# Patient Record
Sex: Male | Born: 1940 | Race: White | Hispanic: No | Marital: Married | Smoking: Former smoker
Health system: Southern US, Community
[De-identification: ages and names within clinical notes are randomized; demographics above are authoritative.]

## PROBLEM LIST (undated history)

## (undated) DIAGNOSIS — K4090 Unilateral inguinal hernia, without obstruction or gangrene, not specified as recurrent: Secondary | ICD-10-CM

## (undated) DIAGNOSIS — J40 Bronchitis, not specified as acute or chronic: Secondary | ICD-10-CM

## (undated) DIAGNOSIS — I4891 Unspecified atrial fibrillation: Secondary | ICD-10-CM

## (undated) DIAGNOSIS — R0602 Shortness of breath: Secondary | ICD-10-CM

## (undated) DIAGNOSIS — I10 Essential (primary) hypertension: Secondary | ICD-10-CM

## (undated) DIAGNOSIS — J449 Chronic obstructive pulmonary disease, unspecified: Secondary | ICD-10-CM

## (undated) DIAGNOSIS — IMO0001 Reserved for inherently not codable concepts without codable children: Secondary | ICD-10-CM

## (undated) DIAGNOSIS — M1990 Unspecified osteoarthritis, unspecified site: Secondary | ICD-10-CM

## (undated) DIAGNOSIS — Z5189 Encounter for other specified aftercare: Secondary | ICD-10-CM

## (undated) DIAGNOSIS — I714 Abdominal aortic aneurysm, without rupture, unspecified: Secondary | ICD-10-CM

## (undated) DIAGNOSIS — Z952 Presence of prosthetic heart valve: Secondary | ICD-10-CM

## (undated) DIAGNOSIS — Z9289 Personal history of other medical treatment: Secondary | ICD-10-CM

## (undated) DIAGNOSIS — I2510 Atherosclerotic heart disease of native coronary artery without angina pectoris: Secondary | ICD-10-CM

## (undated) HISTORY — PX: CHOLECYSTECTOMY: SHX55

## (undated) HISTORY — PX: GALLBLADDER SURGERY: SHX652

## (undated) HISTORY — PX: APPENDECTOMY: SHX54

## (undated) HISTORY — PX: CARDIAC VALVE REPLACEMENT: SHX585

## (undated) HISTORY — DX: Abdominal aortic aneurysm, without rupture, unspecified: I71.40

## (undated) HISTORY — PX: CARDIAC SURGERY: SHX584

## (undated) HISTORY — DX: Bronchitis, not specified as acute or chronic: J40

## (undated) HISTORY — PX: COLONOSCOPY: SHX174

## (undated) HISTORY — DX: Essential (primary) hypertension: I10

## (undated) HISTORY — PX: OTHER SURGICAL HISTORY: SHX169

## (undated) HISTORY — DX: Abdominal aortic aneurysm, without rupture: I71.4

## (undated) HISTORY — DX: Unspecified atrial fibrillation: I48.91

## (undated) HISTORY — DX: Chronic obstructive pulmonary disease, unspecified: J44.9

---

## 1983-01-19 HISTORY — PX: CORONARY ARTERY BYPASS GRAFT: SHX141

## 2005-01-18 HISTORY — PX: HERNIA REPAIR: SHX51

## 2010-12-14 ENCOUNTER — Ambulatory Visit: Payer: Self-pay

## 2011-03-24 ENCOUNTER — Encounter: Payer: Self-pay | Admitting: Cardiovascular Disease

## 2011-04-19 DIAGNOSIS — Z9289 Personal history of other medical treatment: Secondary | ICD-10-CM

## 2011-04-19 HISTORY — DX: Personal history of other medical treatment: Z92.89

## 2011-04-20 ENCOUNTER — Ambulatory Visit: Payer: Self-pay | Admitting: Cardiovascular Disease

## 2011-04-23 ENCOUNTER — Ambulatory Visit (INDEPENDENT_AMBULATORY_CARE_PROVIDER_SITE_OTHER): Payer: Medicare PPO | Admitting: Cardiovascular Disease

## 2011-04-23 ENCOUNTER — Encounter: Payer: Self-pay | Admitting: Cardiovascular Disease

## 2011-04-23 VITALS — BP 166/100 | HR 66 | Ht 70.0 in | Wt 190.0 lb

## 2011-04-23 DIAGNOSIS — Z954 Presence of other heart-valve replacement: Secondary | ICD-10-CM

## 2011-04-23 DIAGNOSIS — R0989 Other specified symptoms and signs involving the circulatory and respiratory systems: Secondary | ICD-10-CM

## 2011-04-23 DIAGNOSIS — I4891 Unspecified atrial fibrillation: Secondary | ICD-10-CM

## 2011-04-23 DIAGNOSIS — R0789 Other chest pain: Secondary | ICD-10-CM | POA: Insufficient documentation

## 2011-04-23 DIAGNOSIS — F17200 Nicotine dependence, unspecified, uncomplicated: Secondary | ICD-10-CM

## 2011-04-23 DIAGNOSIS — Z952 Presence of prosthetic heart valve: Secondary | ICD-10-CM

## 2011-04-23 NOTE — Patient Instructions (Signed)
Hold your warfarin through the weekend. Please go to Dr. Arlana Pouch on Monday for an INR check Please drop your warfarin to 6 mg everyday with 9 mg on thurs and Saturday  We have scheduled an echocardiogram for your aortic valve and atrial fib We have ordered a carotid ultrasound  Please call us if you have new issues that need to be addressed before your next appt.  Your physician wants you to follow-up in: 1 months.

## 2011-04-23 NOTE — Assessment & Plan Note (Signed)
Sharp chest pain symptoms at rest are somewhat atypical. He is at high risk of underlying coronary artery disease. We'll closely monitor him and if symptoms get worse, he may benefit from a stress test.

## 2011-04-23 NOTE — Assessment & Plan Note (Signed)
Echocardiogram ordered to evaluate his aortic valve. Initially placed in 1985. We'll try to obtain his previous echocardiogram for our records from 4 years ago.

## 2011-04-23 NOTE — Assessment & Plan Note (Signed)
Still in atrial fibrillation on today's visit despite initiation of amiodarone.. we have ordered an echocardiogram to evaluate his aortic valve, right heart pressures, mitral valve. He'll likely need cardioversion following the echocardiogram. We will set this up at his convenience.

## 2011-04-23 NOTE — Assessment & Plan Note (Signed)
We have encouraged him to continue to work on weaning his cigarettes and smoking cessation. He will continue to work on this and does not want any assistance with chantix.  

## 2011-04-23 NOTE — Assessment & Plan Note (Signed)
Mild bruit on exam. Reports having a strong family history of coronary artery disease. At his request, carotid ultrasound will be ordered.

## 2011-04-23 NOTE — Progress Notes (Signed)
Patient ID: Gregory Turner, male    DOB: 1940/03/15, 71 y.o.   MRN: [id number]  HPI Comments: 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 Dewaine Oats. He presents to establish care.  He reports that he started noticing shortness of breath at the end of February. He had some congestion, coughing. Blood pressure cuff showed his heart rate in the 130 range. He was seen by Dr. Arlana Pouch on March 6 and started on a meal around. Heart rate at that time was elevated at 132 beats per minute. Since then, he reports that he has felt relatively well. He has had improved shortness of breath, less cough. Amlodipine was changed to losartan 2 months ago and his edema has resolved. He does have continued shortness of breath now. Blood pressure at home he reports that sometimes mildly elevated but he does not remember the numbers. At Dr. Maree Krabbe office, blood pressure was in the 120-130 range. He does report having occasional atypical type sharp fleeting chest pain at rest. No significant chest pain with exertion. He is concerned about his carotid arteries reporting that a family member had blockage.  EKG today shows atrial fibrillation with rate 68 beats per minute, no significant ST or T wave changes INR today was greater than 5   Outpatient Encounter Prescriptions as of 04/23/2011  Medication Sig Dispense Refill  . amiodarone (PACERONE) 400 MG tablet Take 200 mg by mouth 2 (two) times daily.      Marland Kitchen losartan (COZAAR) 100 MG tablet Take 100 mg by mouth daily.      . metoprolol tartrate (LOPRESSOR) 25 MG tablet 1/2 tab po bid      . warfarin (COUMADIN) 6 MG tablet Take 6 mg by mouth as directed.        Review of Systems  Constitutional: Positive for fatigue.  HENT:  Negative.   Eyes: Negative.   Respiratory: Positive for cough and shortness of breath.   Cardiovascular: Negative.   Gastrointestinal: Negative.   Musculoskeletal: Negative.   Skin: Negative.   Neurological: Negative.   Hematological: Negative.   Psychiatric/Behavioral: Negative.   All other systems reviewed and are negative.    BP 166/100  Pulse 66  Ht 5\' 10"  (1.778 m)  Wt 190 lb (86.183 kg)  BMI 27.26 kg/m2  Physical Exam  Nursing note and vitals reviewed. Constitutional: He is oriented to person, place, and time. He appears well-developed and well-nourished.  HENT:  Head: Normocephalic.  Nose: Nose normal.  Mouth/Throat: Oropharynx is clear and moist.  Eyes: Conjunctivae are normal. Pupils are equal, round, and reactive to light.  Neck: Normal range of motion. Neck supple. No JVD present.  Cardiovascular: Normal rate, S1 normal, S2 normal and intact distal pulses.  An irregularly irregular rhythm present. Exam reveals no gallop and no friction rub.   Murmur heard.  Crescendo systolic murmur is present with a grade of 2/6       Audible click from his prosthetic valve  Pulmonary/Chest: Effort normal and breath sounds normal. No respiratory distress. He has no wheezes. He has no rales. He exhibits no tenderness.  Abdominal: Soft. Bowel sounds are normal. He exhibits no distension. There is no tenderness.  Musculoskeletal: Normal range of motion. He exhibits no edema and no tenderness.  Lymphadenopathy:    He has no cervical adenopathy.  Neurological: He is alert and oriented to person, place, and time. Coordination normal.  Skin: Skin is warm and dry. No rash noted. No erythema.  Psychiatric: He has a normal mood and affect. His behavior is normal. Judgment and thought content normal.           Assessment and Plan

## 2011-05-06 ENCOUNTER — Encounter (INDEPENDENT_AMBULATORY_CARE_PROVIDER_SITE_OTHER): Payer: Medicare PPO

## 2011-05-06 DIAGNOSIS — R0989 Other specified symptoms and signs involving the circulatory and respiratory systems: Secondary | ICD-10-CM

## 2011-05-06 DIAGNOSIS — I6529 Occlusion and stenosis of unspecified carotid artery: Secondary | ICD-10-CM

## 2011-05-11 ENCOUNTER — Other Ambulatory Visit: Payer: Self-pay | Admitting: Cardiology

## 2011-05-11 ENCOUNTER — Other Ambulatory Visit (INDEPENDENT_AMBULATORY_CARE_PROVIDER_SITE_OTHER): Payer: Medicare PPO

## 2011-05-11 DIAGNOSIS — R0789 Other chest pain: Secondary | ICD-10-CM

## 2011-05-11 DIAGNOSIS — I714 Abdominal aortic aneurysm, without rupture, unspecified: Secondary | ICD-10-CM

## 2011-05-11 DIAGNOSIS — I4891 Unspecified atrial fibrillation: Secondary | ICD-10-CM

## 2011-05-14 ENCOUNTER — Encounter (INDEPENDENT_AMBULATORY_CARE_PROVIDER_SITE_OTHER): Payer: Medicare PPO

## 2011-05-14 DIAGNOSIS — I714 Abdominal aortic aneurysm, without rupture, unspecified: Secondary | ICD-10-CM

## 2011-05-17 ENCOUNTER — Encounter: Payer: Self-pay | Admitting: Cardiovascular Disease

## 2011-05-17 ENCOUNTER — Ambulatory Visit (INDEPENDENT_AMBULATORY_CARE_PROVIDER_SITE_OTHER): Payer: Medicare PPO | Admitting: Cardiovascular Disease

## 2011-05-17 VITALS — BP 124/86 | HR 72 | Ht 70.0 in | Wt 189.0 lb

## 2011-05-17 DIAGNOSIS — Z952 Presence of prosthetic heart valve: Secondary | ICD-10-CM

## 2011-05-17 DIAGNOSIS — I4891 Unspecified atrial fibrillation: Secondary | ICD-10-CM

## 2011-05-17 DIAGNOSIS — I714 Abdominal aortic aneurysm, without rupture, unspecified: Secondary | ICD-10-CM

## 2011-05-17 DIAGNOSIS — F17200 Nicotine dependence, unspecified, uncomplicated: Secondary | ICD-10-CM

## 2011-05-17 DIAGNOSIS — E785 Hyperlipidemia, unspecified: Secondary | ICD-10-CM

## 2011-05-17 DIAGNOSIS — Z954 Presence of other heart-valve replacement: Secondary | ICD-10-CM

## 2011-05-17 MED ORDER — ATORVASTATIN CALCIUM 10 MG PO TABS: 10.0000 mg | ORAL_TABLET | Freq: Every day | ORAL | Status: DC

## 2011-05-17 NOTE — Assessment & Plan Note (Signed)
Recent echocardiogram shows well functioning aortic valve prosthesis.

## 2011-05-17 NOTE — Progress Notes (Signed)
Patient ID: Gregory Turner, male    DOB: 08-13-1940, 71 y.o.   MRN: [id number]  HPI Comments: 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 Dewaine Oats. On his last clinic visit we tried continuing metoprolol and amiodarone for pharmacologic cardioversion. He did not have any significant symptoms and was taking warfarin for his prosthetic mechanical valve.  He presents today and reports that overall he feels well. He denies any significant chest pain or shortness of breath. He reports that after starting amiodarone, his INR increased to 5. He was playing golf and hit the ground with his club, injuring his left forearm, with resulting hematoma. This is now resolving. Recent INR was 2.66.  Recent echocardiogram showed normal LV function, mildly dilated left atrium, normal functioning aortic valve mechanical valve. Incidentally, infrarenal abdominal aortic aneurysm was found estimated at 5.9 cm. He has a strong family history of aneurysm, with a brother who had AAA rupture, mother with AAA. Most of the family smokes.  No known cardiac history apart from recent arrhythmia. Blood pressure has improved on his current medication regimen.  EKG today shows atrial fibrillation with rate 71 beats per minute, no significant ST or T wave changes   Outpatient Encounter Prescriptions as of 05/17/2011  Medication Sig Dispense Refill  . amiodarone (PACERONE) 400 MG tablet Take 200 mg by mouth 2 (two) times daily.      Marland Kitchen losartan (COZAAR) 100 MG tablet Take 100 mg by mouth daily.      . metoprolol tartrate (LOPRESSOR) 25 MG tablet 1/2 tab po bid      . warfarin (COUMADIN) 6 MG tablet Take 6 mg by mouth as directed.       Review of  Systems  Constitutional: Positive for fatigue.  HENT: Negative.   Eyes: Negative.   Cardiovascular: Negative.   Gastrointestinal: Negative.   Musculoskeletal: Negative.   Skin: Negative.   Neurological: Negative.   Hematological: Negative.   Psychiatric/Behavioral: Negative.   All other systems reviewed and are negative.    BP 124/86  Pulse 72  Ht 5\' 10"  (1.778 m)  Wt 189 lb (85.73 kg)  BMI 27.12 kg/m2  Physical Exam  Nursing note and vitals reviewed. Constitutional: He is oriented to person, place, and time. He appears well-developed and well-nourished.  HENT:  Head: Normocephalic.  Nose: Nose normal.  Mouth/Throat: Oropharynx is clear and moist.  Eyes: Conjunctivae are normal. Pupils are equal, round, and reactive to light.  Neck: Normal range of motion. Neck supple. No JVD present.  Cardiovascular: Normal rate, S1 normal, S2 normal and intact distal pulses.  An irregularly irregular rhythm present. Exam reveals no gallop and no friction rub.   Murmur heard.  Crescendo systolic murmur is present with a grade of 2/6       Audible click from his prosthetic valve  Pulmonary/Chest: Effort normal and breath sounds normal. No respiratory distress. He has no wheezes. He has no rales. He exhibits no tenderness.  Abdominal: Soft. Bowel sounds are normal. He exhibits no distension. There is no tenderness.  Musculoskeletal: Normal range of motion. He exhibits no edema and no tenderness.  Lymphadenopathy:    He has no cervical adenopathy.  Neurological: He is alert and oriented  to person, place, and time. Coordination normal.  Skin: Skin is warm and dry. No rash noted. No erythema.  Psychiatric: He has a normal mood and affect. His behavior is normal. Judgment and thought content normal.           Assessment and Plan         

## 2011-05-17 NOTE — Assessment & Plan Note (Signed)
We have encouraged him to continue to work on weaning his cigarettes and smoking cessation. He will continue to work on this and does not want any assistance with chantix.  

## 2011-05-17 NOTE — Assessment & Plan Note (Signed)
We will start Lipitor 10 mg daily. Goal LDL less than 70.

## 2011-05-17 NOTE — Patient Instructions (Signed)
Please start lipitor 10 mg once a day We have scheduled an appt with Dr. Myra Gianotti in [place] for your ABD aortic aneurysm  Please call us if you have new issues that need to be addressed before your next appt.  Your physician wants you to follow-up in: 2 months.

## 2011-05-17 NOTE — Assessment & Plan Note (Addendum)
Infrarenal abdominal aortic aneurysm estimated close to 6 cm. He is very concerned given his strong family history of AAA, brother with aneurysm rupture who lived. We will refer him to vascular surgery for further evaluation. He has indicated he would like to be seen in [place] . He does have a history of surgery to the abdomen, likely significant scar tissue. Uncertain if he is a candidate for endovascular stenting. We'll defer on ordering a abdomen and pelvis CT scan to evaluate the AAA and will let vascular surgery order this in [place] (uncertain if high-resolution 1 mm cuts are needed).

## 2011-05-17 NOTE — Assessment & Plan Note (Signed)
Rate is well controlled. Appears to be atrial flutter. We'll continue him on his current medication regimen and consider cardioversion after his abdominal aortic aneurysm repair. He would like to wait until after the aneurysm has been repaired.

## 2011-05-18 ENCOUNTER — Encounter: Payer: Self-pay | Admitting: Vascular Surgery

## 2011-05-18 ENCOUNTER — Other Ambulatory Visit: Payer: Self-pay | Admitting: Cardiovascular Disease

## 2011-05-18 DIAGNOSIS — I714 Abdominal aortic aneurysm, without rupture, unspecified: Secondary | ICD-10-CM

## 2011-05-19 ENCOUNTER — Encounter: Payer: Self-pay | Admitting: Vascular Surgery

## 2011-05-19 ENCOUNTER — Encounter: Payer: Self-pay | Admitting: Cardiovascular Disease

## 2011-05-19 ENCOUNTER — Ambulatory Visit (INDEPENDENT_AMBULATORY_CARE_PROVIDER_SITE_OTHER): Payer: Medicare PPO | Admitting: Vascular Surgery

## 2011-05-19 ENCOUNTER — Encounter (HOSPITAL_COMMUNITY): Payer: Self-pay | Admitting: Pharmacy Technician

## 2011-05-19 ENCOUNTER — Other Ambulatory Visit: Payer: Self-pay

## 2011-05-19 VITALS — BP 142/93 | HR 69 | Temp 98.1°F | Ht 70.0 in | Wt 191.0 lb

## 2011-05-19 DIAGNOSIS — I714 Abdominal aortic aneurysm, without rupture, unspecified: Secondary | ICD-10-CM | POA: Insufficient documentation
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
## 2011-05-23 MED ORDER — SODIUM CHLORIDE 0.9 % IV SOLN: INTRAVENOUS | Status: DC

## 2011-05-24 ENCOUNTER — Ambulatory Visit (HOSPITAL_COMMUNITY)
Admission: RE | Admit: 2011-05-24 | Discharge: 2011-05-24 | Disposition: A | Discharge status: Home / Self Care | Payer: Medicare PPO | Source: Ambulatory Visit | Attending: Vascular Surgery | Admitting: Vascular Surgery

## 2011-05-24 ENCOUNTER — Encounter (HOSPITAL_COMMUNITY)
Admission: RE | Disposition: A | Discharge status: Home / Self Care | Payer: Self-pay | Source: Ambulatory Visit | Attending: Vascular Surgery

## 2011-05-24 ENCOUNTER — Ambulatory Visit: Payer: Medicare PPO | Admitting: Cardiovascular Disease

## 2011-05-24 ENCOUNTER — Other Ambulatory Visit: Payer: Self-pay | Admitting: *Deleted

## 2011-05-24 DIAGNOSIS — I714 Abdominal aortic aneurysm, without rupture, unspecified: Secondary | ICD-10-CM | POA: Insufficient documentation

## 2011-05-24 DIAGNOSIS — R791 Abnormal coagulation profile: Secondary | ICD-10-CM | POA: Insufficient documentation

## 2011-05-24 DIAGNOSIS — Z5309 Procedure and treatment not carried out because of other contraindication: Secondary | ICD-10-CM | POA: Insufficient documentation

## 2011-05-24 LAB — PROTIME-INR
INR: 2.03 — ABNORMAL HIGH (ref 0.00–1.49)
Prothrombin Time: 23.3 seconds — ABNORMAL HIGH (ref 11.6–15.2)

## 2011-05-24 LAB — POCT I-STAT, CHEM 8
Calcium, Ion: 1.2 mmol/L (ref 1.12–1.32)
Chloride: 106 mEq/L (ref 96–112)
Glucose, Bld: 103 mg/dL — ABNORMAL HIGH (ref 70–99)
HCT: 43 % (ref 39.0–52.0)
Hemoglobin: 14.6 g/dL (ref 13.0–17.0)

## 2011-05-24 LAB — APTT: aPTT: 46 seconds — ABNORMAL HIGH (ref 24–37)

## 2011-05-24 SURGERY — ABDOMINAL AORTAGRAM
Anesthesia: LOCAL

## 2011-05-24 NOTE — Progress Notes (Signed)
Pt's surgery has ben cancelled today due to high INR.  Pt has talked with cath Lab Rn and Dr Edilia Bo will reschedule his procedure for a later date.  PTs IV was removed pt and his wife left

## 2011-05-24 NOTE — Progress Notes (Signed)
Notified Dr Edilia Bo that patient's INR 2.03, last Coumadin dose was 1/2 dose on 05/20/2011, procedure cancelled per Dr Edilia Bo, instructed patient to resume coumadin today and physician office will call to reschedule.

## 2011-05-25 ENCOUNTER — Encounter (HOSPITAL_COMMUNITY): Payer: Self-pay | Admitting: Respiratory Therapy

## 2011-05-26 ENCOUNTER — Ambulatory Visit (HOSPITAL_COMMUNITY): Admission: RE | Admit: 2011-05-26 | Payer: Medicare PPO | Source: Ambulatory Visit | Admitting: Vascular Surgery

## 2011-05-26 ENCOUNTER — Other Ambulatory Visit: Payer: Self-pay | Admitting: *Deleted

## 2011-05-26 ENCOUNTER — Encounter (HOSPITAL_COMMUNITY): Admission: RE | Payer: Self-pay | Source: Ambulatory Visit

## 2011-05-26 SURGERY — ABDOMINAL AORTAGRAM
Anesthesia: LOCAL
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
## 2011-05-26 NOTE — Interval H&P Note (Signed)
History and Physical Interval Note:  05/26/2011 10:48 AM  Gregory Turner  has presented today for surgery, with the diagnosis of pvd  The various methods of treatment have been discussed with the patient and family. After consideration of risks, benefits and other options for treatment, the patient has consented to  Procedure(s) (LRB): ABDOMINAL AORTAGRAM (N/A) as a surgical intervention .  The patients' history has been reviewed, patient examined, no change in status, stable for surgery.  I have reviewed the patients' chart and labs.  Questions were answered to the patient's satisfaction.     [name]

## 2011-05-28 ENCOUNTER — Ambulatory Visit (HOSPITAL_COMMUNITY)
Admission: RE | Admit: 2011-05-28 | Discharge: 2011-05-28 | Disposition: A | Discharge status: Home / Self Care | Payer: Medicare PPO | Source: Ambulatory Visit | Attending: Vascular Surgery | Admitting: Vascular Surgery

## 2011-05-28 ENCOUNTER — Encounter (HOSPITAL_COMMUNITY)
Admission: RE | Disposition: A | Discharge status: Home / Self Care | Payer: Self-pay | Source: Ambulatory Visit | Attending: Vascular Surgery

## 2011-05-28 DIAGNOSIS — F17200 Nicotine dependence, unspecified, uncomplicated: Secondary | ICD-10-CM | POA: Insufficient documentation

## 2011-05-28 DIAGNOSIS — Z7901 Long term (current) use of anticoagulants: Secondary | ICD-10-CM | POA: Insufficient documentation

## 2011-05-28 DIAGNOSIS — Z79899 Other long term (current) drug therapy: Secondary | ICD-10-CM | POA: Insufficient documentation

## 2011-05-28 DIAGNOSIS — I714 Abdominal aortic aneurysm, without rupture, unspecified: Secondary | ICD-10-CM

## 2011-05-28 DIAGNOSIS — Z954 Presence of other heart-valve replacement: Secondary | ICD-10-CM | POA: Insufficient documentation

## 2011-05-28 DIAGNOSIS — I10 Essential (primary) hypertension: Secondary | ICD-10-CM | POA: Insufficient documentation

## 2011-05-28 DIAGNOSIS — I4891 Unspecified atrial fibrillation: Secondary | ICD-10-CM | POA: Insufficient documentation

## 2011-05-28 DIAGNOSIS — J4489 Other specified chronic obstructive pulmonary disease: Secondary | ICD-10-CM | POA: Insufficient documentation

## 2011-05-28 DIAGNOSIS — J449 Chronic obstructive pulmonary disease, unspecified: Secondary | ICD-10-CM | POA: Insufficient documentation

## 2011-05-28 HISTORY — PX: ABDOMINAL AORTAGRAM: SHX5454

## 2011-05-28 LAB — POCT I-STAT, CHEM 8
BUN: 17 mg/dL (ref 6–23)
Calcium, Ion: 1.17 mmol/L (ref 1.12–1.32)
Creatinine, Ser: 1.2 mg/dL (ref 0.50–1.35)
Glucose, Bld: 101 mg/dL — ABNORMAL HIGH (ref 70–99)
Hemoglobin: 15 g/dL (ref 13.0–17.0)
Sodium: 139 mEq/L (ref 135–145)
TCO2: 23 mmol/L (ref 0–100)

## 2011-05-28 LAB — APTT: aPTT: 35 seconds (ref 24–37)

## 2011-05-28 SURGERY — ABDOMINAL AORTAGRAM
Anesthesia: LOCAL

## 2011-05-28 MED ORDER — ONDANSETRON HCL 4 MG/2ML IJ SOLN: 4.0000 mg | Freq: Four times a day (QID) | INTRAMUSCULAR | Status: DC | PRN

## 2011-05-28 MED ORDER — ACETAMINOPHEN 325 MG PO TABS: 650.0000 mg | ORAL_TABLET | ORAL | Status: DC | PRN

## 2011-05-28 MED ORDER — MIDAZOLAM HCL 2 MG/2ML IJ SOLN
INTRAMUSCULAR | Status: AC
  Filled 2011-05-28: qty 2

## 2011-05-28 MED ORDER — LIDOCAINE HCL (PF) 1 % IJ SOLN
INTRAMUSCULAR | Status: AC
  Filled 2011-05-28: qty 30

## 2011-05-28 MED ORDER — SODIUM CHLORIDE 0.9 % IV SOLN
INTRAVENOUS | Status: DC
  Administered 2011-05-28: 07:00:00 via INTRAVENOUS

## 2011-05-28 MED ORDER — HEPARIN (PORCINE) IN NACL 2-0.9 UNIT/ML-% IJ SOLN
INTRAMUSCULAR | Status: AC
  Filled 2011-05-28: qty 1000

## 2011-05-28 MED ORDER — SODIUM CHLORIDE 0.9 % IV SOLN: 1.0000 mL/kg/h | INTRAVENOUS | Status: DC

## 2011-05-28 MED ORDER — FENTANYL CITRATE 0.05 MG/ML IJ SOLN
INTRAMUSCULAR | Status: AC
  Filled 2011-05-28: qty 2

## 2011-05-28 NOTE — Op Note (Signed)
PATIENT: Gregory Turner   MRN: [id number] DOB: 09-18-1940    DATE OF PROCEDURE: 05/28/2011  INDICATIONS: Gregory Turner is a 71 y.o. male 5.5 cm infrarenal abdominal aortic aneurysm. He is brought in for diagnostic arteriography in order to plan elective repair.  PROCEDURE:  1. Ultrasound-guided access to the right common femoral artery 2. Aortogram with bilateral iliac arteriogram and bilateral lower extremity runoff 3. Perclose right common femoral artery  SURGEON: Di Kindle. Edilia Bo, MD, FACS  ANESTHESIA: local with sedation   EBL: minimal  TECHNIQUE: The patient was brought to the PV lap and received 1 mg of Versed and 50 mcg of fentanyl. The right groin was prepped and draped in the usual sterile fashion. After the skin was infiltrated with 1% lidocaine, and under ultrasound guidance, the right common femoral artery was cannulated and a guidewire introduced into the infrarenal aorta under fluoroscopic control. A 5 French sheath was introduced over the wire. The pigtail catheter was positioned at the L1 vertebral body and flush aortogram obtained. A lateral projection was then obtained. The catheter was then pulled down above the aortic bifurcation and oblique iliac projections were obtained. Next bilateral lower extremity runoff films were obtained.  At the completion the 5 French sheath was exchanged for the Perclose device. The artery was closed using the Perclose device with good hemostasis noted. No immediate complications were noted.  FINDINGS:  1. Infrarenal abdominal aortic aneurysm (the size of which cannot be determined by this study). No significant renal artery disease or disease of the superior mesenteric or celiac axis. 2. Patent common iliac, external iliac arteries, and hypogastric arteries bilaterally. 3. Patent common femoral, deep femoral, superficial femoral, popliteal, posterior tibial, and peroneal arteries bilaterally. Anterior tibial arteries are occluded  bilaterally.  Waverly Ferrari, MD, FACS [facility]  DATE OF DICTATION:   05/28/2011

## 2011-05-28 NOTE — Progress Notes (Signed)
Pt walked in hallway without difficulty. Right groin dressing remains CDI, site unremarkable.

## 2011-05-28 NOTE — Interval H&P Note (Signed)
History and Physical Interval Note:  05/28/2011 1:49 PM  Gregory Turner  has presented today for surgery, with the diagnosis of Abdominal Aneursym  The various methods of treatment have been discussed with the patient and family. After consideration of risks, benefits and other options for treatment, the patient has consented to: ABDOMINAL AORTAGRAM (N/A) as a surgical intervention .  The patients' history has been reviewed, patient examined, no change in status, stable for surgery.  I have reviewed the patients' chart and labs.  Questions were answered to the patient's satisfaction.     Lakhia Gengler S

## 2011-05-28 NOTE — Discharge Instructions (Signed)
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
## 2011-06-01 ENCOUNTER — Encounter: Payer: Self-pay | Admitting: Vascular Surgery

## 2011-06-02 ENCOUNTER — Ambulatory Visit (INDEPENDENT_AMBULATORY_CARE_PROVIDER_SITE_OTHER): Payer: Medicare PPO | Admitting: Vascular Surgery

## 2011-06-02 ENCOUNTER — Encounter: Payer: Self-pay | Admitting: Vascular Surgery

## 2011-06-02 VITALS — BP 165/101 | HR 53 | Resp 18 | Ht 70.0 in | Wt 190.0 lb

## 2011-06-02 DIAGNOSIS — I714 Abdominal aortic aneurysm, without rupture, unspecified: Secondary | ICD-10-CM

## 2011-06-02 NOTE — Progress Notes (Signed)
Vascular and Vein Specialist of [place]  Patient name: Gregory Turner MRN: [id number] DOB: 04/26/1940 Sex: male  REASON FOR ADMISSION: for elective repair of 5.9 cm infrarenal abdominal aortic aneurysm.  HPI: Gregory Turner is a 71 y.o. male who was recently diagnosed with atrial fibrillation. His workup included an ultrasound of the abdomen which showed a 5.9 cm infrarenal abdominal aortic aneurysm. He was sent for vascular consultation area Dr. Gollan planned on holding off on and at lesion procedure for his atrial fibrillation and so the aneurysm was addressed. CT scan done in  [place] showed a 5.5 cm infrarenal abdominal aortic aneurysm. Patient has not had any abdominal pain or back pain. He has undergone workup including an aortogram which shows he appears to be a reasonable candidate for endovascular repair of his aneurysm.  Past Medical History  Diagnosis Date  . HTN (hypertension)   . COPD (chronic obstructive pulmonary disease)   . Bronchitis   . Afib     Family History  Problem Relation Age of Onset  . Hypertension Mother   . Cancer Brother     lung  . Deep vein thrombosis Brother   . Other Brother     AAA  . Heart disease Father   . Heart attack Father     SOCIAL HISTORY: History  Substance Use Topics  . Smoking status: Current Everyday Smoker -- 1.0 packs/day for 53 years    Types: Cigarettes  . Smokeless tobacco: Never Used  . Alcohol Use: No    Allergies  Allergen Reactions  . Sulfa Drugs Cross Reactors Other (See Comments)    Makes patient feel faint    Current Outpatient Prescriptions  Medication Sig Dispense Refill  . acetaminophen (TYLENOL) 500 MG tablet Take 500 mg by mouth every 6 (six) hours as needed. For pain      . albuterol (PROVENTIL HFA;VENTOLIN HFA) 108 (90 BASE) MCG/ACT inhaler Inhale 2 puffs into the lungs every 6 (six) hours as needed. For shortness of breath      . amiodarone (PACERONE) 200 MG tablet Take 100 mg by  mouth 2 (two) times daily.      . atorvastatin (LIPITOR) 10 MG tablet Take 10 mg by mouth daily.      . losartan (COZAAR) 100 MG tablet Take 100 mg by mouth daily.      . metoprolol tartrate (LOPRESSOR) 25 MG tablet Take 12.5 mg by mouth 2 (two) times daily.       . warfarin (COUMADIN) 6 MG tablet Take 6-9 mg by mouth daily. Take 6mg on day 1, 9mg on day 2, 6mg on day 3 and continue accordingly        REVIEW OF SYSTEMS: [X ] denotes positive finding; [  ] denotes negative finding CARDIOVASCULAR:  [ ] chest pain   [ ] chest pressure   [ ] palpitations   [ ] orthopnea   [ ] dyspnea on exertion   [ ] claudication   [ ] rest pain   [ ] DVT   [ ] phlebitis PULMONARY:   [ ] productive cough   [ ] asthma   [ ] wheezing NEUROLOGIC:   [ ] weakness  [ ] paresthesias  [ ] aphasia  [ ] amaurosis  [ ] dizziness HEMATOLOGIC:   [ ] bleeding problems   [ ] clotting disorders MUSCULOSKELETAL:  [ ] joint pain   [ ] joint swelling [ ] leg swelling GASTROINTESTINAL: [ ]  blood in stool  [ ]    hematemesis GENITOURINARY:  [ ]  dysuria  [ ]  hematuria PSYCHIATRIC:  [ ] history of major depression INTEGUMENTARY:  [ ] rashes  [ ] ulcers CONSTITUTIONAL:  [ ] fever   [ ] chills  PHYSICAL EXAM: Filed Vitals:   06/02/11 1034  BP: 165/101  Pulse: 53  Resp: 18  Height: 5' 10" (1.778 m)  Weight: 190 lb (86.183 kg)   Body mass index is 27.26 kg/(m^2). GENERAL: The patient is a well-nourished male, in no acute distress. The vital signs are documented above. CARDIOVASCULAR: There is a regular rate and rhythm without significant murmur appreciated. I do not detect carotid bruits. He has palpable femoral, popliteal, and posterior tibial pulses bilaterally. He has no significant lower extremity swelling. PULMONARY: There is good air exchange bilaterally without wheezing or rales. ABDOMEN: Soft and non-tender with normal pitched bowel sounds. His aneurysm is palpable and nontender. MUSCULOSKELETAL: There are no major  deformities or cyanosis. NEUROLOGIC: No focal weakness or paresthesias are detected. SKIN: There are no ulcers or rashes noted. PSYCHIATRIC: The patient has a normal affect.  DATA:  I have reviewed his CT scan which shows a 5.4 cm infrarenal abdominal aortic aneurysm. The neck of the aneurysm is approximately 30 mm in diameter a centimeter and a half distal to the lowest renal on the left. I've also reviewed his arteriogram which shows it appears to be a reasonable candidate for EVAR.  MEDICAL ISSUES: He presents for elective EVAR of his 5.5 cm infrarenal abdominal aortic aneurysm. I have discussed the indications for aneurysm repair. I have explained that without repair, the risk of the aneurysm rupturing is 5-10% per year. We have discussed the advantages and disadvantages of open versus endovascular repair. The patient wishes to proceed with endovascular aneurysm repair (EVAR). I have discussed the potential complications of EVAR, including, but not limited to: bleeding, infection, arterial injury, graft migration, endoleak, renal failure, MI or other unpredictable medical problems. We have discussed the possibility of having to convert to open repair. We also discussed the need for continued lifelong follow-up after EVAR. All of the patients questions were answered and they are agreeable to proceed with surgery. We will schedule his surgery within the next few weeks. His Coumadin will be held preoperatively.  Lowry Bala S [facility] [place] Office: [phone]  

## 2011-06-07 ENCOUNTER — Other Ambulatory Visit: Payer: Self-pay | Admitting: *Deleted

## 2011-06-09 ENCOUNTER — Encounter (HOSPITAL_COMMUNITY): Payer: Self-pay | Admitting: Pharmacy Technician

## 2011-06-15 ENCOUNTER — Inpatient Hospital Stay (HOSPITAL_COMMUNITY): Admission: RE | Admit: 2011-06-15 | Discharge: 2011-06-15 | Payer: Medicare PPO | Source: Ambulatory Visit

## 2011-06-15 ENCOUNTER — Encounter (HOSPITAL_COMMUNITY): Payer: Self-pay

## 2011-06-15 HISTORY — DX: Encounter for other specified aftercare: Z51.89

## 2011-06-15 HISTORY — DX: Shortness of breath: R06.02

## 2011-06-15 HISTORY — DX: Unspecified osteoarthritis, unspecified site: M19.90

## 2011-06-15 HISTORY — DX: Personal history of other medical treatment: Z92.89

## 2011-06-15 HISTORY — DX: Presence of prosthetic heart valve: Z95.2

## 2011-06-15 HISTORY — DX: Unilateral inguinal hernia, without obstruction or gangrene, not specified as recurrent: K40.90

## 2011-06-15 HISTORY — DX: Reserved for inherently not codable concepts without codable children: IMO0001

## 2011-06-15 NOTE — Pre-Procedure Instructions (Signed)
20 Charley Miske  06/15/2011   Your procedure is scheduled on:  06/22/2011  Report to [facility] at 5:30 AM.  Call this number if you have problems the morning of surgery: [phone]   Remember:   Do not eat food:After Midnight.  May have clear liquids: up to 4 Hours before arrival.  NOTHING AFTER 1:30a.m.  Clear liquids include soda, tea, black coffee, apple or grape juice, broth.  Take these medicines the morning of surgery with A SIP OF WATER: amiodarone, metoprolol    Do not wear jewelry, make-up or nail polish.  Do not wear lotions, powders, or perfumes. You may wear deodorant.  Do not shave 48 hours prior to surgery. Men may shave face and neck.  Do not bring valuables to the hospital.  Contacts, dentures or bridgework may not be worn into surgery.  Leave suitcase in the car. After surgery it may be brought to your room.  For patients admitted to the hospital, checkout time is 11:00 AM the day of discharge.   Patients discharged the day of surgery will not be allowed to drive home.  Name and phone number of your driver: /w wife   Special Instructions: CHG Shower Use Special Wash: 1/2 bottle night before surgery and 1/2 bottle morning of surgery.   Please read over the following fact sheets that you were given: Pain Booklet, Coughing and Deep Breathing, Blood Transfusion Information, MRSA Information and Surgical Site Infection Prevention

## 2011-06-17 ENCOUNTER — Encounter (HOSPITAL_COMMUNITY)
Admission: RE | Admit: 2011-06-17 | Discharge: 2011-06-17 | Disposition: A | Discharge status: Home / Self Care | Payer: Medicare PPO | Source: Ambulatory Visit | Attending: Vascular Surgery | Admitting: Vascular Surgery

## 2011-06-17 LAB — URINE MICROSCOPIC-ADD ON

## 2011-06-17 LAB — BLOOD GAS, ARTERIAL
Bicarbonate: 22.2 mEq/L (ref 20.0–24.0)
FIO2: 0.21 %
O2 Saturation: 97.4 %
TCO2: 23.3 mmol/L (ref 0–100)
pH, Arterial: 7.407 (ref 7.350–7.450)
pO2, Arterial: 88 mmHg (ref 80.0–100.0)

## 2011-06-17 LAB — CBC
HCT: 42 % (ref 39.0–52.0)
Hemoglobin: 14.4 g/dL (ref 13.0–17.0)
RBC: 4.44 MIL/uL (ref 4.22–5.81)

## 2011-06-17 LAB — COMPREHENSIVE METABOLIC PANEL
ALT: 12 U/L (ref 0–53)
Alkaline Phosphatase: 71 U/L (ref 39–117)
BUN: 14 mg/dL (ref 6–23)
CO2: 20 mEq/L (ref 19–32)
GFR calc Af Amer: >90 mL/min (ref >90)
GFR calc non Af Amer: 81 mL/min — ABNORMAL LOW (ref >90)
Glucose, Bld: 96 mg/dL (ref 70–99)
Potassium: 4.4 mEq/L (ref 3.5–5.1)
Sodium: 137 mEq/L (ref 135–145)
Total Bilirubin: 0.5 mg/dL (ref 0.3–1.2)

## 2011-06-17 LAB — URINALYSIS, ROUTINE W REFLEX MICROSCOPIC
Bilirubin Urine: NEGATIVE
Glucose, UA: NEGATIVE mg/dL
Ketones, ur: NEGATIVE mg/dL
Leukocytes, UA: NEGATIVE
pH: 5.5 (ref 5.0–8.0)

## 2011-06-17 LAB — SURGICAL PCR SCREEN: Staphylococcus aureus: NEGATIVE

## 2011-06-17 LAB — TYPE AND SCREEN: ABO/RH(D): B POS

## 2011-06-21 ENCOUNTER — Other Ambulatory Visit: Payer: Self-pay | Admitting: Vascular Surgery

## 2011-06-21 LAB — PROTIME-INR: Prothrombin Time: 16.1 seconds — ABNORMAL HIGH (ref 11.6–15.2)

## 2011-06-21 MED ORDER — SODIUM CHLORIDE 0.9 % IV SOLN: INTRAVENOUS | Status: DC

## 2011-06-21 MED ORDER — DEXTROSE 5 % IV SOLN
1.5000 g | INTRAVENOUS | Status: AC
  Administered 2011-06-22: 1.5 g via INTRAVENOUS
  Filled 2011-06-21: qty 1.5

## 2011-06-22 ENCOUNTER — Ambulatory Visit (HOSPITAL_COMMUNITY): Payer: Medicare PPO

## 2011-06-22 ENCOUNTER — Encounter (HOSPITAL_COMMUNITY): Payer: Self-pay | Admitting: Anesthesiology

## 2011-06-22 ENCOUNTER — Inpatient Hospital Stay (HOSPITAL_COMMUNITY): Payer: Medicare PPO

## 2011-06-22 ENCOUNTER — Inpatient Hospital Stay (HOSPITAL_COMMUNITY)
Admission: RE | Admit: 2011-06-22 | Discharge: 2011-06-23 | DRG: 238 | Disposition: A | Discharge status: Home / Self Care | Payer: Medicare PPO | Source: Ambulatory Visit | Attending: Vascular Surgery | Admitting: Vascular Surgery

## 2011-06-22 ENCOUNTER — Encounter (HOSPITAL_COMMUNITY): Payer: Self-pay | Admitting: Surgery

## 2011-06-22 ENCOUNTER — Ambulatory Visit (HOSPITAL_COMMUNITY): Payer: Medicare PPO | Admitting: Anesthesiology

## 2011-06-22 ENCOUNTER — Encounter (HOSPITAL_COMMUNITY)
Admission: RE | Disposition: A | Discharge status: Home / Self Care | Payer: Self-pay | Source: Ambulatory Visit | Attending: Vascular Surgery

## 2011-06-22 DIAGNOSIS — Z01812 Encounter for preprocedural laboratory examination: Secondary | ICD-10-CM

## 2011-06-22 DIAGNOSIS — I714 Abdominal aortic aneurysm, without rupture, unspecified: Principal | ICD-10-CM | POA: Diagnosis present

## 2011-06-22 DIAGNOSIS — I4891 Unspecified atrial fibrillation: Secondary | ICD-10-CM | POA: Diagnosis present

## 2011-06-22 DIAGNOSIS — J4489 Other specified chronic obstructive pulmonary disease: Secondary | ICD-10-CM | POA: Diagnosis present

## 2011-06-22 DIAGNOSIS — I10 Essential (primary) hypertension: Secondary | ICD-10-CM | POA: Diagnosis present

## 2011-06-22 DIAGNOSIS — F17200 Nicotine dependence, unspecified, uncomplicated: Secondary | ICD-10-CM | POA: Diagnosis present

## 2011-06-22 DIAGNOSIS — J449 Chronic obstructive pulmonary disease, unspecified: Secondary | ICD-10-CM | POA: Diagnosis present

## 2011-06-22 DIAGNOSIS — Z954 Presence of other heart-valve replacement: Secondary | ICD-10-CM

## 2011-06-22 HISTORY — PX: ABDOMINAL AORTIC ANEURYSM REPAIR: SUR1152

## 2011-06-22 HISTORY — PX: ENDOVASCULAR STENT INSERTION: SHX5161

## 2011-06-22 LAB — BASIC METABOLIC PANEL
BUN: 10 mg/dL (ref 6–23)
CO2: 24 mEq/L (ref 19–32)
Calcium: 8.2 mg/dL — ABNORMAL LOW (ref 8.4–10.5)
Creatinine, Ser: 0.77 mg/dL (ref 0.50–1.35)
Glucose, Bld: 108 mg/dL — ABNORMAL HIGH (ref 70–99)
Sodium: 136 mEq/L (ref 135–145)

## 2011-06-22 LAB — CBC
HCT: 38.6 % — ABNORMAL LOW (ref 39.0–52.0)
Hemoglobin: 13 g/dL (ref 13.0–17.0)
MCH: 32 pg (ref 26.0–34.0)
MCV: 95.1 fL (ref 78.0–100.0)
RBC: 4.06 MIL/uL — ABNORMAL LOW (ref 4.22–5.81)

## 2011-06-22 LAB — PROTIME-INR: INR: 1.26 (ref 0.00–1.49)

## 2011-06-22 SURGERY — ENDOVASCULAR STENT GRAFT INSERTION
Anesthesia: General | Wound class: Clean

## 2011-06-22 MED ORDER — ONDANSETRON HCL 4 MG/2ML IJ SOLN: 4.0000 mg | Freq: Four times a day (QID) | INTRAMUSCULAR | Status: DC | PRN

## 2011-06-22 MED ORDER — PROPOFOL 10 MG/ML IV EMUL
INTRAVENOUS | Status: DC | PRN
  Administered 2011-06-22: 180 mg via INTRAVENOUS

## 2011-06-22 MED ORDER — WARFARIN SODIUM 6 MG PO TABS: 6.0000 mg | ORAL_TABLET | Freq: Every day | ORAL | Status: DC

## 2011-06-22 MED ORDER — ONDANSETRON HCL 4 MG/2ML IJ SOLN
INTRAMUSCULAR | Status: DC | PRN
  Administered 2011-06-22: 4 mg via INTRAVENOUS

## 2011-06-22 MED ORDER — HYDRALAZINE HCL 20 MG/ML IJ SOLN: 10.0000 mg | INTRAMUSCULAR | Status: DC | PRN

## 2011-06-22 MED ORDER — MORPHINE SULFATE 2 MG/ML IJ SOLN: 2.0000 mg | INTRAMUSCULAR | Status: DC | PRN

## 2011-06-22 MED ORDER — PHENYLEPHRINE HCL 10 MG/ML IJ SOLN
10.0000 mg | INTRAVENOUS | Status: DC | PRN
  Administered 2011-06-22: 10 ug/min via INTRAVENOUS

## 2011-06-22 MED ORDER — POTASSIUM CHLORIDE IN NACL 20-0.9 MEQ/L-% IV SOLN
INTRAVENOUS | Status: DC
  Administered 2011-06-22: 100 mL via INTRAVENOUS
  Filled 2011-06-22 (×4): qty 1000

## 2011-06-22 MED ORDER — HYDROMORPHONE HCL PF 1 MG/ML IJ SOLN
INTRAMUSCULAR | Status: AC
  Filled 2011-06-22: qty 1

## 2011-06-22 MED ORDER — DOPAMINE-DEXTROSE 3.2-5 MG/ML-% IV SOLN: 3.0000 ug/kg/min | INTRAVENOUS | Status: DC

## 2011-06-22 MED ORDER — ACETAMINOPHEN 500 MG PO TABS: 500.0000 mg | ORAL_TABLET | Freq: Four times a day (QID) | ORAL | Status: DC | PRN

## 2011-06-22 MED ORDER — IODIXANOL 320 MG/ML IV SOLN
INTRAVENOUS | Status: DC | PRN
  Administered 2011-06-22: 150 mL via INTRA_ARTERIAL

## 2011-06-22 MED ORDER — FLUOCINONIDE 0.05 % EX CREA
TOPICAL_CREAM | Freq: Two times a day (BID) | CUTANEOUS | Status: DC
  Administered 2011-06-23: 11:00:00 via TOPICAL
  Filled 2011-06-22: qty 30

## 2011-06-22 MED ORDER — POTASSIUM CHLORIDE CRYS ER 20 MEQ PO TBCR: 20.0000 meq | EXTENDED_RELEASE_TABLET | Freq: Once | ORAL | Status: AC | PRN

## 2011-06-22 MED ORDER — MAGNESIUM SULFATE 40 MG/ML IJ SOLN
2.0000 g | Freq: Once | INTRAMUSCULAR | Status: AC | PRN
  Filled 2011-06-22: qty 50

## 2011-06-22 MED ORDER — ONDANSETRON HCL 4 MG/2ML IJ SOLN: 4.0000 mg | Freq: Once | INTRAMUSCULAR | Status: DC | PRN

## 2011-06-22 MED ORDER — PHENOL 1.4 % MT LIQD: 1.0000 | OROMUCOSAL | Status: DC | PRN

## 2011-06-22 MED ORDER — WARFARIN SODIUM 6 MG PO TABS
9.0000 mg | ORAL_TABLET | ORAL | Status: DC
  Administered 2011-06-22: 9 mg via ORAL
  Filled 2011-06-22: qty 1

## 2011-06-22 MED ORDER — OXYCODONE-ACETAMINOPHEN 5-325 MG PO TABS
1.0000 | ORAL_TABLET | ORAL | Status: DC | PRN
  Administered 2011-06-22: 2 via ORAL
  Filled 2011-06-22: qty 2

## 2011-06-22 MED ORDER — ALBUTEROL SULFATE HFA 108 (90 BASE) MCG/ACT IN AERS
2.0000 | INHALATION_SPRAY | Freq: Four times a day (QID) | RESPIRATORY_TRACT | Status: DC | PRN
  Filled 2011-06-22: qty 6.7

## 2011-06-22 MED ORDER — GLYCOPYRROLATE 0.2 MG/ML IJ SOLN
INTRAMUSCULAR | Status: DC | PRN
  Administered 2011-06-22: 0.2 mg via INTRAVENOUS
  Administered 2011-06-22: .6 mg via INTRAVENOUS

## 2011-06-22 MED ORDER — ARTIFICIAL TEARS OP OINT
TOPICAL_OINTMENT | OPHTHALMIC | Status: DC | PRN
  Administered 2011-06-22: 1 via OPHTHALMIC

## 2011-06-22 MED ORDER — LOSARTAN POTASSIUM 50 MG PO TABS: 100.0000 mg | ORAL_TABLET | Freq: Every day | ORAL | Status: DC

## 2011-06-22 MED ORDER — FENTANYL CITRATE 0.05 MG/ML IJ SOLN
INTRAMUSCULAR | Status: DC | PRN
  Administered 2011-06-22: 175 ug via INTRAVENOUS
  Administered 2011-06-22: 75 ug via INTRAVENOUS

## 2011-06-22 MED ORDER — HYDROMORPHONE HCL PF 1 MG/ML IJ SOLN
0.2500 mg | INTRAMUSCULAR | Status: DC | PRN
  Administered 2011-06-22 (×2): 0.5 mg via INTRAVENOUS

## 2011-06-22 MED ORDER — ROCURONIUM BROMIDE 100 MG/10ML IV SOLN
INTRAVENOUS | Status: DC | PRN
  Administered 2011-06-22: 50 mg via INTRAVENOUS

## 2011-06-22 MED ORDER — GUAIFENESIN-DM 100-10 MG/5ML PO SYRP
15.0000 mL | ORAL_SOLUTION | ORAL | Status: DC | PRN
  Filled 2011-06-22: qty 5

## 2011-06-22 MED ORDER — PANTOPRAZOLE SODIUM 40 MG PO TBEC
40.0000 mg | DELAYED_RELEASE_TABLET | Freq: Every day | ORAL | Status: DC
  Administered 2011-06-22: 40 mg via ORAL
  Filled 2011-06-22: qty 1

## 2011-06-22 MED ORDER — ACETAMINOPHEN 325 MG PO TABS: 325.0000 mg | ORAL_TABLET | ORAL | Status: DC | PRN

## 2011-06-22 MED ORDER — DEXTROSE 5 % IV SOLN
1.5000 g | Freq: Two times a day (BID) | INTRAVENOUS | Status: AC
  Administered 2011-06-22 – 2011-06-23 (×2): 1.5 g via INTRAVENOUS
  Filled 2011-06-22 (×2): qty 1.5

## 2011-06-22 MED ORDER — LACTATED RINGERS IV SOLN
INTRAVENOUS | Status: DC | PRN
  Administered 2011-06-22: 07:00:00 via INTRAVENOUS

## 2011-06-22 MED ORDER — 0.9 % SODIUM CHLORIDE (POUR BTL) OPTIME
TOPICAL | Status: DC | PRN
  Administered 2011-06-22: 1000 mL

## 2011-06-22 MED ORDER — HYDRALAZINE HCL 20 MG/ML IJ SOLN
INTRAMUSCULAR | Status: DC | PRN
  Administered 2011-06-22: 10 mg via INTRAVENOUS

## 2011-06-22 MED ORDER — HETASTARCH-ELECTROLYTES 6 % IV SOLN
INTRAVENOUS | Status: DC | PRN
  Administered 2011-06-22: 09:00:00 via INTRAVENOUS

## 2011-06-22 MED ORDER — SODIUM CHLORIDE 0.9 % IV SOLN: 500.0000 mL | Freq: Once | INTRAVENOUS | Status: AC | PRN

## 2011-06-22 MED ORDER — LACTATED RINGERS IV SOLN
INTRAVENOUS | Status: DC | PRN
  Administered 2011-06-22 (×3): via INTRAVENOUS

## 2011-06-22 MED ORDER — NEOSTIGMINE METHYLSULFATE 1 MG/ML IJ SOLN
INTRAMUSCULAR | Status: DC | PRN
  Administered 2011-06-22: 4 mg via INTRAVENOUS

## 2011-06-22 MED ORDER — ALBUTEROL SULFATE (5 MG/ML) 0.5% IN NEBU
2.5000 mg | INHALATION_SOLUTION | Freq: Once | RESPIRATORY_TRACT | Status: AC
  Administered 2011-06-22: 2.5 mg via RESPIRATORY_TRACT

## 2011-06-22 MED ORDER — ACETAMINOPHEN 650 MG RE SUPP: 325.0000 mg | RECTAL | Status: DC | PRN

## 2011-06-22 MED ORDER — WARFARIN SODIUM 6 MG PO TABS
6.0000 mg | ORAL_TABLET | ORAL | Status: DC
  Filled 2011-06-22: qty 1

## 2011-06-22 MED ORDER — HEPARIN SODIUM (PORCINE) 1000 UNIT/ML IJ SOLN
INTRAMUSCULAR | Status: DC | PRN
  Administered 2011-06-22: 8000 [IU] via INTRAVENOUS

## 2011-06-22 MED ORDER — SODIUM CHLORIDE 0.9 % IR SOLN
Status: DC | PRN
  Administered 2011-06-22: 09:00:00

## 2011-06-22 MED ORDER — ENOXAPARIN SODIUM 40 MG/0.4ML ~~LOC~~ SOLN
40.0000 mg | SUBCUTANEOUS | Status: DC
  Administered 2011-06-23: 40 mg via SUBCUTANEOUS
  Filled 2011-06-22 (×2): qty 0.4

## 2011-06-22 MED ORDER — ATORVASTATIN CALCIUM 10 MG PO TABS
10.0000 mg | ORAL_TABLET | Freq: Every day | ORAL | Status: DC
  Administered 2011-06-22: 10 mg via ORAL
  Filled 2011-06-22 (×2): qty 1

## 2011-06-22 MED ORDER — LOSARTAN POTASSIUM 50 MG PO TABS
100.0000 mg | ORAL_TABLET | Freq: Every day | ORAL | Status: DC
  Administered 2011-06-23: 100 mg via ORAL
  Filled 2011-06-22: qty 2

## 2011-06-22 MED ORDER — AMIODARONE HCL 200 MG PO TABS
200.0000 mg | ORAL_TABLET | Freq: Every day | ORAL | Status: DC
  Administered 2011-06-23: 200 mg via ORAL
  Filled 2011-06-22: qty 1

## 2011-06-22 MED ORDER — LABETALOL HCL 5 MG/ML IV SOLN: 10.0000 mg | INTRAVENOUS | Status: DC | PRN

## 2011-06-22 MED ORDER — ALUM & MAG HYDROXIDE-SIMETH 200-200-20 MG/5ML PO SUSP: 15.0000 mL | ORAL | Status: DC | PRN

## 2011-06-22 MED ORDER — ALBUTEROL SULFATE HFA 108 (90 BASE) MCG/ACT IN AERS
INHALATION_SPRAY | RESPIRATORY_TRACT | Status: DC | PRN
  Administered 2011-06-22 (×2): 2 via RESPIRATORY_TRACT

## 2011-06-22 MED ORDER — WARFARIN - PHYSICIAN DOSING INPATIENT: Freq: Every day | Status: DC

## 2011-06-22 MED ORDER — DOCUSATE SODIUM 100 MG PO CAPS
100.0000 mg | ORAL_CAPSULE | Freq: Every day | ORAL | Status: DC
  Administered 2011-06-23: 100 mg via ORAL
  Filled 2011-06-22: qty 1

## 2011-06-22 MED ORDER — METOPROLOL TARTRATE 1 MG/ML IV SOLN: 2.0000 mg | INTRAVENOUS | Status: DC | PRN

## 2011-06-22 MED ORDER — LIDOCAINE HCL (CARDIAC) 20 MG/ML IV SOLN: INTRAVENOUS | Status: DC | PRN

## 2011-06-22 MED ORDER — PROTAMINE SULFATE 10 MG/ML IV SOLN
INTRAVENOUS | Status: DC | PRN
  Administered 2011-06-22: 30 mg via INTRAVENOUS

## 2011-06-22 MED ORDER — SODIUM CHLORIDE 0.9 % IR SOLN
Status: DC | PRN
  Administered 2011-06-22: 08:00:00

## 2011-06-22 MED ORDER — METOPROLOL TARTRATE 12.5 MG HALF TABLET
12.5000 mg | ORAL_TABLET | Freq: Two times a day (BID) | ORAL | Status: DC
  Administered 2011-06-22 – 2011-06-23 (×2): 12.5 mg via ORAL
  Filled 2011-06-22 (×3): qty 1

## 2011-06-22 MED ORDER — MIDAZOLAM HCL 5 MG/5ML IJ SOLN
INTRAMUSCULAR | Status: DC | PRN
  Administered 2011-06-22: 2 mg via INTRAVENOUS

## 2011-06-22 SURGICAL SUPPLY — 74 items
BAG DECANTER FOR FLEXI CONT (MISCELLANEOUS) IMPLANT
BAG SNAP BAND KOVER 36X36 (MISCELLANEOUS) ×2 IMPLANT
BALLN CODA OCL 2-9.0-35-120-3 (BALLOONS)
BALLOON COD OCL 2-9.0-35-120-3 (BALLOONS) IMPLANT
CANISTER SUCTION 2500CC (MISCELLANEOUS) ×2 IMPLANT
CATH BEACON 5.038 65CM KMP-01 (CATHETERS) ×2 IMPLANT
CATH OMNI FLUSH .035X70CM (CATHETERS) ×2 IMPLANT
CLIP TI MEDIUM 24 (CLIP) IMPLANT
CLIP TI WIDE RED SMALL 24 (CLIP) IMPLANT
CLOTH BEACON ORANGE TIMEOUT ST (SAFETY) ×2 IMPLANT
COVER MAYO STAND STRL (DRAPES) ×2 IMPLANT
COVER PROBE W GEL 5X96 (DRAPES) ×2 IMPLANT
COVER SURGICAL LIGHT HANDLE (MISCELLANEOUS) ×4 IMPLANT
DERMABOND ADVANCED (GAUZE/BANDAGES/DRESSINGS) ×2
DERMABOND ADVANCED .7 DNX12 (GAUZE/BANDAGES/DRESSINGS) ×2 IMPLANT
DEVICE CLOSURE PERCLS PRGLD 6F (VASCULAR PRODUCTS) ×4 IMPLANT
DRAIN CHANNEL 10F 3/8 F FF (DRAIN) IMPLANT
DRAIN CHANNEL 10M FLAT 3/4 FLT (DRAIN) IMPLANT
DRAPE C-ARM 42X72 X-RAY (DRAPES) ×2 IMPLANT
DRAPE TABLE COVER HEAVY DUTY (DRAPES) ×2 IMPLANT
DRESSING OPSITE X SMALL 2X3 (GAUZE/BANDAGES/DRESSINGS) ×4 IMPLANT
DRYSEAL FLEXSHEATH 18FR 33CM (SHEATH) ×1
ELECT CAUTERY BLADE 6.4 (BLADE) ×2 IMPLANT
ELECT REM PT RETURN 9FT ADLT (ELECTROSURGICAL) ×4
ELECTRODE REM PT RTRN 9FT ADLT (ELECTROSURGICAL) ×2 IMPLANT
EVACUATOR 3/16  PVC DRAIN (DRAIN)
EVACUATOR 3/16 PVC DRAIN (DRAIN) IMPLANT
EVACUATOR SILICONE 100CC (DRAIN) IMPLANT
EXCLUDER TNK LEG 35MX14X16 (Endovascular Graft) ×1 IMPLANT
EXCLUDER TRUNK LEG 35MX14X16 (Endovascular Graft) ×2 IMPLANT
GLOVE BIO SURGEON STRL SZ7 (GLOVE) ×2 IMPLANT
GLOVE BIO SURGEON STRL SZ7.5 (GLOVE) ×2 IMPLANT
GLOVE BIOGEL PI IND STRL 7.5 (GLOVE) ×2 IMPLANT
GLOVE BIOGEL PI INDICATOR 7.5 (GLOVE) ×2
GLOVE SURG SS PI 7.0 STRL IVOR (GLOVE) ×2 IMPLANT
GOWN STRL NON-REIN LRG LVL3 (GOWN DISPOSABLE) ×4 IMPLANT
GOWN STRL REIN XL XLG (GOWN DISPOSABLE) ×2 IMPLANT
GRAFT BALLN CATH 65CM (STENTS) ×1 IMPLANT
GUIDEWIRE ANGLED .035X150CM (WIRE) ×2 IMPLANT
KIT BASIN OR (CUSTOM PROCEDURE TRAY) ×2 IMPLANT
KIT LAP CBDE (SET/KITS/TRAYS/PACK) ×2 IMPLANT
KIT ROOM TURNOVER OR (KITS) ×2 IMPLANT
LEG CONTRALATERAL 20X11.5 (Vascular Products) ×1 IMPLANT
NEEDLE PERC 18GX7CM (NEEDLE) ×2 IMPLANT
NS IRRIG 1000ML POUR BTL (IV SOLUTION) ×2 IMPLANT
PACK AORTA (CUSTOM PROCEDURE TRAY) ×2 IMPLANT
PACK PERIPHERAL VASCULAR (CUSTOM PROCEDURE TRAY) ×2 IMPLANT
PAD ARMBOARD 7.5X6 YLW CONV (MISCELLANEOUS) ×4 IMPLANT
PENCIL BUTTON HOLSTER BLD 10FT (ELECTRODE) IMPLANT
PERCLOSE PROGLIDE 6F (VASCULAR PRODUCTS) ×8
SHEATH AVANTI 11CM 8FR (MISCELLANEOUS) ×4 IMPLANT
SHEATH BRITE TIP 8FR 23CM (MISCELLANEOUS) ×2 IMPLANT
SHEATH DRYSEAL FLEX 18FR 33CM (SHEATH) ×1 IMPLANT
SHEATH DRYSEAL GORE 12FRX28 (SHEATH) ×2 IMPLANT
STAPLER VISISTAT 35W (STAPLE) IMPLANT
STENT GRAFT BALLN CATH 65CM (STENTS) ×1
STENT GRAFT CONTRALAT 20X11.5 (Vascular Products) ×1 IMPLANT
STOPCOCK MORSE 400PSI 3WAY (MISCELLANEOUS) ×2 IMPLANT
SUT PROLENE 5 0 C 1 24 (SUTURE) IMPLANT
SUT VIC AB 2-0 CTB1 (SUTURE) IMPLANT
SUT VIC AB 3-0 SH 27 (SUTURE)
SUT VIC AB 3-0 SH 27X BRD (SUTURE) IMPLANT
SUT VICRYL 4-0 PS2 18IN ABS (SUTURE) ×4 IMPLANT
SYR 20CC LL (SYRINGE) ×4 IMPLANT
SYR 30ML LL (SYRINGE) IMPLANT
SYR MEDRAD MARK V 150ML (SYRINGE) ×2 IMPLANT
SYRINGE 10CC LL (SYRINGE) ×6 IMPLANT
TOWEL OR 17X24 6PK STRL BLUE (TOWEL DISPOSABLE) ×4 IMPLANT
TOWEL OR 17X26 10 PK STRL BLUE (TOWEL DISPOSABLE) ×2 IMPLANT
TRAY FOLEY CATH 14FRSI W/METER (CATHETERS) ×2 IMPLANT
TUBING HIGH PRESSURE 120CM (CONNECTOR) IMPLANT
WATER STERILE IRR 1000ML POUR (IV SOLUTION) IMPLANT
WIRE AMPLATZ SS-J .035X180CM (WIRE) ×4 IMPLANT
WIRE BENTSON .035X145CM (WIRE) ×6 IMPLANT

## 2011-06-22 NOTE — Progress Notes (Signed)
VASCULAR PROGRESS NOTE  SUBJECTIVE: Comfortable  PHYSICAL EXAM: Filed Vitals:   06/22/11 1050 06/22/11 1100 06/22/11 1143 06/22/11 1200  BP: 133/76  143/74 141/73  Pulse: 66 63  67  Temp:   97.5 F (36.4 C)   TempSrc:   Oral   Resp: 18 19  13   Height:   5\' 10"  (1.778 m)   Weight:   197 lb 1.5 oz (89.4 kg)   SpO2: 96% 93%  96%   Groin sites look fine Palpable PT pulses bilat.   LABS:  Lab Results  Component Value Date   INR 1.26 06/22/2011   ASSESSMENT/PLAN: Doing well S/P EVAR Anticipate D/C tomorrow He can resume his Coumadin at home tomorrow PM. His PT (INR) is followed by his PMD.  Waverly Ferrari, MD, FACS Beeper: [phone] 06/22/2011

## 2011-06-22 NOTE — Anesthesia Postprocedure Evaluation (Signed)
  Anesthesia Post-op Note  Patient: Gregory Turner  Procedure(s) Performed: Procedure(s) (LRB): ENDOVASCULAR STENT GRAFT INSERTION (N/A)  Patient Location: PACU  Anesthesia Type: General  Level of Consciousness: awake, alert  and oriented  Airway and Oxygen Therapy: Patient Spontanous Breathing and Patient connected to nasal cannula oxygen  Post-op Pain: none  Post-op Assessment: Post-op Vital signs reviewed and Patient's Cardiovascular Status Stable  Post-op Vital Signs: stable  Complications: No apparent anesthesia complications

## 2011-06-22 NOTE — Anesthesia Procedure Notes (Signed)
Procedure Name: Intubation Date/Time: 06/22/2011 7:55 AM Performed by: Luster Landsberg Pre-anesthesia Checklist: Patient identified, Emergency Drugs available, Suction available and Patient being monitored Patient Re-evaluated:Patient Re-evaluated prior to inductionOxygen Delivery Method: Circle system utilized Preoxygenation: Pre-oxygenation with 100% oxygen Intubation Type: IV induction Ventilation: Mask ventilation without difficulty and Oral airway inserted - appropriate to patient size Laryngoscope Size: Mac and 3 Grade View: Grade I Tube type: Oral Tube size: 8.0 mm Number of attempts: 1 Airway Equipment and Method: Stylet Placement Confirmation: ETT inserted through vocal cords under direct vision,  positive ETCO2 and breath sounds checked- equal and bilateral Secured at: 22 cm Tube secured with: Tape Dental Injury: Teeth and Oropharynx as per pre-operative assessment

## 2011-06-22 NOTE — Op Note (Signed)
NAME: Gregory Turner   MRN: [id number] DOB: 1941-01-13    DATE OF OPERATION: 06/22/2011  PREOP DIAGNOSIS: 5.9 cm infrarenal abdominal aortic aneurysm  POSTOP DIAGNOSIS: same  PROCEDURE:  1. Ultrasound-guided access to bilateral common femoral arteries 2. aortogram  3. Endovascular aneurysm repair using a modular bifurcated prosthesis with one limb. (Gore Excluder) 4. Perclose bilateral common femoral arteries.  SURGEON: Di Kindle. Edilia Bo, MD, FACS  COSURGEON: Leonides Sake M.D.  ANESTHESIA: Gen.   EBL: 100 cc  INDICATIONS: Gregory Turner is a 71 y.o. male who was recently diagnosed with atrial fibrillation. He underwent ultrasound which showed a 5.9 cm infrarenal abdominal aortic aneurysm. He was felt to be a good candidate for endovascular aneurysm repair and is brought in for elective repair.  FINDINGS: completion arteriogram shows good position of graft with no type I endoleak noted.  TECHNIQUE: Patient was brought to the operating room after monitoring lines were placed by anesthesia. The abdomen both groins were prepped and draped in the usual sterile fashion. On the right side under ultrasound guidance the right common femoral artery was Preclosed using 2 Perclose devices. This is dictated by Dr. Imogene Burn. An 8 French sheath was introduced on the right side. On the left side, again under ultrasound guidance, the left common femoral artery was pre-closed using 2 Perclose devices. The first device was rotated 30 medially and the second device was rotated 30 laterally. An 8 French sheath was introduced on the left. The patient was heparinized at this point. Pigtail catheter was positioned at the level of the renal arteries. Arteriogram was obtained which allowed [place] to measure the distance from the left renal artery to the left hypogastric artery and thus select the appropriate length for our trunk-ipsilateral leg endoprosthesis. Next the pigtail catheter was used to exchange to an Amplatz wire  which was positioned in the thoracic aorta and then marked. The 92 French sheath was then introduced over the Amplatz wire after the 8 French sheath was removed. The pigtail catheter was then positioned from the right side at the level of the renal arteries. The trunk-ipsilateral leg endoprosthesis was a 35 mm by working millimeters by 16 cm length device. This was advanced through the left femoral sheath and positioned at the level of the renal arteries. Arteriogram was obtained to check the position of the renal arteries. The sheath on the left was retracted below the device. The proximal aspect of the device was deployed and was slightly high. This reason the proximal segment was constrained using the Gore mechanism to allow [place] to pull the device slightly inferior. The proximal segment was then re\re deployed. Completion arteriogram showed excellent position.  We had elected to cross limbs of the device and from the right side Dr. Imogene Burn cannulated the contralateral gate we confirmed that the wire was intraluminal by placing a pigtail catheter and turning it within the endoprosthesis. The Amplatz wire was then introduced and then the contralateral limb which was a 20 mm x 11.5 cm device is positioned into the contralateral gate overlapping 3 cm. This was then deployed. We had made measurements to confirm the position of the hypogastric artery and confirm the length of the device. Next the ipsilateral limb of the main component was deployed.  Next the proximal graft in areas of overlap and distal graft were ballooned from both sides. Pigtail catheter was in repeat position from the right side a completion arteriogram showed excellent result with good positioning of the graft and no evidence of  type I endoleak. The common femoral arteries were then closed using the 2 Perclose sutures which had been placed on each side. There was good hemostasis at the completion on both sides. The skin was closed with 4-0 Vicryl  and Dermabond was applied.  At the completion of the procedure there were palpable posterior tibial pulses bilaterally.  Waverly Ferrari, MD, FACS [facility]  DATE OF DICTATION:   06/22/2011

## 2011-06-22 NOTE — H&P (View-Only) (Signed)
[facility]  Patient name: Gregory Turner MRN: [id number] DOB: August 06, 1940 Sex: male  REASON FOR ADMISSION: for elective repair of 5.9 cm infrarenal abdominal aortic aneurysm.  HPI: Gregory Turner is a 71 y.o. male who was recently diagnosed with atrial fibrillation. His workup included an ultrasound of the abdomen which showed a 5.9 cm infrarenal abdominal aortic aneurysm. He was sent for vascular consultation area Dr. Mariah Milling planned on holding off on and at lesion procedure for his atrial fibrillation and so the aneurysm was addressed. CT scan done in [facility] showed a 5.5 cm infrarenal abdominal aortic aneurysm. Patient has not had any abdominal pain or back pain. He has undergone workup including an aortogram which shows he appears to be a reasonable candidate for endovascular repair of his aneurysm.  Past Medical History  Diagnosis Date  . HTN (hypertension)   . COPD (chronic obstructive pulmonary disease)   . Bronchitis   . Afib     Family History  Problem Relation Age of Onset  . Hypertension Mother   . Cancer Brother     lung  . Deep vein thrombosis Brother   . Other Brother     AAA  . Heart disease Father   . Heart attack Father     SOCIAL HISTORY: History  Substance Use Topics  . Smoking status: Current Everyday Smoker -- 1.0 packs/day for 53 years    Types: Cigarettes  . Smokeless tobacco: Never Used  . Alcohol Use: No    Allergies  Allergen Reactions  . Sulfa Drugs Cross Reactors Other (See Comments)    Makes patient feel faint    Current Outpatient Prescriptions  Medication Sig Dispense Refill  . acetaminophen (TYLENOL) 500 MG tablet Take 500 mg by mouth every 6 (six) hours as needed. For pain      . albuterol (PROVENTIL HFA;VENTOLIN HFA) 108 (90 BASE) MCG/ACT inhaler Inhale 2 puffs into the lungs every 6 (six) hours as needed. For shortness of breath      . amiodarone (PACERONE) 200 MG tablet Take 100 mg by  mouth 2 (two) times daily.      Marland Kitchen atorvastatin (LIPITOR) 10 MG tablet Take 10 mg by mouth daily.      Marland Kitchen losartan (COZAAR) 100 MG tablet Take 100 mg by mouth daily.      . metoprolol tartrate (LOPRESSOR) 25 MG tablet Take 12.5 mg by mouth 2 (two) times daily.       Marland Kitchen warfarin (COUMADIN) 6 MG tablet Take 6-9 mg by mouth daily. Take 6mg  on day 1, 9mg  on day 2, 6mg  on day 3 and continue accordingly        REVIEW OF SYSTEMS: Arly.Keller ] denotes positive finding; [  ] denotes negative finding CARDIOVASCULAR:  [ ]  chest pain   [ ]  chest pressure   [ ]  palpitations   [ ]  orthopnea   [ ]  dyspnea on exertion   [ ]  claudication   [ ]  rest pain   [ ]  DVT   [ ]  phlebitis PULMONARY:   [ ]  productive cough   [ ]  asthma   [ ]  wheezing NEUROLOGIC:   [ ]  weakness  [ ]  paresthesias  [ ]  aphasia  [ ]  amaurosis  [ ]  dizziness HEMATOLOGIC:   [ ]  bleeding problems   [ ]  clotting disorders MUSCULOSKELETAL:  [ ]  joint pain   [ ]  joint swelling [ ]  leg swelling GASTROINTESTINAL: [ ]   blood in stool  [ ]   hematemesis GENITOURINARY:  [ ]   dysuria  [ ]   hematuria PSYCHIATRIC:  [ ]  history of major depression INTEGUMENTARY:  [ ]  rashes  [ ]  ulcers CONSTITUTIONAL:  [ ]  fever   [ ]  chills  PHYSICAL EXAM: Filed Vitals:   06/02/11 1034  BP: 165/101  Pulse: 53  Resp: 18  Height: 5\' 10"  (1.778 m)  Weight: 190 lb (86.183 kg)   Body mass index is 27.26 kg/(m^2). GENERAL: The patient is a well-nourished male, in no acute distress. The vital signs are documented above. CARDIOVASCULAR: There is a regular rate and rhythm without significant murmur appreciated. I do not detect carotid bruits. He has palpable femoral, popliteal, and posterior tibial pulses bilaterally. He has no significant lower extremity swelling. PULMONARY: There is good air exchange bilaterally without wheezing or rales. ABDOMEN: Soft and non-tender with normal pitched bowel sounds. His aneurysm is palpable and nontender. MUSCULOSKELETAL: There are no major  deformities or cyanosis. NEUROLOGIC: No focal weakness or paresthesias are detected. SKIN: There are no ulcers or rashes noted. PSYCHIATRIC: The patient has a normal affect.  DATA:  I have reviewed his CT scan which shows a 5.4 cm infrarenal abdominal aortic aneurysm. The neck of the aneurysm is approximately 30 mm in diameter a centimeter and a half distal to the lowest renal on the left. I've also reviewed his arteriogram which shows it appears to be a reasonable candidate for EVAR.  MEDICAL ISSUES: He presents for elective EVAR of his 5.5 cm infrarenal abdominal aortic aneurysm. I have discussed the indications for aneurysm repair. I have explained that without repair, the risk of the aneurysm rupturing is 5-10% per year. We have discussed the advantages and disadvantages of open versus endovascular repair. The patient wishes to proceed with endovascular aneurysm repair (EVAR). I have discussed the potential complications of EVAR, including, but not limited to: bleeding, infection, arterial injury, graft migration, endoleak, renal failure, MI or other unpredictable medical problems. We have discussed the possibility of having to convert to open repair. We also discussed the need for continued lifelong follow-up after EVAR. All of the patients questions were answered and they are agreeable to proceed with surgery. We will schedule his surgery within the next few weeks. His Coumadin will be held preoperatively.  Zanyla Klebba S [facility] [place] Office: [phone]

## 2011-06-22 NOTE — Transfer of Care (Signed)
Immediate Anesthesia Transfer of Care Note  Patient: Gregory Turner  Procedure(s) Performed: Procedure(s) (LRB): ENDOVASCULAR STENT GRAFT INSERTION (N/A)  Patient Location: PACU  Anesthesia Type: General  Level of Consciousness: awake, alert  and oriented  Airway & Oxygen Therapy: Patient Spontanous Breathing and Patient connected to face mask oxygen  Post-op Assessment: Report given to PACU RN, Post -op Vital signs reviewed and stable, Patient moving all extremities and Patient able to stick tongue midline  Post vital signs: Reviewed and stable  Complications: No apparent anesthesia complications

## 2011-06-22 NOTE — Interval H&P Note (Signed)
History and Physical Interval Note:  06/22/2011 7:27 AM  Gregory Turner  has presented today for surgery, with the diagnosis of Abdominal Aortic Aneurysm  The various methods of treatment have been discussed with the patient and family. After consideration of risks, benefits and other options for treatment, the patient has consented to: ENDOVASCULAR STENT GRAFT INSERTION (N/A) as a surgical intervention .  The patients' history has been reviewed, patient examined, no change in status, stable for surgery.  I have reviewed the patients' chart and labs.  Questions were answered to the patient's satisfaction.     Graison Leinberger S

## 2011-06-22 NOTE — Op Note (Signed)
OPERATIVE NOTE  PROCEDURE:  1. Bilateral common femoral artery cannulation under ultrasound guidance 2. "Preclose" repair bilateral common femoral artery  3. Placement of catheter in aorta x 2 4. Aortogram 5. Repair of aorta with modular bifurcated prosthesis with one limb 6. Placement of right iliac limb 7. Radiology S&I                                                               PRE-OPERATIVE DIAGNOSIS: asymptomatic abdominal aortic aneurysm  CO-SURGEONs: Cari Caraway, MD; Leonides Sake, MD  ANESTHESIA: general   ESTIMATED BLOOD LOSS: 50 cc   FINDING(S):  1. No type I endoleak 2. Bilateral renal arteries widely patent at end of case 3. Successful exclusion of abdominal aortic aneurysm   INDICATIONS:  Gregory Turner is 71 y.o. male who presents with large asymptomatic abdominal aortic aneurysm. The patient is aware the risks of aortic surgery include but are not limited to: bleeding, need for transfusion, infection, death, stroke, paralysis, wound complications, impotence, bowel ischemia, extended ventilation and need for secondary procedures.   DESCRIPTION: After full informed written consent was obtained from the patient, he was brought back to the operating room, amd placed supine upon the operating table. He already had a A-line place. After obtaining adequate anesthesia, a Foley catheter was placed to monitor urine output in the operating room. He was prepped and draped in the standard fashion for either open aneurysm repair or endovascular aneurysm repair. I turned my attention to the right groin. Under ultrasound guidance, I identified the common femoral artery and made a stab incision over it. I dissected down to the artery in the groin with a hemostat and then cannulated it with a 18-gauge needle. I then passed a Bentson wire up into the iliac system. I dilated the tract with a 8-Fr dilator.  The first ProGlide was placed and rotated medially. The second ProGlide was placed and  rotated laterally. In such fashion, I completed the pre-close technique on this side and then replaced the Bentson wire up into the aorta. A long 8-French sheath was loaded over the wire.  At this point then the left groin was cannulated by Dr. Edilia Bo in a similar fashion to my side and the pre-close technique was utilized on this side. At this point, the sheath on the left side was exchanged for a 18-French Dryseal sheath. The patient was given a therapeutic bolus of Heparin for his weight.  The pigtail catheter was placed over the wire and reconstituted in the suprarenal position.  A power injector aortogram was completed after a de-airring and de-clotting maneuver.  Based on this injector, the level of the renal arteries (left being lowest) was marked.  A Gore C3 35 mm x 14 mm x 16 cm main body was selected.  I switched the pigtail catheter to the right wire and reconstituted it in a suprarenal position.  Dr. Edilia Bo then delivered the main body device to an infrarenal position.  The main body was partial deployed.  A completion aortogram demonstrated the the main body was a bit too high, so it was reconstrained and redeployed below the level of the left renal artery.  A completion aortogram demonstrated this.  At this point, I pulled down the pigtail catheter and wire and then switched  out for a KMP and glidewire. Using this combination, the pigtail catheter and Bentson, I was able to cannulate the contralateral gate. I then advanced both of these through the graft. The wire was exchanged for an Amplatz wire. I loaded a pigtail catheter over the wire.  The pigtail was then pulled down into the graft and rotated to demonstrate successful cannulation of the graft. I then pulled the catheter down to the flow bifurcation and then pulled the sheath back on right side and did a retrograde injection from the sheath demonstrating the level of the internal iliac artery. Then subsequently based on the measurements, a  20-mm x 11.5 cm contralateral limb was selected. At this point, the left 8-French sheath was exchanged out for a 12-French Dryseal sheath which was then advanced up to level of flow divider. I then placed the contralateral limb was then placed through this sheath and then the sheath was pulled back to appropriate location. The iliac device was then deployed with adequate overlap. I then removed the sheath deployment system. At this point, Dr. Edilia Bo I fully deployed the main body, fully extending the partially constrained limb and also releasing the device from the delivery system. The delivery system was then removed.  We then obtained a molding balloon which was used to mold the top, the flow divider, the overlap segments and the ends of each iliac limb. The pigtail catheter was replaced on the right side and then a completion aortogram was completed. This demonstrated no type I endoleak and successful exclusion of the iliac aneurysms. Subsequently, we turned our attention to repairing each groin. I removed the right sheath and held pressure. There was absolutely no drop in pressure with removal of the sheath. The right groin was repaired by cinching down the ProGlide devices in two stages. In the first stage, the wire was left in place while cinching down the two sets of Proglide stitches. There was minimal blood loss with the wire in place so I removed he wire. The Proglide stitches were cinched down further and the white tightening stitch was pulled to fully deploy secure the knot. All four sutures were held in place with a hemostat with tension on the skin. In a similar fashion, the left groin was repaired. Dr. Edilia Bo gave the patient 30 mg of Protamine to reverse anticoagulation. There was no further significant bleeding from either groin cannulation site so the sutures were transected with the suture transection device. Each groin was repaired with a U stitch of 4-0 Monocryl.   SPECIMEN(S): none    COMPLICATIONS: none   CONDITION: stable

## 2011-06-22 NOTE — Preoperative (Signed)
Beta Blockers   Reason not to administer Beta Blockers:Not Applicable 

## 2011-06-22 NOTE — Anesthesia Preprocedure Evaluation (Signed)
Anesthesia Evaluation  Patient identified by MRN, date of birth, ID band Patient awake    Reviewed: Allergy & Precautions, H&P , NPO status , Patient's Chart, lab work & pertinent test results, reviewed documented beta blocker date and time   Airway Mallampati: II      Dental  (+) Teeth Intact   Pulmonary  breath sounds clear to auscultation        Cardiovascular Rhythm:Irregular Rate:Normal     Neuro/Psych    GI/Hepatic   Endo/Other    Renal/GU      Musculoskeletal   Abdominal   Peds  Hematology   Anesthesia Other Findings   Reproductive/Obstetrics                           Anesthesia Physical Anesthesia Plan  ASA: III  Anesthesia Plan: General   Post-op Pain Management:    Induction: Intravenous  Airway Management Planned: Oral ETT  Additional Equipment: Arterial line and CVP  Intra-op Plan:   Post-operative Plan: Extubation in OR  Informed Consent:   Dental advisory given  Plan Discussed with: CRNA and Surgeon  Anesthesia Plan Comments: (AAA S/P AVR Chronic AFib Normal LV systolic function Htn Smoker/COPD  Plan GA   Kipp Brood, MD)        Anesthesia Quick Evaluation

## 2011-06-23 ENCOUNTER — Encounter (HOSPITAL_COMMUNITY): Payer: Self-pay | Admitting: Vascular Surgery

## 2011-06-23 ENCOUNTER — Other Ambulatory Visit: Payer: Self-pay | Admitting: Physician Assistant

## 2011-06-23 ENCOUNTER — Other Ambulatory Visit: Payer: Self-pay | Admitting: *Deleted

## 2011-06-23 ENCOUNTER — Telehealth: Payer: Self-pay | Admitting: Vascular Surgery

## 2011-06-23 DIAGNOSIS — I714 Abdominal aortic aneurysm, without rupture, unspecified: Secondary | ICD-10-CM

## 2011-06-23 DIAGNOSIS — Z8679 Personal history of other diseases of the circulatory system: Secondary | ICD-10-CM

## 2011-06-23 LAB — PROTIME-INR
INR: 1.26 (ref 0.00–1.49)
Prothrombin Time: 16.1 seconds — ABNORMAL HIGH (ref 11.6–15.2)

## 2011-06-23 LAB — BASIC METABOLIC PANEL
Calcium: 8.2 mg/dL — ABNORMAL LOW (ref 8.4–10.5)
GFR calc non Af Amer: 85 mL/min — ABNORMAL LOW (ref >90)
Sodium: 136 mEq/L (ref 135–145)

## 2011-06-23 LAB — CBC
MCH: 31.9 pg (ref 26.0–34.0)
MCHC: 33.1 g/dL (ref 30.0–36.0)
MCV: 96.5 fL (ref 78.0–100.0)
Platelets: 90 10*3/uL — ABNORMAL LOW (ref 150–400)
RBC: 3.76 MIL/uL — ABNORMAL LOW (ref 4.22–5.81)

## 2011-06-23 MED ORDER — OXYCODONE-ACETAMINOPHEN 5-325 MG PO TABS: 1.0000 | ORAL_TABLET | Freq: Four times a day (QID) | ORAL | Status: AC | PRN

## 2011-06-23 NOTE — Progress Notes (Addendum)
[facility]   Post-op EVAR Date of Surgery: 06/22/2011 Surgeon: Surgeon(s): Chuck Hint, MD Fransisco Hertz, MD POD: 1 Day Post-Op Device:   History of Present Illness  Gregory Turner is a 71 y.o. male who is s/p EVAR. The patient denies back pain; denies abdominal pain; denies lower extremity pain.  He is Ambulating and taking PO well without nausea or vomiting. Pt. has voided with foley out    Significant Diagnostic Studies: CBC Lab Results  Component Value Date   WBC 7.2 06/23/2011   HGB 12.0* 06/23/2011   HCT 36.3* 06/23/2011   MCV 96.5 06/23/2011   PLT 90* 06/23/2011     BMET    Component Value Date/Time   NA 136 06/23/2011 0437   K 3.7 06/23/2011 0437   CL 105 06/23/2011 0437   CO2 25 06/23/2011 0437   GLUCOSE 118* 06/23/2011 0437   BUN 9 06/23/2011 0437   CREATININE 0.89 06/23/2011 0437   CALCIUM 8.2* 06/23/2011 0437   GFRNONAA 85* 06/23/2011 0437   GFRAA >90 06/23/2011 0437    COAG Lab Results  Component Value Date   INR 1.26 06/23/2011   INR 1.26 06/22/2011   INR 1.28 06/21/2011   No results found for this basename: PTT     I/O last 3 completed shifts: In: 5498.3 [P.O.:600; I.V.:4398.3; IV Piggyback:500] Out: 3930 [Urine:3830; Blood:100] Patient Vitals for the past 24 hrs:  Urine Occurrence  06/23/11 0653 1     Physical Examination  BP Readings from Last 3 Encounters:  06/23/11 127/67  06/23/11 127/67  06/17/11 163/84   Temp Readings from Last 3 Encounters:  06/23/11 98.8 F (37.1 C) Oral  06/23/11 98.8 F (37.1 C) Oral  06/17/11 97.4 F (36.3 C)    SpO2 Readings from Last 3 Encounters:  06/23/11 95%  06/23/11 95%  06/17/11 97%   Pulse Readings from Last 3 Encounters:  06/23/11 63  06/22/11 48  06/23/11 63    General: A&O x 3, WDWN male in NAD Gait: Normal Pulmonary: normal non-labored breathing  Cardiac: RRR Abdomen: soft, NT, NABS Bilateral groin wounds: clean, dry, intact, without hematoma Vascular  Exam/Pulses:Dp/PT doppler skin clean dry and warm  Extremities without ischemic changes, no Gangrene , no cellulitis; no open wounds;   Neurologic: A&O X 3; Appropriate Affect  Assessment: Gregory Turner is a 71 y.o. male who is 1 Day Post-Op EVAR.  Pt is doing well with no complaints  Plan: Home  The importance of surveillance of the endograft was discussed with the patient  A CTA of abdomen and pelvis will be scheduled for one month to assess for endoleak.  The patient will follow up with [place] in one month with these studies.   SignedMosetta Pigeon [phone] 06/23/2011 7:23 AM.  Groin sites look fine Palp PT pulses Home today. F/U 1 month.  Di Kindle. Edilia Bo, MD, FACS Beeper [phone] 06/23/2011

## 2011-06-23 NOTE — Progress Notes (Signed)
Pt d/c'd home with wife and daugther via w/c with belongings, explained and discussed d/c instruction, prescription, f/u appt. Given to pt and family. No questions at this time, no complaints of pain. Beryle Quant

## 2011-06-23 NOTE — Discharge Summary (Signed)
[facility] Discharge Summary   Patient ID:  Gregory Turner MRN: [id number] DOB/AGE: 70-06-1940 70 y.o.  Admit date: 06/22/2011 Discharge date: 06/23/2011 Date of Surgery: 06/22/2011 Surgeon: Surgeon(s): Chuck Hint, MD Fransisco Hertz, MD  Admission Diagnosis: AAA  Discharge Diagnoses:  AAA  Secondary Diagnoses: Past Medical History  Diagnosis Date  . HTN (hypertension)   . COPD (chronic obstructive pulmonary disease)   . Bronchitis   . Afib   . H/O echocardiogram 04/2011  . 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 Shortness of breath     walks 1/2 mile every evening, /w some SOB  . Blood transfusion     many yrs. ago  . Arthritis     shoulders, back & hips   . Hernia, inguinal     will need surgery on this in the future     Procedure(s): ENDOVASCULAR STENT GRAFT INSERTION  Discharged Condition: good  HPI: Gregory Turner is a 71 y.o. male who was recently diagnosed with atrial fibrillation. His workup included an ultrasound of the abdomen which showed a 5.9 cm infrarenal abdominal aortic aneurysm. He was sent for vascular consultation area Dr. Mariah Milling planned on holding off on and at lesion procedure for his atrial fibrillation and so the aneurysm was addressed. CT scan done in [facility] showed a 5.5 cm infrarenal abdominal aortic aneurysm. Patient has not had any abdominal pain or back pain. He has undergone workup including an aortogram which shows he appears to be a reasonable candidate for endovascular repair of his aneurysm.     Hospital Course:  Gregory Turner is a 71 y.o. male is S/P Bilateral Procedure(s): ENDOVASCULAR STENT GRAFT INSERTION Extubated: POD # 0 Post-op wounds clean, dry, intact or healing well Pt. Ambulating, voiding and taking PO diet without difficulty. Pt pain controlled with PO pain meds. Labs as below Complications:none  Consults:     Significant Diagnostic  Studies: CBC Lab Results  Component Value Date   WBC 7.2 06/23/2011   HGB 12.0* 06/23/2011   HCT 36.3* 06/23/2011   MCV 96.5 06/23/2011   PLT 90* 06/23/2011    BMET    Component Value Date/Time   NA 136 06/23/2011 0437   K 3.7 06/23/2011 0437   CL 105 06/23/2011 0437   CO2 25 06/23/2011 0437   GLUCOSE 118* 06/23/2011 0437   BUN 9 06/23/2011 0437   CREATININE 0.89 06/23/2011 0437   CALCIUM 8.2* 06/23/2011 0437   GFRNONAA 85* 06/23/2011 0437   GFRAA >90 06/23/2011 0437   COAG Lab Results  Component Value Date   INR 1.26 06/23/2011   INR 1.26 06/22/2011   INR 1.28 06/21/2011     Disposition:  Discharge to :Home Discharge Orders    Future Appointments: Provider: Department: Dept Phone: Center:   07/19/2011 10:15 AM Antonieta Iba, MD [facility] [phone] [facility]      [place], [place]  Home Medication Instructions JYN:[id number]   Printed on:06/23/11 0731  Medication Information                    warfarin (COUMADIN) 6 MG tablet Take 6-9 mg by mouth daily. Take 6mg  on day 1, 9mg  on day 2, 6mg  on day 3 and continue accordingly Last dose 06/16/2011, prep for surgery           losartan (COZAAR) 100 MG tablet Take 100 mg by mouth at bedtime.  metoprolol tartrate (LOPRESSOR) 25 MG tablet Take 12.5 mg by mouth 2 (two) times daily.            acetaminophen (TYLENOL) 500 MG tablet Take 500 mg by mouth every 6 (six) hours as needed. For pain           atorvastatin (LIPITOR) 10 MG tablet Take 10 mg by mouth at bedtime.            albuterol (PROVENTIL HFA;VENTOLIN HFA) 108 (90 BASE) MCG/ACT inhaler Inhale 2 puffs into the lungs every 6 (six) hours as needed. For shortness of breath           amiodarone (PACERONE) 400 MG tablet Take 200 mg by mouth daily.           diflorasone (PSORCON) 0.05 % cream Apply 1 application topically daily as needed. For eczema            Verbal and written Discharge instructions given to the patient.  A CTA of abdomen and pelvis will be  scheduled for one month to assess for endoleak.   The patient will follow up with [place] in one month with these studies with Dr. Edilia Bo.   SignedMosetta Pigeon 06/23/2011, 7:31 AM    Agree with plans for D/C today.  Di Kindle. Edilia Bo, MD, FACS Beeper [phone] 06/23/2011

## 2011-06-23 NOTE — Discharge Instructions (Signed)
Abdominal Aortic Aneurysm, Endograft Repair The aorta is a large blood vessel that carries blood from the heart to the rest of the body. An abdominal aortic aneurysm (AAA) is a weakness in the wall of the aorta and can cause the aorta to balloon or bulge out. If the aneurysm becomes too big, it can burst and be fatal.  An endograft is a special aortic stent graft used to repair the AAA. It is a tube made up of fabric, which is supported by a metal mesh (stent). It prevents the aneurysm from rupturing. The endograft creates a new track for the blood to flow inside the aorta, diverting it from the aneurysm. The stent is permanent. SYMPTOMS   May be absent.   Pain in the abdomen, chest or back. This may be a "throbbing," "aching" or "tearing" feeling.   A lump or mass in the abdomen that pulses.   Cold foot or blue toes.   Severe pain and collapse if the aneurysm bursts.  RISK FACTORS  Family history of AAA.   Smoking.   High blood pressure.   High levels of fat (cholesterol) in the blood that cling to the wall of the aorta.   "Hardening of the arteries" (arteriosclerosis).   Heart disease.   Diabetes.  TREATMENT   When the aneurysm is small, it will be watched closely.   The size is followed once or twice a year with an imaging study, such as an ultrasound.   Making lifestyle changes, such as quitting smoking and changing what you eat, are very important.   Your caregiver may have you take medicine to help reduce blood pressure and high cholesterol.   An aneurysm can be repaired in 1 of 2 ways:   Placement of an endograft through small cuts in your upper thigh (groin).   Major surgery to repair the aneurysm.  LET YOUR CAREGIVER KNOW ABOUT:  Allergies.   Medicines taken including herbs, eye drops, prescription medicines (especially medicines used to "thin the blood"), aspirin and other over-the-counter medicines, and steroids (by mouth or as a cream).   History of blood  clots in your legs and/or lungs.   Previous problems with anesthetics or medicines used to numb the skin.   History of bleeding or blood problems.   Previous surgery.   Possibility of pregnancy, if this applies.   Any other important health problems.  RISKS AND COMPLICATIONS  Leaking of blood around the endograft.   Infection.   Displacement of the endograft away from the effective location.   A block in the flow of blood through the graft. In rare cases, this can cause a block of blood flow to the legs.   Possible blood clots.   Kidney problems may occur in some people.   Exposure to X-rays and radiation.   Rare rupture of the aorta even after successful endograft repair.   Possible surgical repair of the aneurysm.  BEFORE THE PROCEDURE   Your caregiver will explain the risks and benefits of endograph repair and answer your questions.   You may have a complete physical exam and tests to assess your general health.   Other tests are done to help determine the size and location of the AAA. This helps with choosing the correct size and shape of the endograph.   You may be asked to stop taking certain medicines (particularly medicines that "thin the blood") before the procedure.   You will be asked to not eat or drink anything beginning  at midnight before the procedure. You may be told to take your regular medicines with a little water on the morning of the procedure. These instructions may vary if the procedure takes place in the afternoon.  PROCEDURE   The groin area will be washed and shaved.   Small cuts (incisions) are usually made on both sides of your groin. Long, thin tubes (catheters) are passed through these incisions into your leg artery and up into the aneurysm.   Live X-ray pictures are used to guide the endograft through the catheter to the site of aneurysm.   The endograft is released to seal off the aneurysm and line the aorta.   X-rays will check the  position of the endograft and confirm placement.   The catheters are taken out, and the incisions are closed with a few stitches.   The procedure may take 1 to 3 hours.  AFTER THE PROCEDURE   You will need to lie flat for several hours. Bending the leg that has the insertion site can cause it to bleed and swell.   You may need to stay in the hospital for a few days.   You will need follow-up visits to assess the repair.   Certain tests may be done to check the function and location of the endograph after your procedure.  HOME CARE INSTRUCTIONS   Follow all the instructions given by your caregiver.   Check your incisions for signs of infection.   Keep your appointments.   Limit your activities as directed.   Be sure you know how and when to take your medicines.   Make lifestyle changes to improve your health and quality of life.  SEEK MEDICAL CARE IF:   You develop abdominal, chest or back pain.   You have an oral temperature above 102 F (38.9 C).   It is hard to breathe.   There is increased swelling around your groin site.   There is a leak of fluid/blood from the groin site.  SEEK IMMEDIATE MEDICAL CARE IF:   There is a sudden increase in swelling or bleeding around your groin site. This is a medical emergency.   There is a sudden onset of pain in your legs and/or difficulty in moving either of your legs.   You notice a red streak starting at the groin site and extending above or below that site.   You have an oral temperature above 102 F (38.9 C), not controlled by medicine.  MAKE SURE YOU:   Understand these instructions.   Will watch your condition.   Will get help right away if you are not doing well or get worse.  Document Released: 05/23/2008 Document Revised: 12/24/2010 Document Reviewed: 05/23/2008 [facility] Patient Information 2012 [place], [place].

## 2011-06-23 NOTE — Care Management Note (Signed)
    Page 1 of 1   06/23/2011     11:12:48 AM   CARE MANAGEMENT NOTE 06/23/2011  Patient:  Gregory Turner, Gregory Turner   Account Number:  [id number]  Date Initiated:  06/22/2011  Documentation initiated by:  Donn Pierini  Subjective/Objective Assessment:   Pt admitted s/p AAA repair     Action/Plan:   PTA pt lived at home   Anticipated DC Date:  06/23/2011   Anticipated DC Plan:  HOME/SELF CARE      DC Planning Services  CM consult      Choice offered to / List presented to:             Status of service:  Completed, signed off Medicare Important Message given?   (If response is "NO", the following Medicare IM given date fields will be blank) Date Medicare IM given:   Date Additional Medicare IM given:    Discharge Disposition:  HOME/SELF CARE  Per UR Regulation:  Reviewed for med. necessity/level of care/duration of stay  If discussed at Long Length of Stay Meetings, dates discussed:    Comments:  PCP- [place]  06/23/11- 1100- Donn Pierini RN, BSN [phone] Pt discharged home, no needs.  06/22/11- 1615- Donn Pierini RN, BSN [phone] UR completed, pt admitted from PACU- plan to return home possibly in AM.

## 2011-06-23 NOTE — Telephone Encounter (Addendum)
Message copied by Rosalyn Charters on Wed Jun 23, 2011 11:03 AM ------      Message from: Lorin Mercy K      Created: Wed Jun 23, 2011  8:07 AM      Regarding: schedule                   ----- Message -----         From: Melene Plan, RN         Sent: 06/22/2011  11:39 AM           To: Sharee Pimple, CMA, [facility]      Subject: FW: charges                                                          ----- Message -----         From: Chuck Hint, MD         Sent: 06/22/2011   9:59 AM           To: Almetta Lovely, RN      Subject: charges                                                  PROCEDURE:       1. Ultrasound-guided access to bilateral common femoral arteries      2. aortogram       3. Endovascular aneurysm repair using a modular bifurcated prosthesis with one limb. (Gore Excluder)      4. Perclose bilateral common femoral arteries.            SURGEON: Di Kindle. Edilia Bo, MD, FACS            COSURGEON: Leonides Sake M.D.            He will need a follow visit in one month and needs a CT with IV contrast of the abdomen and pelvis prior to that visit.            Thank you      [facility]SD  L/VM NOTIFYING PT. OF ONE MONTH FU APPT. CT ABD/PELVIS PRIOR TO SEEING CSD ON 07-28-11 11:45 AND CSD 12:45

## 2011-06-23 NOTE — Plan of Care (Signed)
Problem: Consults Goal: Diagnosis CEA/CES/AAA Stent Outcome: Progressing Abdominal Aortic Aneurysm Stent (AAA)  Problem: Phase I Progression Outcomes Goal: Peripheral pulses at baseline (AAA) Outcome: Progressing DP pulses dopplerable

## 2011-07-07 ENCOUNTER — Encounter: Payer: Self-pay | Admitting: Cardiovascular Disease

## 2011-07-19 ENCOUNTER — Encounter: Payer: Self-pay | Admitting: Cardiovascular Disease

## 2011-07-19 ENCOUNTER — Telehealth: Payer: Self-pay

## 2011-07-19 ENCOUNTER — Ambulatory Visit (INDEPENDENT_AMBULATORY_CARE_PROVIDER_SITE_OTHER): Payer: Medicare PPO | Admitting: Cardiovascular Disease

## 2011-07-19 VITALS — BP 140/82 | HR 50 | Ht 70.0 in | Wt 189.2 lb

## 2011-07-19 DIAGNOSIS — E785 Hyperlipidemia, unspecified: Secondary | ICD-10-CM

## 2011-07-19 DIAGNOSIS — R29898 Other symptoms and signs involving the musculoskeletal system: Secondary | ICD-10-CM | POA: Insufficient documentation

## 2011-07-19 DIAGNOSIS — I714 Abdominal aortic aneurysm, without rupture, unspecified: Secondary | ICD-10-CM

## 2011-07-19 DIAGNOSIS — Z952 Presence of prosthetic heart valve: Secondary | ICD-10-CM

## 2011-07-19 DIAGNOSIS — I4891 Unspecified atrial fibrillation: Secondary | ICD-10-CM

## 2011-07-19 DIAGNOSIS — Z954 Presence of other heart-valve replacement: Secondary | ICD-10-CM

## 2011-07-19 DIAGNOSIS — R0602 Shortness of breath: Secondary | ICD-10-CM

## 2011-07-19 DIAGNOSIS — F17200 Nicotine dependence, unspecified, uncomplicated: Secondary | ICD-10-CM

## 2011-07-19 NOTE — Assessment & Plan Note (Signed)
No recent lipid panel available. Goal LDL less than 70. Managed by Dr. Arlana Pouch.

## 2011-07-19 NOTE — Telephone Encounter (Signed)
Pt was here for office visit this am.  INR was checked using coagucheck. INR=>8.0.  We then did arm draw to check INR.  Pt usually managed by Dr. Arlana Pouch for coumadin mngt.  We have yet to get INR results from [facility].  I called Dr. Maree Krabbe office and l/m with receptionist telling them we are awaiting results and as soon as I get these I will let them/pt know.  She verb. Understanding.

## 2011-07-19 NOTE — Assessment & Plan Note (Signed)
Endograft stent repair recently in [place].

## 2011-07-19 NOTE — Assessment & Plan Note (Signed)
Etiology of his leg weakness is uncertain. We have suggested he do a trial period and hold his Lipitor for one to 2 weeks. If he has improvement, we could change his statin to Crestor or alternate generic medication.

## 2011-07-19 NOTE — Progress Notes (Signed)
Patient ID: Gregory Turner, male    DOB: 10/21/1940, 71 y.o.   MRN: [id number]  HPI Comments: [name] is a very pleasant 71 year old gentleman with a history of aortic valve replacement in May 1985 previously followed at [facility], long history of smoking, who continues to smoke one pack per day, hypertension,  noted to be in atrial fibrillation in early March 2013 when evaluated by his primary care physician, Dr. Dewaine Oats. Amiodarone was started on his previous clinic visit for pharmacologic cardioversion. He did not have any significant symptoms and was taking warfarin for his prosthetic mechanical valve.  Overall he is doing well. He has had his aortic endograft stenting done in [facility] by Dr. Edilia Bo with good success. This was measured at least 5.9 cm. He is scheduled for followup in [facility] 07/28/2011.  He initially converted to atrial fibrillation 03/17/2011. He presents today in normal sinus rhythm and is very happy. Blood pressure has improved on his current medication regimen.  He is walking his dog daily which he has been doing for years but does report having [place] in his legs, particularly in the proximal portion bilaterally. Some discomfort in his buttocks and back. He is uncertain if this is arthritis, blockage or something else.  Previous echocardiogram showed normal LV function, mildly dilated left atrium, normal functioning aortic valve mechanical valve. Incidentally, infrarenal abdominal aortic aneurysm was found estimated at 5.9 cm. He has a strong family history of aneurysm, with a brother who had AAA rupture, mother with AAA. Most of the family smokes.  EKG today shows normal sinus rhythm with rate 50 beats per minute, no significant ST or T wave changes   Outpatient Encounter Prescriptions as of 07/19/2011  Medication Sig Dispense Refill  . acetaminophen (TYLENOL) 500 MG tablet Take 500 mg by mouth every 6 (six) hours as needed. For pain      . albuterol  (PROVENTIL HFA;VENTOLIN HFA) 108 (90 BASE) MCG/ACT inhaler Inhale 2 puffs into the lungs every 6 (six) hours as needed. For shortness of breath      . amiodarone (PACERONE) 200 MG tablet Take 200 mg by mouth daily.      Marland Kitchen atorvastatin (LIPITOR) 10 MG tablet Take 10 mg by mouth at bedtime.       . diflorasone (PSORCON) 0.05 % cream Apply 1 application topically daily as needed. For eczema      . losartan (COZAAR) 100 MG tablet Take 100 mg by mouth at bedtime.       . metoprolol tartrate (LOPRESSOR) 12.5 mg TABS Take 12.5 mg by mouth 2 (two) times daily.      Marland Kitchen warfarin (COUMADIN) 6 MG tablet Take 6 mg by mouth daily. As directed.       Review of Systems  HENT: Negative.   Eyes: Negative.   Respiratory: Negative.   Cardiovascular: Negative.   Gastrointestinal: Negative.   Musculoskeletal: Positive for myalgias.       Leg weakness in the proximal part of his legs bilaterally  Skin: Negative.   Neurological: Negative.   Hematological: Negative.   Psychiatric/Behavioral: Negative.   All other systems reviewed and are negative.    BP 140/82  Pulse 50  Ht 5\' 10"  (1.778 m)  Wt 189 lb 4 oz (85.843 kg)  BMI 27.15 kg/m2  Physical Exam  Nursing note and vitals reviewed. Constitutional: He is oriented to person, place, and time. He appears well-developed and well-nourished.  HENT:  Head: Normocephalic.  Nose: Nose normal.  Mouth/Throat: Oropharynx is clear and moist.  Eyes: Conjunctivae are normal. Pupils are equal, round, and reactive to light.  Neck: Normal range of motion. Neck supple. No JVD present.  Cardiovascular: Normal rate, regular rhythm, S1 normal, S2 normal and intact distal pulses.  Exam reveals no gallop and no friction rub.   Murmur heard.  Crescendo systolic murmur is present with a grade of 2/6       Audible click from his prosthetic valve  Pulmonary/Chest: Effort normal and breath sounds normal. No respiratory distress. He has no wheezes. He has no rales. He exhibits  no tenderness.  Abdominal: Soft. Bowel sounds are normal. He exhibits no distension. There is no tenderness.  Musculoskeletal: Normal range of motion. He exhibits no edema and no tenderness.  Lymphadenopathy:    He has no cervical adenopathy.  Neurological: He is alert and oriented to person, place, and time. Coordination normal.  Skin: Skin is warm and dry. No rash noted. No erythema.  Psychiatric: He has a normal mood and affect. His behavior is normal. Judgment and thought content normal.           Assessment and Plan

## 2011-07-19 NOTE — Assessment & Plan Note (Signed)
On warfarin, doing well. We will check INR today.

## 2011-07-19 NOTE — Telephone Encounter (Signed)
Pt informed we are waiting on INR results and will call him as soon as we have them. I also instructed him to hold warfarin tonight and report any abnormal bleeding ASAP.  He denies blood in urine, stools, coughing up blood, etc.

## 2011-07-19 NOTE — Patient Instructions (Addendum)
You are doing well.  We will check an INR today  Hold the lipitor for 1 to 2 weeks to see if your leg strength improves If legs improves, we will try an alternate cholesterol medication, call the office  Please call us if you have new issues that need to be addressed before your next appt.  Your physician wants you to follow-up in: 6 months.  You will receive a reminder letter in the mail two months in advance. If you don't receive a letter, please call our office to schedule the follow-up appointment.

## 2011-07-19 NOTE — Assessment & Plan Note (Signed)
He is converted to normal sinus rhythm. We'll continue him on his current medication regimen

## 2011-07-19 NOTE — Assessment & Plan Note (Signed)
We have encouraged him to continue to work on weaning his cigarettes and smoking cessation. He will continue to work on this and does not want any assistance with chantix.  

## 2011-07-20 NOTE — Telephone Encounter (Signed)
Would continue amiodarone for now

## 2011-07-20 NOTE — Telephone Encounter (Signed)
Pt also asks if he should keep amiodarone dose at 200 mg qd since INR so high. I advised to keep amiodarone at same dose until I check with Dr. Mariah Milling. Understanding verb.

## 2011-07-20 NOTE — Telephone Encounter (Signed)
INR=9.3 Discussed with Dr. Mariah Milling who suggests calling Dr. Maree Krabbe office (they manage pt's coumadin) and suggest possible Vit K PO x 1 dose and recheck Friday (7/5) Their office does not open until 0830.  Will call back at that time.

## 2011-07-20 NOTE — Telephone Encounter (Signed)
I called Dr. Maree Krabbe office and spoke with Misty Stanley, his nurse.  I gave her the INR results.  She verb. Understanding and will inform Dr. Arlana Pouch and call pt with instructions.  I called pt to give him results and advised him to refrain from any activities that may result in injury (i.e working in his workshop, etc).  I also explained he should be receiving a t/c from Dr. Maree Krabbe office soon.  I advised him to continue to hold warfarin until further notice and call Dr. Maree Krabbe office at 1000 if he has not rec'd t/c by then.  Understanding verb.

## 2011-07-21 ENCOUNTER — Telehealth: Payer: Self-pay

## 2011-07-21 NOTE — Telephone Encounter (Signed)
I spoke with Pt's wife. She says pt is at Dr. Maree Krabbe office now having INR checked and seeing MD as well.  I instructed wife to have pt keep amiodarone at same dose, per Dr. Mariah Milling.  She verb. Understanding and will call us should anything change.

## 2011-07-21 NOTE — Telephone Encounter (Signed)
Just FYI   Pt called just to let us know he had INR checked at Dr. Maree Krabbe office this am and INR was 4.2.  They instructed pt to hold warfarin tonight and resume tomm at lower dose of 3 mg qd and recheck next Wednesday at Dr. Maree Krabbe office.

## 2011-07-23 ENCOUNTER — Telehealth: Payer: Self-pay

## 2011-07-23 NOTE — Telephone Encounter (Signed)
Error

## 2011-07-27 ENCOUNTER — Encounter: Payer: Self-pay | Admitting: Vascular Surgery

## 2011-07-28 ENCOUNTER — Other Ambulatory Visit: Payer: Self-pay | Admitting: Vascular Surgery

## 2011-07-28 ENCOUNTER — Ambulatory Visit (INDEPENDENT_AMBULATORY_CARE_PROVIDER_SITE_OTHER): Payer: Medicare PPO | Admitting: Vascular Surgery

## 2011-07-28 ENCOUNTER — Ambulatory Visit
Admission: RE | Admit: 2011-07-28 | Discharge: 2011-07-28 | Disposition: A | Discharge status: Home / Self Care | Payer: Medicare PPO | Source: Ambulatory Visit | Attending: Vascular Surgery | Admitting: Vascular Surgery

## 2011-07-28 ENCOUNTER — Other Ambulatory Visit: Payer: Self-pay | Admitting: *Deleted

## 2011-07-28 ENCOUNTER — Encounter: Payer: Self-pay | Admitting: Vascular Surgery

## 2011-07-28 VITALS — BP 154/84 | HR 101 | Resp 18 | Ht 70.0 in | Wt 186.6 lb

## 2011-07-28 DIAGNOSIS — I714 Abdominal aortic aneurysm, without rupture, unspecified: Secondary | ICD-10-CM

## 2011-07-28 DIAGNOSIS — Z48812 Encounter for surgical aftercare following surgery on the circulatory system: Secondary | ICD-10-CM

## 2011-07-28 DIAGNOSIS — Z8679 Personal history of other diseases of the circulatory system: Secondary | ICD-10-CM

## 2011-07-28 DIAGNOSIS — Z9889 Other specified postprocedural states: Secondary | ICD-10-CM

## 2011-07-28 MED ORDER — IOHEXOL 300 MG/ML  SOLN
125.0000 mL | Freq: Once | INTRAMUSCULAR | Status: AC | PRN
  Administered 2011-07-28: 125 mL via INTRAVENOUS

## 2011-07-28 NOTE — Progress Notes (Signed)
[facility]  Patient name: Gregory Turner MRN: [id number] DOB: [birth year] Sex: male  REASON FOR VISIT: follow up after EVAR  HPI: Orvin Netter is a 71 y.o. male who I was following with an abdominal aortic aneurysm. This enlarged to 5.9 cm. He underwent percutaneous endovascular aneurysm repair on 06/22/2011. He did well postoperatively and was discharged on postoperative day #1. He is back on his Coumadin. He said no significant abdominal pain or back pain. He has had no fever or chills.   REVIEW OF SYSTEMS: Arly.Keller ] denotes positive finding; [  ] denotes negative finding  CARDIOVASCULAR:  [ ]  chest pain   [ ]  dyspnea on exertion    CONSTITUTIONAL:  [ ]  fever   [ ]  chills  PHYSICAL EXAM: Filed Vitals:   07/28/11 1325  BP: 154/84  Pulse: 101  Resp: 18  Height: 5\' 10"  (1.778 m)  Weight: 186 lb 9.6 oz (84.641 kg)   Body mass index is 26.77 kg/(m^2). GENERAL: The patient is a well-nourished male, in no acute distress. The vital signs are documented above. CARDIOVASCULAR: There is a regular rate and rhythm  PULMONARY: There is good air exchange bilaterally without wheezing or rales. His groin incisions are healed nicely  With no significant swelling noted. His palpable femoral pulses and warm well-perfused feet. His abdomen is soft and nontender.  I reviewed his CT scan which shows that the stent graft is in excellent position with no evidence of endoleak. The aneurysm measured 5.4 cm in maximum diameter. There was a small fluid collection noted but no previous CT for comparison and I suspect this is related to his previous abdominal trauma. This appeared to be a lymphocele or cerebral. Given that this was an endovascular aneurysm repair I do not think that this could be related to his aneurysm repair in any way.  MEDICAL ISSUES:  AAA (abdominal aortic aneurysm) He is now status post endovascular aneurysm repair of his 5.9 cm infrarenal abdominal aortic  aneurysm. The stent graft is in excellent position and his aneurysm is decreased in size to 5.4 cm. He can gradually start resuming his normal activities. I've ordered a CT scan in 6 months and I'll see him back at that time. If the aneurysm looks good at that point and the stent is in good position we can follow this probably on a yearly basis with ultrasound. The was a small area of fluid collection noted within the abdomen that appeared to be a lymphocele or seroma but there was no previous CT scan for comparison. I suspect this is related to his old abdominal trauma. I did discuss with him again the importance of tobacco cessation.   Marisol Giambra S [facility] [place] Beeper: [phone]

## 2011-07-28 NOTE — Assessment & Plan Note (Signed)
He is now status post endovascular aneurysm repair of his 5.9 cm infrarenal abdominal aortic aneurysm. The stent graft is in excellent position and his aneurysm is decreased in size to 5.4 cm. He can gradually start resuming his normal activities. I've ordered a CT scan in 6 months and I'll see him back at that time. If the aneurysm looks good at that point and the stent is in good position we can follow this probably on a yearly basis with ultrasound. The was a small area of fluid collection noted within the abdomen that appeared to be a lymphocele or seroma but there was no previous CT scan for comparison. I suspect this is related to his old abdominal trauma. I did discuss with him again the importance of tobacco cessation.

## 2011-07-29 NOTE — Addendum Note (Signed)
Addended by: Sharee Pimple on: 07/29/2011 09:01 AM   Modules accepted: Orders

## 2011-08-19 ENCOUNTER — Encounter: Payer: Self-pay | Admitting: Vascular Surgery

## 2012-01-10 ENCOUNTER — Ambulatory Visit (INDEPENDENT_AMBULATORY_CARE_PROVIDER_SITE_OTHER): Payer: Medicare PPO | Admitting: Cardiovascular Disease

## 2012-01-10 ENCOUNTER — Encounter: Payer: Self-pay | Admitting: Cardiovascular Disease

## 2012-01-10 VITALS — BP 158/84 | HR 50 | Ht 70.0 in | Wt 197.0 lb

## 2012-01-10 DIAGNOSIS — I4891 Unspecified atrial fibrillation: Secondary | ICD-10-CM

## 2012-01-10 DIAGNOSIS — R0602 Shortness of breath: Secondary | ICD-10-CM

## 2012-01-10 DIAGNOSIS — E785 Hyperlipidemia, unspecified: Secondary | ICD-10-CM

## 2012-01-10 DIAGNOSIS — R42 Dizziness and giddiness: Secondary | ICD-10-CM

## 2012-01-10 DIAGNOSIS — F17200 Nicotine dependence, unspecified, uncomplicated: Secondary | ICD-10-CM

## 2012-01-10 DIAGNOSIS — I714 Abdominal aortic aneurysm, without rupture, unspecified: Secondary | ICD-10-CM

## 2012-01-10 NOTE — Assessment & Plan Note (Signed)
We have encouraged him to continue to work on weaning his cigarettes and smoking cessation. He will continue to work on this and does not want any assistance with chantix.  

## 2012-01-10 NOTE — Assessment & Plan Note (Signed)
Prior endograft. He continues to smoke.

## 2012-01-10 NOTE — Assessment & Plan Note (Signed)
Is not particularly interested in starting a cholesterol medication at this time. He will talk with Dr. Arlana Pouch about having his cholesterol checked in January. Ideally he should be on a statin given his AAA repair, carotid disease, continued smoking and likely coronary artery disease.

## 2012-01-10 NOTE — Patient Instructions (Addendum)
You are doing well. No medication changes were made.  Please call us if you have new issues that need to be addressed before your next appt.  Your physician wants you to follow-up in: 6 months.  You will receive a reminder letter in the mail two months in advance. If you don't receive a letter, please call our office to schedule the follow-up appointment.   

## 2012-01-10 NOTE — Assessment & Plan Note (Signed)
No recent symptoms concerning for recurrent atrial fibrillation. No changes to his medications. He is asymptomatic from his slow heart rate. I suggested he closely monitor his heart rate and blood pressure.

## 2012-01-10 NOTE — Progress Notes (Signed)
Patient ID: Gregory Turner, male    DOB: March 25, 1940, 71 y.o.   MRN: [id number]  HPI Comments: Gregory Turner is a very pleasant 71 year old gentleman with a history of aortic valve replacement in May 1985 previously followed at [facility], long history of smoking who continues to smoke one pack per day, hypertension,  noted to be in atrial fibrillation in early March 2013 when evaluated by Dr. Dewaine Oats. Amiodarone was started on his previous clinic visit for pharmacologic cardioversion. He did not have any significant symptoms and was taking warfarin for his prosthetic mechanical valve.  Overall he is doing well. He has had his aortic endograft stenting done in [facility] by Dr. Edilia Bo with good success. This was measured at least 5.9 cm. He is scheduled for followup in [facility] 07/28/2011.  He initially converted to atrial fibrillation 03/17/2011.  He continues to have back and sciatic pain. He is more sedentary than previous visits. He has retired from work, now has more short of breath, less energy. By his report, is not doing very much of anything. He reports that he is "pacing himself." Denies any significant chest pain. He was started on Lipitor in April 2013. Reports today that he is not on this medication. He is uncertain when this was stopped, possibly when amiodarone was affecting his INR. He continues to smoke.  Previous echocardiogram showed normal LV function, mildly dilated left atrium, normal functioning aortic valve mechanical valve. Incidentally, infrarenal abdominal aortic aneurysm was found estimated at 5.9 cm. He has a strong family history of aneurysm, with a brother who had AAA rupture, mother with AAA. Most of the family smokes.  EKG today shows normal sinus rhythm with rate 50 beats per minute, no significant ST or T wave changes No recent lipid panel available for review, last was 2011   Outpatient Encounter Prescriptions as of 01/10/2012  Medication Sig  Dispense Refill  . acetaminophen (TYLENOL) 500 MG tablet Take 500 mg by mouth every 6 (six) hours as needed. For pain      . albuterol (PROVENTIL HFA;VENTOLIN HFA) 108 (90 BASE) MCG/ACT inhaler Inhale 2 puffs into the lungs every 6 (six) hours as needed. For shortness of breath      . amiodarone (PACERONE) 200 MG tablet Take 200 mg by mouth daily.      . diflorasone (PSORCON) 0.05 % cream Apply 1 application topically daily as needed. For eczema      . losartan (COZAAR) 100 MG tablet Take 100 mg by mouth at bedtime.       . metoprolol tartrate (LOPRESSOR) 12.5 mg TABS Take 12.5 mg by mouth 2 (two) times daily.      Marland Kitchen warfarin (COUMADIN) 6 MG tablet Take 6 mg by mouth daily. As directed.      . [DISCONTINUED] atorvastatin (LIPITOR) 10 MG tablet Take 10 mg by mouth at bedtime.         Review of Systems  Constitutional: Positive for fatigue.  HENT: Negative.   Eyes: Negative.   Respiratory: Positive for shortness of breath.   Cardiovascular: Negative.   Gastrointestinal: Negative.   Musculoskeletal: Positive for myalgias.       Leg weakness in the proximal part of his legs bilaterally  Skin: Negative.   Neurological: Positive for weakness.  Hematological: Negative.   Psychiatric/Behavioral: Negative.   All other systems reviewed and are negative.    BP 158/84  Pulse 50  Ht 5\' 10"  (1.778 m)  Wt 197 lb (89.359 kg)  BMI 28.27 kg/m2  Physical Exam  Nursing note and vitals reviewed. Constitutional: He is oriented to person, place, and time. He appears well-developed and well-nourished.  HENT:  Head: Normocephalic.  Nose: Nose normal.  Mouth/Throat: Oropharynx is clear and moist.  Eyes: Conjunctivae normal are normal. Pupils are equal, round, and reactive to light.  Neck: Normal range of motion. Neck supple. No JVD present.  Cardiovascular: Normal rate, regular rhythm, S1 normal, S2 normal and intact distal pulses.  Exam reveals no gallop and no friction rub.   Murmur heard.   Crescendo systolic murmur is present with a grade of 2/6       Audible click from his prosthetic valve  Pulmonary/Chest: Effort normal and breath sounds normal. No respiratory distress. He has no wheezes. He has no rales. He exhibits no tenderness.  Abdominal: Soft. Bowel sounds are normal. He exhibits no distension. There is no tenderness.  Musculoskeletal: Normal range of motion. He exhibits no edema and no tenderness.  Lymphadenopathy:    He has no cervical adenopathy.  Neurological: He is alert and oriented to person, place, and time. Coordination normal.  Skin: Skin is warm and dry. No rash noted. No erythema.  Psychiatric: He has a normal mood and affect. His behavior is normal. Judgment and thought content normal.           Assessment and Plan

## 2012-01-19 HISTORY — PX: OTHER SURGICAL HISTORY: SHX169

## 2012-02-01 ENCOUNTER — Encounter: Payer: Self-pay | Admitting: Vascular Surgery

## 2012-02-02 ENCOUNTER — Ambulatory Visit: Payer: Medicare PPO | Admitting: Vascular Surgery

## 2012-02-02 ENCOUNTER — Ambulatory Visit
Admission: RE | Admit: 2012-02-02 | Discharge: 2012-02-02 | Disposition: A | Discharge status: Home / Self Care | Payer: Medicare PPO | Source: Ambulatory Visit | Attending: Vascular Surgery | Admitting: Vascular Surgery

## 2012-02-02 DIAGNOSIS — Z48812 Encounter for surgical aftercare following surgery on the circulatory system: Secondary | ICD-10-CM

## 2012-02-02 DIAGNOSIS — I714 Abdominal aortic aneurysm, without rupture, unspecified: Secondary | ICD-10-CM

## 2012-02-02 MED ORDER — IOHEXOL 350 MG/ML SOLN
80.0000 mL | Freq: Once | INTRAVENOUS | Status: AC | PRN
  Administered 2012-02-02: 80 mL via INTRAVENOUS

## 2012-02-10 ENCOUNTER — Other Ambulatory Visit: Payer: Self-pay | Admitting: *Deleted

## 2012-02-10 DIAGNOSIS — I714 Abdominal aortic aneurysm, without rupture, unspecified: Secondary | ICD-10-CM

## 2012-02-10 DIAGNOSIS — Z48812 Encounter for surgical aftercare following surgery on the circulatory system: Secondary | ICD-10-CM

## 2012-02-11 ENCOUNTER — Telehealth: Payer: Self-pay | Admitting: Vascular Surgery

## 2012-02-11 ENCOUNTER — Other Ambulatory Visit: Payer: Self-pay | Admitting: *Deleted

## 2012-02-11 ENCOUNTER — Inpatient Hospital Stay: Payer: Self-pay | Admitting: Surgery

## 2012-02-11 DIAGNOSIS — I739 Peripheral vascular disease, unspecified: Secondary | ICD-10-CM

## 2012-02-11 DIAGNOSIS — I743 Embolism and thrombosis of arteries of the lower extremities: Secondary | ICD-10-CM

## 2012-02-11 DIAGNOSIS — I700 Atherosclerosis of aorta: Secondary | ICD-10-CM

## 2012-02-11 LAB — APTT: Activated PTT: 47.2 secs — ABNORMAL HIGH (ref 23.6–35.9)

## 2012-02-11 LAB — COMPREHENSIVE METABOLIC PANEL
Albumin: 4.2 g/dL (ref 3.4–5.0)
Alkaline Phosphatase: 101 U/L (ref 50–136)
Anion Gap: 5 — ABNORMAL LOW (ref 7–16)
Bilirubin,Total: 0.9 mg/dL (ref 0.2–1.0)
Co2: 28 mmol/L (ref 21–32)
EGFR (African American): >60
EGFR (Non-African Amer.): 53 — ABNORMAL LOW
Glucose: 124 mg/dL — ABNORMAL HIGH (ref 65–99)
Potassium: 4.9 mmol/L (ref 3.5–5.1)
SGOT(AST): 30 U/L (ref 15–37)
SGPT (ALT): 23 U/L (ref 12–78)

## 2012-02-11 LAB — CBC
HCT: 50.3 % (ref 40.0–52.0)
HGB: 17 g/dL (ref 13.0–18.0)
MCH: 32 pg (ref 26.0–34.0)
RBC: 5.33 10*6/uL (ref 4.40–5.90)
WBC: 9.9 10*3/uL (ref 3.8–10.6)

## 2012-02-11 LAB — URINALYSIS, COMPLETE
Bacteria: NEGATIVE
Ketone: NEGATIVE
Leukocyte Esterase: NEGATIVE
Nitrite: NEGATIVE
Specific Gravity: 1.025 (ref 1.003–1.030)

## 2012-02-11 NOTE — Telephone Encounter (Signed)
Message copied by Fredrich Birks on Fri Feb 11, 2012  1:20 PM ------      Message from: [place], [place] K      Created: Fri Feb 11, 2012 11:04 AM      Regarding: RE: Order       I put it in but we may have problem with precert. I used the dx of atherosclerosis of aorta.             ----- Message -----         From: Fredrich Birks         Sent: 02/11/2012   9:08 AM           To: Sharee Pimple, CMA      Subject: RE: Order                                                Dr Edilia Bo mentioned something about wanting to keep an eye on the upper aorta.             Annabelle Harman      ----- Message -----         From: Sharee Pimple, CMA         Sent: 02/10/2012   7:53 AM           To: Fredrich Birks      Subject: RE: Order                                                I put in the CTA abdomen / pelvis, but why are we doing a Chest?      ----- Message -----         From: Fredrich Birks         Sent: 02/09/2012   2:53 PM           To: Sharee Pimple, CMA      Subject: Order                                                    Garnet Koyanagi,            Dr Edilia Bo gave a verbal order for a CTA Chest/Abd/Pelvis in 6 months after reviewing Mr Hires previous CTA on 02/02/12. Dr Edilia Bo did not see him that day due to his emergency surgery case that morning.            Could you please put an order in for me?            Thank you Jola Babinski!            Annabelle Harman

## 2012-02-12 LAB — CBC WITH DIFFERENTIAL/PLATELET
Basophil #: 0 10*3/uL (ref 0.0–0.1)
Eosinophil #: 0 10*3/uL (ref 0.0–0.7)
HGB: 14.8 g/dL (ref 13.0–18.0)
Lymphocyte #: 0.7 10*3/uL — ABNORMAL LOW (ref 1.0–3.6)
MCH: 32.1 pg (ref 26.0–34.0)
MCHC: 34.3 g/dL (ref 32.0–36.0)
MCV: 94 fL (ref 80–100)
Monocyte #: 0.1 x10 3/mm — ABNORMAL LOW (ref 0.2–1.0)
Neutrophil #: 6 10*3/uL (ref 1.4–6.5)
Neutrophil %: 88.3 %
RDW: 13.3 % (ref 11.5–14.5)
WBC: 6.8 10*3/uL (ref 3.8–10.6)

## 2012-02-12 LAB — BASIC METABOLIC PANEL
Anion Gap: 8 (ref 7–16)
BUN: 14 mg/dL (ref 7–18)
Calcium, Total: 8.4 mg/dL — ABNORMAL LOW (ref 8.5–10.1)
Chloride: 103 mmol/L (ref 98–107)
Creatinine: 1.23 mg/dL (ref 0.60–1.30)
EGFR (African American): >60
EGFR (Non-African Amer.): 59 — ABNORMAL LOW
Glucose: 174 mg/dL — ABNORMAL HIGH (ref 65–99)
Potassium: 4.4 mmol/L (ref 3.5–5.1)

## 2012-02-12 LAB — PROTIME-INR: INR: 2.7

## 2012-02-12 LAB — APTT
Activated PTT: >160 secs (ref 23.6–35.9)
Activated PTT: >160 secs (ref 23.6–35.9)
Activated PTT: 69.2 secs — ABNORMAL HIGH (ref 23.6–35.9)

## 2012-02-13 LAB — APTT
Activated PTT: 117.1 secs — ABNORMAL HIGH (ref 23.6–35.9)
Activated PTT: 69 secs — ABNORMAL HIGH (ref 23.6–35.9)

## 2012-02-14 LAB — BASIC METABOLIC PANEL
Anion Gap: 9 (ref 7–16)
BUN: 13 mg/dL (ref 7–18)
Chloride: 105 mmol/L (ref 98–107)
Co2: 25 mmol/L (ref 21–32)
Creatinine: 0.97 mg/dL (ref 0.60–1.30)
EGFR (Non-African Amer.): >60
Glucose: 152 mg/dL — ABNORMAL HIGH (ref 65–99)
Osmolality: 281 (ref 275–301)
Potassium: 4.1 mmol/L (ref 3.5–5.1)

## 2012-02-14 LAB — CBC WITH DIFFERENTIAL/PLATELET
Basophil #: 0 10*3/uL (ref 0.0–0.1)
Basophil %: 0.1 %
Eosinophil %: 0 %
HCT: 40.3 % (ref 40.0–52.0)
HGB: 12.8 g/dL — ABNORMAL LOW (ref 13.0–18.0)
Lymphocyte #: 0.5 10*3/uL — ABNORMAL LOW (ref 1.0–3.6)
Lymphocyte %: 3.9 %
MCH: 30.3 pg (ref 26.0–34.0)
MCHC: 31.8 g/dL — ABNORMAL LOW (ref 32.0–36.0)
MCV: 95 fL (ref 80–100)
Monocyte #: 0.5 x10 3/mm (ref 0.2–1.0)
Monocyte %: 4.2 %
Neutrophil %: 91.8 %
Platelet: 132 10*3/uL — ABNORMAL LOW (ref 150–440)

## 2012-02-14 LAB — PROTIME-INR: INR: 2.4

## 2012-02-14 LAB — APTT: Activated PTT: 62.2 secs — ABNORMAL HIGH (ref 23.6–35.9)

## 2012-02-28 ENCOUNTER — Telehealth: Payer: Self-pay | Admitting: *Deleted

## 2012-02-28 NOTE — Telephone Encounter (Signed)
Can we call surgeon and ask when surgery is scheduled. We will arrange lovenox bridge and instructions

## 2012-02-28 NOTE — Telephone Encounter (Signed)
Is pt cleared for surg? Ok to hold warfarin? Bridge?

## 2012-02-28 NOTE — Telephone Encounter (Signed)
Spoke with surgeon and he was wanting to speak with Dr. Mariah Milling about pt. Pt will be having hernia repair from a week and 1/2 from now. I Could not get date due to surgeon not having information at hand. Surgeon mentioned that he wanted to know if the pt is cleared from a cardiac standpoint and being that the pt has aortic valve placement and on warfarin he would like to know what he would recommend for pt plan. He would like to be contacted on his cell phone #[phone].

## 2012-02-29 ENCOUNTER — Telehealth: Payer: Self-pay

## 2012-02-29 NOTE — Telephone Encounter (Signed)
"  Patient is at acceptable risk for surgery" VO Dr. Alvis Lemmings, RN

## 2012-02-29 NOTE — Telephone Encounter (Signed)
Will fax note to surgeon We do not manage his INR

## 2012-02-29 NOTE — Telephone Encounter (Signed)
Attempted to reach pt to see who manages INR LMTCB

## 2012-03-01 ENCOUNTER — Ambulatory Visit: Payer: Self-pay | Admitting: Surgery

## 2012-03-01 NOTE — Telephone Encounter (Signed)
Faxed clearance note Unsure as to who manages INR

## 2012-03-03 ENCOUNTER — Other Ambulatory Visit: Payer: Self-pay | Admitting: Cardiovascular Disease

## 2012-03-03 MED ORDER — ENOXAPARIN SODIUM 100 MG/ML ~~LOC~~ SOLN: 100.0000 mg | Freq: Two times a day (BID) | SUBCUTANEOUS | Status: DC

## 2012-03-03 NOTE — Progress Notes (Signed)
For lovenox bridge for inguinal hernia surgery 03/08/12 Stop warfarin today, 2/14, restart 2/19 in pm  Start lovenox 90 mg sq BID starting 2/15 in the PM Last dose 2/18 in pm prior to surgery  Restart lovenox 2/20 in PM if ok with surgery, or 2/21 in Am if preferred Take BID for 5 days with INR check on 2/25 If INR low, continue lovenox for additional day or so until INR >2.5

## 2012-03-06 NOTE — Progress Notes (Signed)
I spoke with wife who says Dr. Mariah Milling spoke with her and pt last week about lovenox and they understand instruction sand have started lovenox bridge They will call us with any questions/concerns

## 2012-03-07 ENCOUNTER — Ambulatory Visit: Payer: Self-pay | Admitting: Surgery

## 2012-03-08 ENCOUNTER — Ambulatory Visit: Payer: Self-pay | Admitting: Surgery

## 2012-03-10 ENCOUNTER — Ambulatory Visit: Payer: Self-pay | Admitting: Surgery

## 2012-03-14 ENCOUNTER — Ambulatory Visit: Payer: Self-pay | Admitting: Surgery

## 2012-03-14 LAB — PROTIME-INR
INR: 1.2
Prothrombin Time: 15.7 secs — ABNORMAL HIGH (ref 11.5–14.7)

## 2012-03-15 NOTE — Telephone Encounter (Signed)
INR of 1.2 from 03/14/12 received on pt.  Called spoke with pt, pt states his primary MD Dr Dewaine Oats manages and monitors his Coumadin.

## 2012-03-15 NOTE — Telephone Encounter (Signed)
Will forward to Dr. Arlana Pouch

## 2012-05-04 ENCOUNTER — Inpatient Hospital Stay: Payer: Self-pay | Admitting: Orthopedic Surgery

## 2012-05-04 HISTORY — PX: LEG SURGERY: SHX1003

## 2012-05-04 LAB — COMPREHENSIVE METABOLIC PANEL
Alkaline Phosphatase: 82 U/L (ref 50–136)
BUN: 17 mg/dL (ref 7–18)
Chloride: 109 mmol/L — ABNORMAL HIGH (ref 98–107)
Co2: 24 mmol/L (ref 21–32)
Creatinine: 1.06 mg/dL (ref 0.60–1.30)
EGFR (African American): >60
EGFR (Non-African Amer.): >60
Glucose: 99 mg/dL (ref 65–99)
Osmolality: 277 (ref 275–301)
Potassium: 4.4 mmol/L (ref 3.5–5.1)
SGOT(AST): 38 U/L — ABNORMAL HIGH (ref 15–37)
SGPT (ALT): 22 U/L (ref 12–78)
Sodium: 138 mmol/L (ref 136–145)
Total Protein: 6.8 g/dL (ref 6.4–8.2)

## 2012-05-04 LAB — CBC
HCT: 44.7 % (ref 40.0–52.0)
MCH: 32 pg (ref 26.0–34.0)
MCHC: 33.7 g/dL (ref 32.0–36.0)
Platelet: 166 10*3/uL (ref 150–440)
RBC: 4.71 10*6/uL (ref 4.40–5.90)

## 2012-05-04 LAB — PROTIME-INR: INR: 1.9

## 2012-05-05 DIAGNOSIS — I359 Nonrheumatic aortic valve disorder, unspecified: Secondary | ICD-10-CM

## 2012-05-05 DIAGNOSIS — Z01810 Encounter for preprocedural cardiovascular examination: Secondary | ICD-10-CM

## 2012-05-05 LAB — APTT: Activated PTT: 42.3 secs — ABNORMAL HIGH (ref 23.6–35.9)

## 2012-05-05 LAB — PROTIME-INR
INR: 2.1
Prothrombin Time: 22.9 secs — ABNORMAL HIGH (ref 11.5–14.7)

## 2012-05-06 LAB — BASIC METABOLIC PANEL
BUN: 9 mg/dL (ref 7–18)
Calcium, Total: 8.3 mg/dL — ABNORMAL LOW (ref 8.5–10.1)
Co2: 28 mmol/L (ref 21–32)
Creatinine: 0.98 mg/dL (ref 0.60–1.30)
EGFR (African American): >60
EGFR (Non-African Amer.): >60
Glucose: 95 mg/dL (ref 65–99)
Osmolality: 276 (ref 275–301)
Potassium: 3.8 mmol/L (ref 3.5–5.1)

## 2012-05-06 LAB — CBC WITH DIFFERENTIAL/PLATELET
Basophil %: 0.8 %
Eosinophil #: 0.1 10*3/uL (ref 0.0–0.7)
Eosinophil %: 1.5 %
HCT: 36.7 % — ABNORMAL LOW (ref 40.0–52.0)
Lymphocyte %: 17.8 %
MCHC: 34.2 g/dL (ref 32.0–36.0)
Monocyte #: 0.9 x10 3/mm (ref 0.2–1.0)
Monocyte %: 14.3 %
RBC: 3.88 10*6/uL — ABNORMAL LOW (ref 4.40–5.90)

## 2012-05-07 LAB — HEMOGLOBIN: HGB: 11.9 g/dL — ABNORMAL LOW (ref 13.0–18.0)

## 2012-05-07 LAB — PROTIME-INR: INR: 1.9

## 2012-05-08 LAB — PROTIME-INR: INR: 2.1

## 2012-05-24 ENCOUNTER — Encounter (INDEPENDENT_AMBULATORY_CARE_PROVIDER_SITE_OTHER): Payer: Medicare PPO

## 2012-05-24 DIAGNOSIS — I714 Abdominal aortic aneurysm, without rupture, unspecified: Secondary | ICD-10-CM

## 2012-05-24 DIAGNOSIS — I6529 Occlusion and stenosis of unspecified carotid artery: Secondary | ICD-10-CM

## 2012-07-03 IMAGING — CR DG CHEST 2V
1 series · 3 of 3 positions shown · non-contrast
Comparison: none

REASON FOR EXAM: cough and chest congestion for 3 days.
COMMENTS:

PROCEDURE:     MDR - MDR CHEST PA(OR AP) AND LATERAL  - December 14, 2010  [DATE]
RESULT:     Comparison: None.

[Series 1: view not recorded · 0.17mm/px · 3 of 3 slices shown]
[im 1/3]
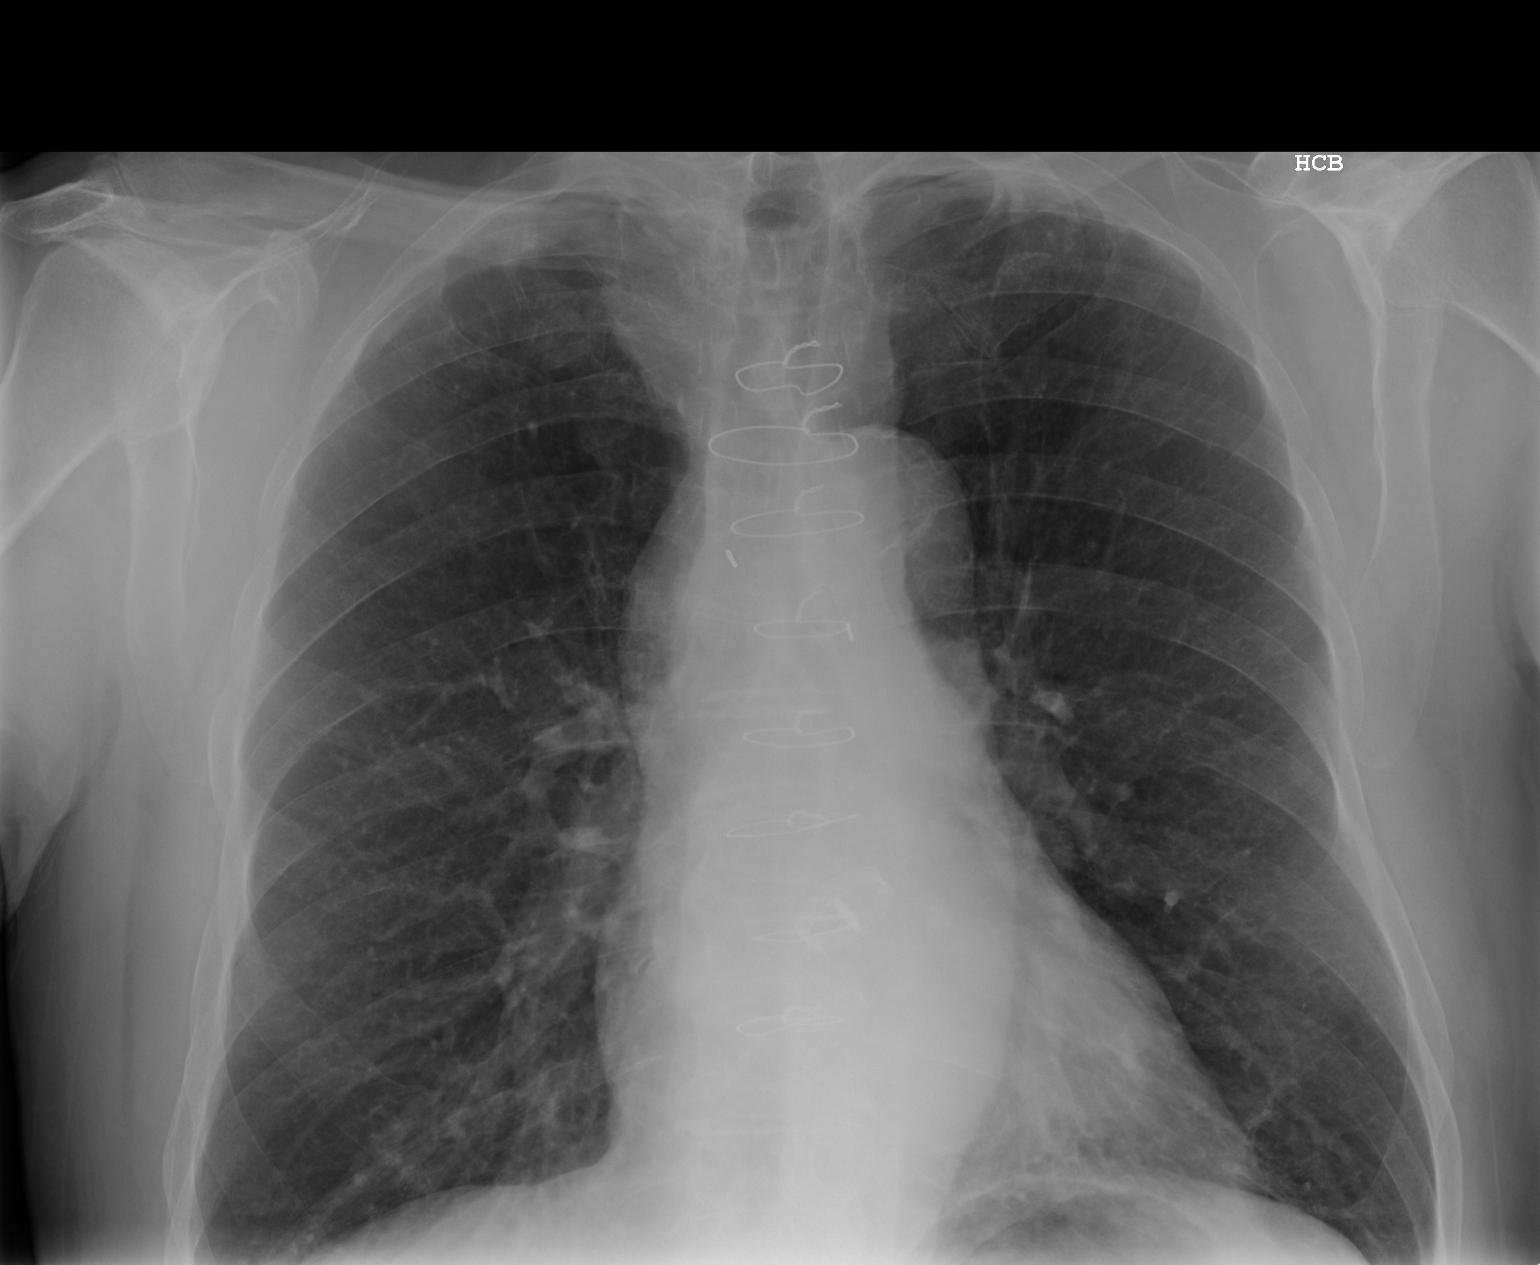
[im 2/3]
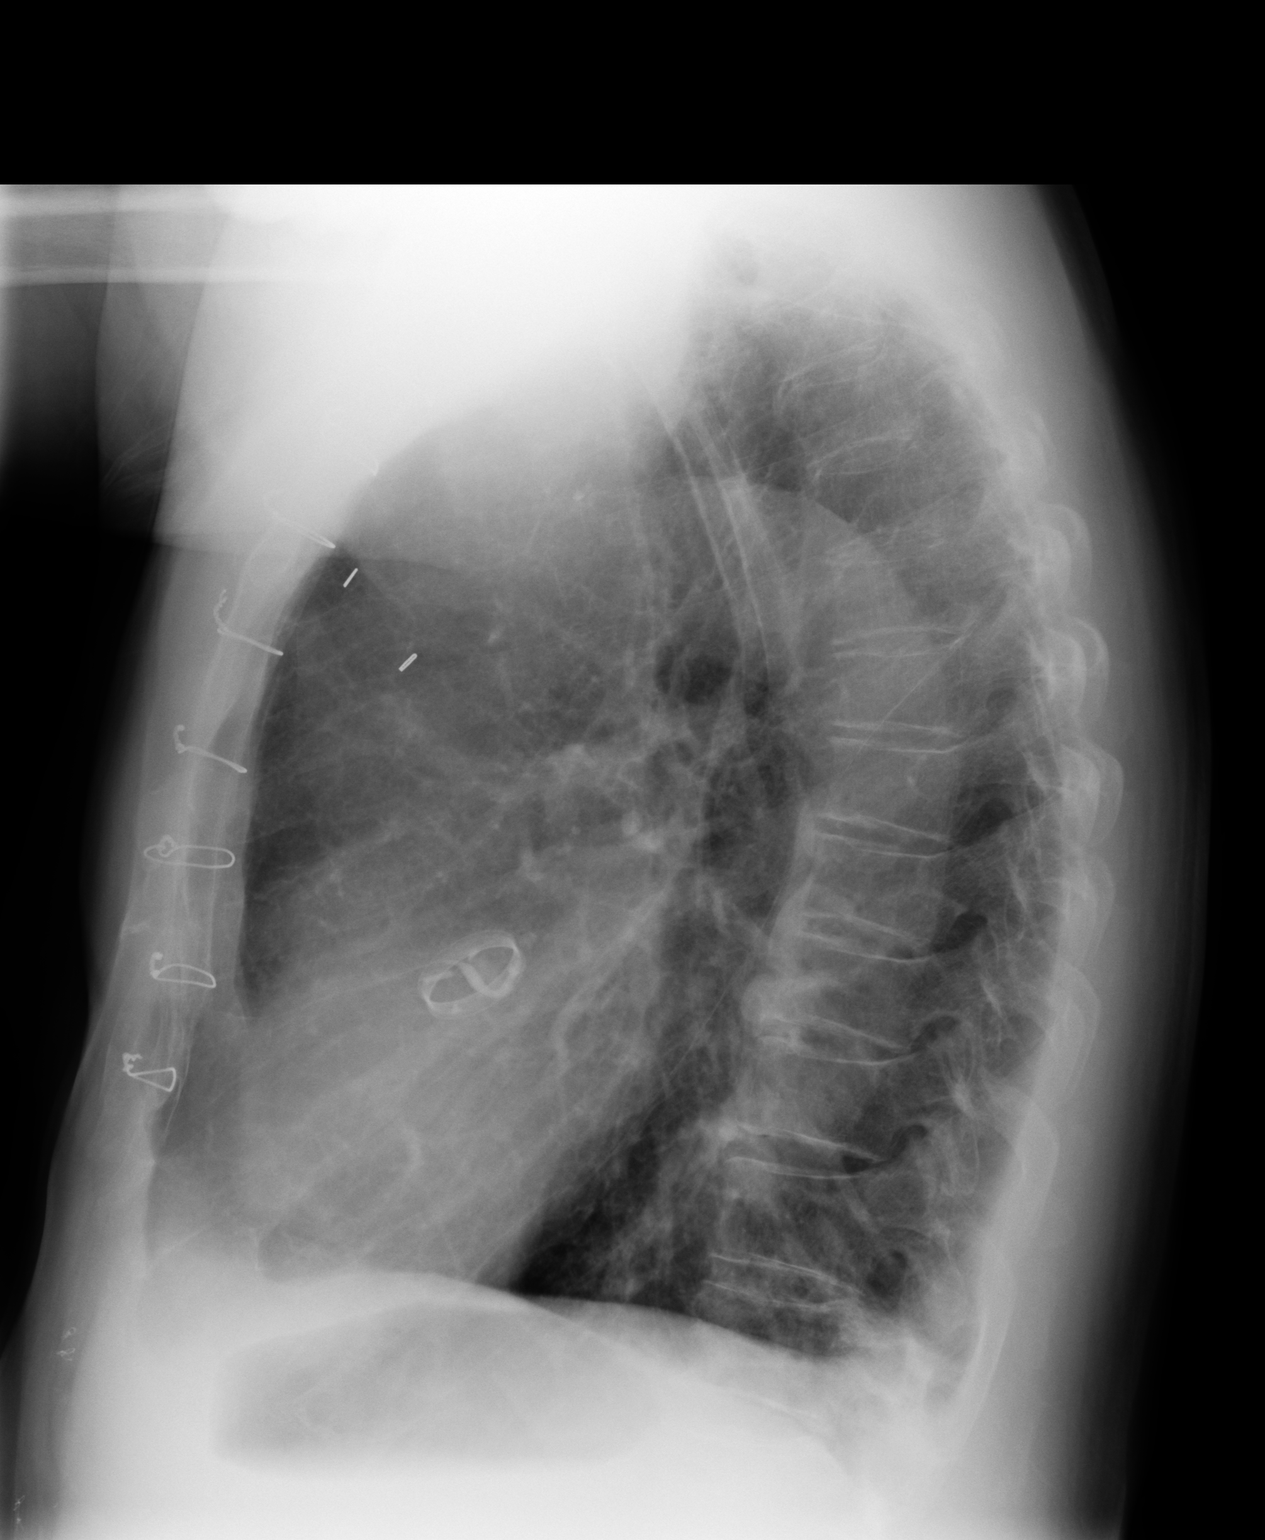
[im 3/3]
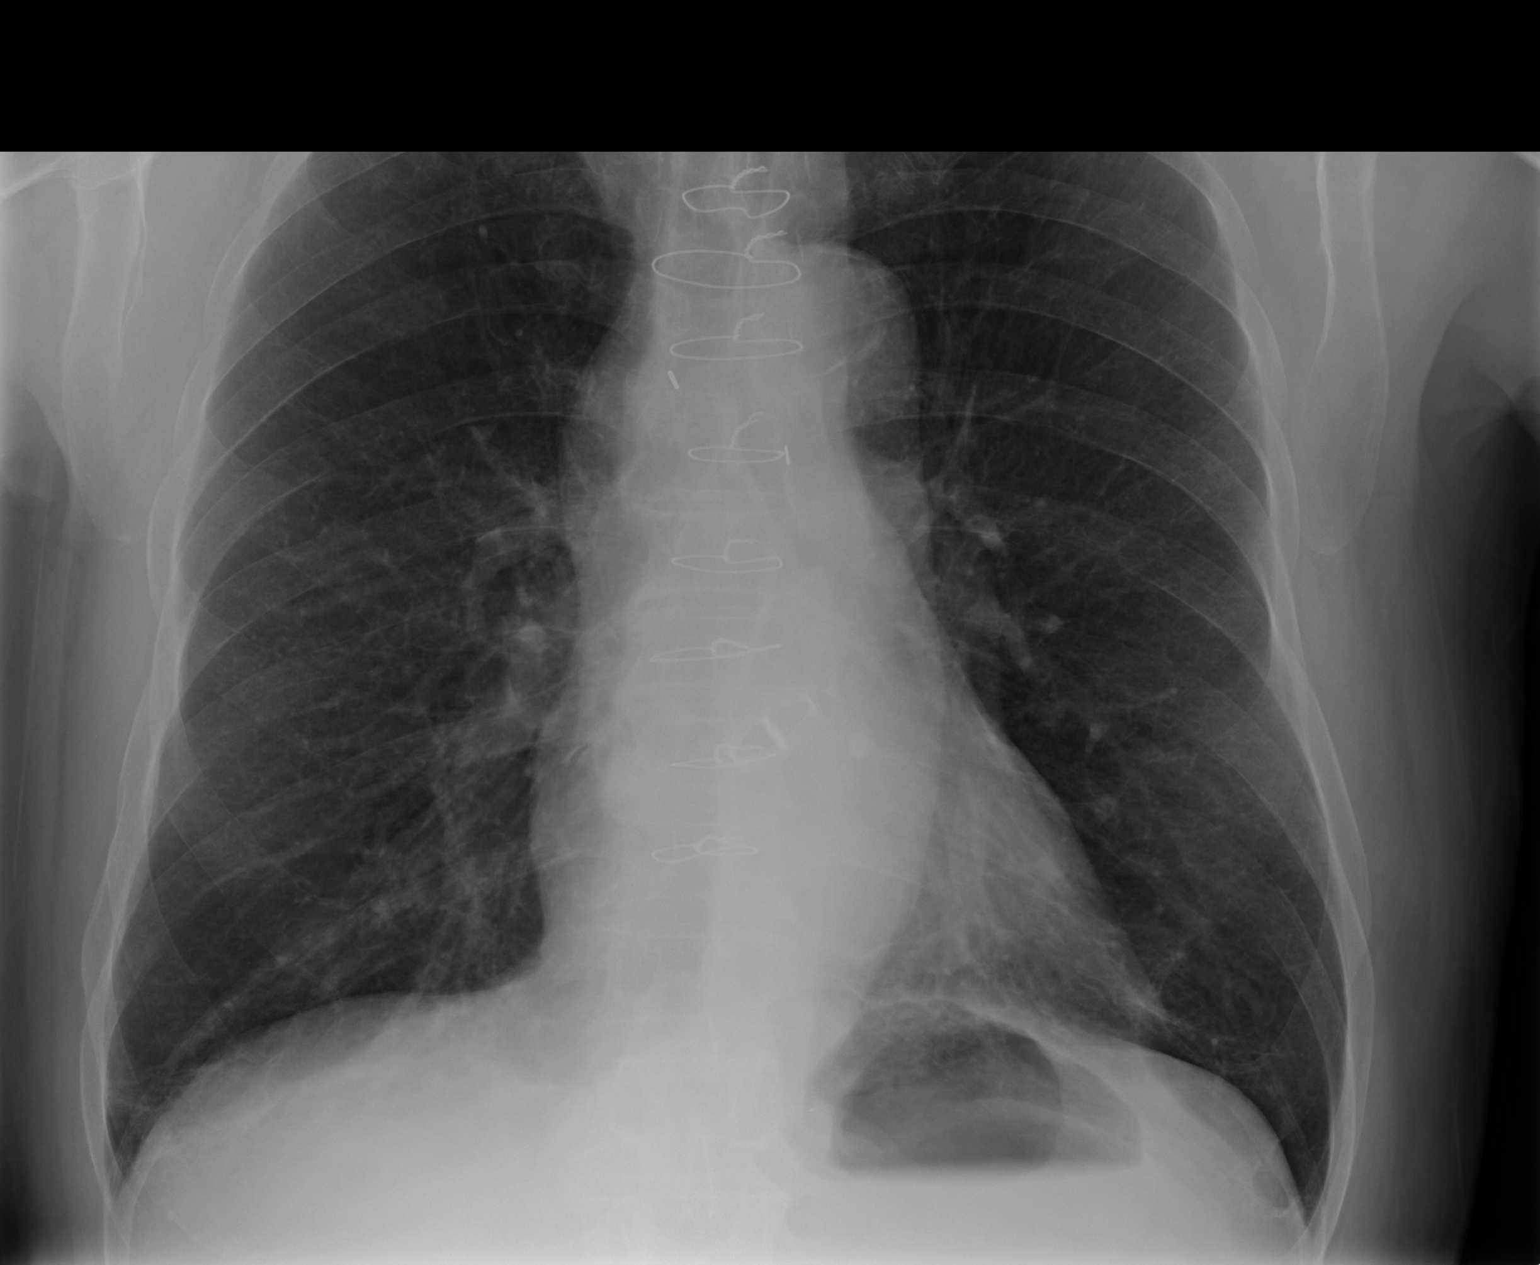

[3 of 3 positions shown; findings below may reference images not displayed]

FINDINGS: Heart is normal in size. Prior median sternotomy and valve replacement. The
aorta is mild to tortuous.  Mild prominence of the right superior
mediastinum likely secondary to the great vessels. There is small area of
opacification at the left posterior costophrenic angle seen only on the
lateral view.
IMPRESSION: Mild heterogeneous opacity in the left costophrenic angle may represent
early infection or atelectasis. Followup PA and lateral chest radiographs
are recommended to ensure resolution.

## 2012-07-10 ENCOUNTER — Ambulatory Visit (INDEPENDENT_AMBULATORY_CARE_PROVIDER_SITE_OTHER): Payer: Medicare PPO | Admitting: Cardiovascular Disease

## 2012-07-10 ENCOUNTER — Encounter: Payer: Self-pay | Admitting: Cardiovascular Disease

## 2012-07-10 VITALS — BP 142/90 | HR 50 | Ht 70.0 in | Wt 190.0 lb

## 2012-07-10 DIAGNOSIS — I714 Abdominal aortic aneurysm, without rupture, unspecified: Secondary | ICD-10-CM

## 2012-07-10 DIAGNOSIS — F17200 Nicotine dependence, unspecified, uncomplicated: Secondary | ICD-10-CM

## 2012-07-10 DIAGNOSIS — I4891 Unspecified atrial fibrillation: Secondary | ICD-10-CM

## 2012-07-10 DIAGNOSIS — Z952 Presence of prosthetic heart valve: Secondary | ICD-10-CM

## 2012-07-10 DIAGNOSIS — E785 Hyperlipidemia, unspecified: Secondary | ICD-10-CM

## 2012-07-10 DIAGNOSIS — Z954 Presence of other heart-valve replacement: Secondary | ICD-10-CM

## 2012-07-10 NOTE — Assessment & Plan Note (Signed)
Ideally LDL should be lower given that he continues to smoke and has PAD. He is not particularly eager to restart cholesterol medication.

## 2012-07-10 NOTE — Assessment & Plan Note (Signed)
On warfarin with therapeutic INR. Repeat echo 2015.

## 2012-07-10 NOTE — Patient Instructions (Addendum)
You are doing well. No medication changes were made.  Please call us if you have new issues that need to be addressed before your next appt.  Your physician wants you to follow-up in: 12 months.  You will receive a reminder letter in the mail two months in advance. If you don't receive a letter, please call our office to schedule the follow-up appointment. 
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
## 2012-07-10 NOTE — Assessment & Plan Note (Signed)
Maintaining normal sinus rhythm. No changes to his medications. 

## 2012-07-10 NOTE — Assessment & Plan Note (Signed)
We have encouraged him to continue to work on weaning his cigarettes and smoking cessation. He will continue to work on this and does not want any assistance with chantix.  

## 2012-07-10 NOTE — Assessment & Plan Note (Addendum)
endograft stent placed in 2013, followed by Dr. Durwin Nora,

## 2012-08-01 ENCOUNTER — Encounter: Payer: Self-pay | Admitting: Vascular Surgery

## 2012-08-02 ENCOUNTER — Ambulatory Visit (INDEPENDENT_AMBULATORY_CARE_PROVIDER_SITE_OTHER): Payer: Medicare PPO | Admitting: Vascular Surgery

## 2012-08-02 ENCOUNTER — Ambulatory Visit
Admission: RE | Admit: 2012-08-02 | Discharge: 2012-08-02 | Disposition: A | Discharge status: Home / Self Care | Payer: Medicare PPO | Source: Ambulatory Visit | Attending: Vascular Surgery | Admitting: Vascular Surgery

## 2012-08-02 ENCOUNTER — Other Ambulatory Visit: Payer: Self-pay | Admitting: *Deleted

## 2012-08-02 ENCOUNTER — Telehealth: Payer: Self-pay | Admitting: *Deleted

## 2012-08-02 ENCOUNTER — Other Ambulatory Visit: Payer: Self-pay | Admitting: Vascular Surgery

## 2012-08-02 ENCOUNTER — Encounter: Payer: Self-pay | Admitting: Vascular Surgery

## 2012-08-02 VITALS — BP 172/91 | HR 49 | Ht 70.0 in | Wt 189.0 lb

## 2012-08-02 DIAGNOSIS — I700 Atherosclerosis of aorta: Secondary | ICD-10-CM

## 2012-08-02 DIAGNOSIS — I712 Thoracic aortic aneurysm, without rupture, unspecified: Secondary | ICD-10-CM

## 2012-08-02 DIAGNOSIS — I714 Abdominal aortic aneurysm, without rupture, unspecified: Secondary | ICD-10-CM

## 2012-08-02 DIAGNOSIS — I7121 Aneurysm of the ascending aorta, without rupture: Secondary | ICD-10-CM

## 2012-08-02 DIAGNOSIS — Z48812 Encounter for surgical aftercare following surgery on the circulatory system: Secondary | ICD-10-CM

## 2012-08-02 DIAGNOSIS — I739 Peripheral vascular disease, unspecified: Secondary | ICD-10-CM | POA: Insufficient documentation

## 2012-08-02 LAB — CREATININE, SERUM: Creat: 1.2 mg/dL (ref 0.50–1.35)

## 2012-08-02 LAB — BUN: BUN: 19 mg/dL (ref 6–23)

## 2012-08-02 MED ORDER — IOHEXOL 350 MG/ML SOLN
100.0000 mL | Freq: Once | INTRAVENOUS | Status: AC | PRN
  Administered 2012-08-02: 100 mL via INTRAVENOUS

## 2012-08-02 NOTE — Telephone Encounter (Signed)
Please call Shaunte he wants to inform Dr. Mariah Milling of his medical care...just followed up with Dr.Dickson his vascular doctor.

## 2012-08-02 NOTE — Telephone Encounter (Signed)
See below

## 2012-08-02 NOTE — Progress Notes (Signed)
Vascular and Vein Specialist of [facility]  Patient name: Gregory Turner MRN: [id number] DOB: 10/25/1940 Sex: male  REASON FOR VISIT: follow up after EVAR  HPI: Gregory Turner is a 72 y.o. male I been following with an abdominal aortic aneurysm. This enlarged to 5.9 cm. He underwent percutaneous endovascular aneurysm repair on 06/22/2011. His one-month follow up study on 07/28/2011 showed that the stent graft was in excellent position with no evidence of endoleak. The size of the aneurysm had shrunk to 5.4 cm. He comes in for a yearly follow up visit. He denies any significant abdominal pain or back pain.   He had his CT scan today. Of note I have ordered a CT of the abdomen and pelvis however he also had a thoracic CT. Reviewing his most recent CT scan prior to this in January of this year it was noted that there was enlargement of the distal thoracic aorta and therefore I believe the CT of the chest was added for this study.  VASCULAR QUALITY INITIATIVE FOLLOW UP DATA:  Current smoker: Arly.Keller  ] yes  [  ] no  Living status: [ X ]  Home  [  ] Nursing home  [  ] Homeless    MEDS:  ASA [  ] yes  [  ] no- [ X ] medical reason  [  ] non compliant  [place]us  [  ] yes  [  ] no- [ X ] medical reason  [  ] non compliant  Beta blocker Arly.Keller  ] yes  [  ] no- [  ] medical reason  [  ] non compliant  ACE inhibitor [  ] yes  [  ] no- Arly.Keller ] medical reason  [  ] non compliant  P2Y12 Antagonist Arly.Keller  ] none  [  ] clopidogrel-Plavix  [  ] ticlopidine-Ticlid   [  ] prasugrel-Effient  [  ] ticagrelor- Brilinta    Anticoagulant [  ] None  Arly.Keller  ] warfarin  [  ] rivaroxaban-Xarelto [  ] dabigatran- Pradaxa  Current Max AAA = 4.9 mm  Endoleak:  Arly.Keller  ] no  [  ] yes- [  ] type I  [  ] type II  [  ] type III  [  ] indeterminate  Number of new interventions: 0 -   Date:      Why:  [  ] endoleak  [  ] limb occlusion   [  ] growth  [  ]  Migration   [  ] symtomatic/ rupture    Conversion to open repair: [  ] yes   Arly.Keller  ]  no   Date:   Other operation related to EVAR:  [  ] yes   Arly.Keller  ] no      Past Medical History  Diagnosis Date  . HTN (hypertension)   . COPD (chronic obstructive pulmonary disease)   . Bronchitis   . Afib   . H/O echocardiogram 04/2011  . 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 Shortness of breath     walks 1/2 mile every evening, /w some SOB  . Blood transfusion     many yrs. ago  . Arthritis     shoulders, back & hips   . Hernia, inguinal     will need surgery on this in the future   . AAA (abdominal aortic aneurysm)  Family History  Problem Relation Age of Onset  . Hypertension Mother   . Cancer Brother     lung  . Deep vein thrombosis Brother   . Other Brother     AAA  . Heart disease Father   . Heart attack Father   . Anesthesia problems Neg Hx     SOCIAL HISTORY: History  Substance Use Topics  . Smoking status: Current Every Day Smoker -- 1.00 packs/day for 53 years    Types: Cigarettes  . Smokeless tobacco: Never Used  . Alcohol Use: No    Allergies  Allergen Reactions  . Lisinopril     Cough   . Sulfa Drugs Cross Reactors Other (See Comments)    Makes patient feel faint    Current Outpatient Prescriptions  Medication Sig Dispense Refill  . acetaminophen (TYLENOL) 500 MG tablet Take 500 mg by mouth every 6 (six) hours as needed. For pain      . albuterol (PROVENTIL HFA;VENTOLIN HFA) 108 (90 BASE) MCG/ACT inhaler Inhale 2 puffs into the lungs every 6 (six) hours as needed. For shortness of breath      . amiodarone (PACERONE) 200 MG tablet Take 200 mg by mouth daily.      . diflorasone (PSORCON) 0.05 % cream Apply 1 application topically daily as needed. For eczema      . HYDROcodone-acetaminophen (NORCO/VICODIN) 5-325 MG per tablet Take 1 tablet by mouth every 6 (six) hours as needed.       Marland Kitchen losartan (COZAAR) 100 MG tablet Take 100 mg by mouth at bedtime.       . metoprolol tartrate (LOPRESSOR) 12.5 mg  TABS Take 12.5 mg by mouth 2 (two) times daily.      Marland Kitchen oxyCODONE (OXY IR/ROXICODONE) 5 MG immediate release tablet Take 5 mg by mouth every 6 (six) hours as needed.       . warfarin (COUMADIN) 6 MG tablet Take 6 mg by mouth daily. As directed.       No current facility-administered medications for this visit.    REVIEW OF SYSTEMS: Arly.Keller ] denotes positive finding; [  ] denotes negative finding  CARDIOVASCULAR:  [ ]  chest pain   [ ]  chest pressure   [ ]  palpitations   [ ]  orthopnea   [ ]  dyspnea on exertion   [ ]  claudication   [ ]  rest pain   [ ]  DVT   [ ]  phlebitis PULMONARY:   [ ]  productive cough   [ ]  asthma   [ ]  wheezing NEUROLOGIC:   [ ]  weakness  [ ]  paresthesias  [ ]  aphasia  [ ]  amaurosis  [ ]  dizziness HEMATOLOGIC:   [ ]  bleeding problems   [ ]  clotting disorders MUSCULOSKELETAL:  [ ]  joint pain   [ ]  joint swelling [ ]  leg swelling GASTROINTESTINAL: [ ]   blood in stool  [ ]   hematemesis GENITOURINARY:  [ ]   dysuria  [ ]   hematuria PSYCHIATRIC:  [ ]  history of major depression INTEGUMENTARY:  [ ]  rashes  [ ]  ulcers CONSTITUTIONAL:  [ ]  fever   [ ]  chills  PHYSICAL EXAM: Filed Vitals:   08/02/12 1128  BP: 172/91  Pulse: 49  Height: 5\' 10"  (1.778 m)  Weight: 189 lb (85.73 kg)  SpO2: 97%   Body mass index is 27.12 kg/(m^2). GENERAL: The patient is a well-nourished male, in no acute distress. The vital signs are documented above. CARDIOVASCULAR: There is a regular rate and rhythm.  Do not detect carotid bruits. He has palpable femoral pulses. PULMONARY: There is good air exchange bilaterally without wheezing or rales. ABDOMEN: Soft and non-tender with normal pitched bowel sounds.  MUSCULOSKELETAL: There are no major deformities or cyanosis. NEUROLOGIC: No focal weakness or paresthesias are detected. SKIN: There are no ulcers or rashes noted. PSYCHIATRIC: The patient has a normal affect.  DATA:  CT OF THE ABDOMEN PELVIS AND CHEST: he has aneurysmal dilatation of the  ascending thoracic aorta measuring 5.4 cm in greatest diameter. There is no evidence of aortic dissection. The stent graft appears to be in good position with no further evidence of distal migration as his previous study in January had shown some slight caudal migration of the graft. Aneurysm measured 4.9 cm in maximum diameter and thus has continued to shrink. There is no evidence of endoleak.  MEDICAL ISSUES: 1. Status post EVAR. Aneurysm has continued to decrease in size and is decreased from 5.9 cm to 4.9 cm. There is no evidence of endoleak. I plan on seeing him back in one year and I have ordered an ultrasound of his graft at that time.  2. CT of the chest shows evidence of an aneurysm of the ascending thoracic aorta. Although he had his aortic valve replacement done elsewhere he would prefer to be seen in [place] and therefore we will arrange for him to get a consult at [facility].   3. He continues to smoke and we have consult him again on the importance of tobacco cessation.  4. He is currently not on a statin. I've explained that the vascular literature suggests significantly lower mortality in patients with peripheral vascular disease if they are on a statin. I've asked him to address this with his primary care physician  Makhia Vosler S Vascular and Vein Specialists of [facility] Beeper: [phone]

## 2012-08-02 NOTE — Telephone Encounter (Signed)
Called spoke with pt.  Pt wanted to notify Dr Mariah Milling pt saw Dr Edilia Bo vascular MD in f/u and per CTA chest/abd/pelvis done 08/02/12 pt was found to have new aneurysm around aorta in heart.  Dr Edilia Bo wants to refer pt to surgeon in [place]. Pt wants to make Dr Mariah Milling aware of findings and wants him to review results and advise of his recommendations.

## 2012-08-02 NOTE — Telephone Encounter (Signed)
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 This is large and needs to be evaluated by a cardiothoracic surgeon.  The surgeon in [place] made look at this and be able to repair. If too extensive, he may be referred to [facility]. Sometimes they will watch it very short period of time but it is getting larger and may need to be addressed at some point. If repair, it would be a major surgery. Unable to put a stent in that location.

## 2012-08-03 NOTE — Telephone Encounter (Signed)
Advised pt of Dr Windell Hummingbird recommendations.  Pt verbalized understanding.

## 2012-08-03 NOTE — Telephone Encounter (Signed)
Attempted TCB LM with wife TCB.

## 2012-08-07 ENCOUNTER — Encounter: Payer: Medicare PPO | Admitting: Cardiothoracic Surgery

## 2012-08-08 ENCOUNTER — Institutional Professional Consult (permissible substitution) (INDEPENDENT_AMBULATORY_CARE_PROVIDER_SITE_OTHER): Payer: Medicare PPO | Admitting: Cardiothoracic Surgery

## 2012-08-08 ENCOUNTER — Encounter: Payer: Self-pay | Admitting: Cardiothoracic Surgery

## 2012-08-08 ENCOUNTER — Other Ambulatory Visit: Payer: Self-pay | Admitting: *Deleted

## 2012-08-08 VITALS — BP 158/80 | HR 16 | Ht 70.0 in | Wt 190.0 lb

## 2012-08-08 DIAGNOSIS — I712 Thoracic aortic aneurysm, without rupture, unspecified: Secondary | ICD-10-CM

## 2012-08-08 DIAGNOSIS — I7121 Aneurysm of the ascending aorta, without rupture: Secondary | ICD-10-CM

## 2012-08-08 DIAGNOSIS — I714 Abdominal aortic aneurysm, without rupture, unspecified: Secondary | ICD-10-CM
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
## 2012-08-08 NOTE — Patient Instructions (Signed)
Stop smoking Get your teeth cleaned twice a year Finish physical rehabilitation for your right leg fracture

## 2012-08-08 NOTE — Addendum Note (Signed)
Addended by: Sharee Pimple on: 08/08/2012 09:25 AM   Modules accepted: Orders

## 2012-08-10 ENCOUNTER — Encounter: Payer: Self-pay | Admitting: *Deleted

## 2012-08-15 ENCOUNTER — Ambulatory Visit (HOSPITAL_COMMUNITY)
Admission: RE | Admit: 2012-08-15 | Discharge: 2012-08-15 | Disposition: A | Discharge status: Home / Self Care | Payer: Medicare PPO | Source: Ambulatory Visit | Attending: Cardiothoracic Surgery | Admitting: Cardiothoracic Surgery

## 2012-08-15 DIAGNOSIS — J988 Other specified respiratory disorders: Secondary | ICD-10-CM | POA: Insufficient documentation

## 2012-08-15 DIAGNOSIS — I714 Abdominal aortic aneurysm, without rupture, unspecified: Secondary | ICD-10-CM | POA: Insufficient documentation

## 2012-08-15 DIAGNOSIS — R059 Cough, unspecified: Secondary | ICD-10-CM | POA: Insufficient documentation

## 2012-08-15 DIAGNOSIS — R0609 Other forms of dyspnea: Secondary | ICD-10-CM | POA: Insufficient documentation

## 2012-08-15 DIAGNOSIS — R0989 Other specified symptoms and signs involving the circulatory and respiratory systems: Secondary | ICD-10-CM | POA: Insufficient documentation

## 2012-08-15 DIAGNOSIS — R062 Wheezing: Secondary | ICD-10-CM | POA: Insufficient documentation

## 2012-08-15 DIAGNOSIS — R05 Cough: Secondary | ICD-10-CM | POA: Insufficient documentation

## 2012-08-15 LAB — PULMONARY FUNCTION TEST

## 2012-08-15 MED ORDER — ALBUTEROL SULFATE (5 MG/ML) 0.5% IN NEBU
2.5000 mg | INHALATION_SOLUTION | Freq: Once | RESPIRATORY_TRACT | Status: AC
  Administered 2012-08-15: 2.5 mg via RESPIRATORY_TRACT

## 2012-11-06 ENCOUNTER — Other Ambulatory Visit: Payer: Self-pay | Admitting: *Deleted

## 2012-11-06 DIAGNOSIS — I714 Abdominal aortic aneurysm, without rupture, unspecified: Secondary | ICD-10-CM

## 2012-11-06 DIAGNOSIS — I2510 Atherosclerotic heart disease of native coronary artery without angina pectoris: Secondary | ICD-10-CM

## 2012-11-08 ENCOUNTER — Ambulatory Visit
Admission: RE | Admit: 2012-11-08 | Discharge: 2012-11-08 | Disposition: A | Discharge status: Home / Self Care | Payer: Medicare PPO | Source: Ambulatory Visit | Attending: Cardiothoracic Surgery | Admitting: Cardiothoracic Surgery

## 2012-11-08 ENCOUNTER — Encounter: Payer: Self-pay | Admitting: Cardiothoracic Surgery

## 2012-11-08 ENCOUNTER — Ambulatory Visit (INDEPENDENT_AMBULATORY_CARE_PROVIDER_SITE_OTHER): Payer: Medicare PPO | Admitting: Cardiothoracic Surgery

## 2012-11-08 VITALS — BP 157/93 | HR 48 | Resp 20 | Ht 70.0 in | Wt 194.0 lb

## 2012-11-08 DIAGNOSIS — I712 Thoracic aortic aneurysm, without rupture, unspecified: Secondary | ICD-10-CM

## 2012-11-08 DIAGNOSIS — I714 Abdominal aortic aneurysm, without rupture, unspecified: Secondary | ICD-10-CM

## 2012-11-08 DIAGNOSIS — I2510 Atherosclerotic heart disease of native coronary artery without angina pectoris: Secondary | ICD-10-CM

## 2012-11-08 DIAGNOSIS — I7121 Aneurysm of the ascending aorta, without rupture: Secondary | ICD-10-CM
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
## 2012-11-09 ENCOUNTER — Telehealth: Payer: Self-pay

## 2012-11-09 DIAGNOSIS — I714 Abdominal aortic aneurysm, without rupture, unspecified: Secondary | ICD-10-CM

## 2012-11-09 DIAGNOSIS — R079 Chest pain, unspecified: Secondary | ICD-10-CM

## 2012-11-09 DIAGNOSIS — I4891 Unspecified atrial fibrillation: Secondary | ICD-10-CM

## 2012-11-09 NOTE — Telephone Encounter (Signed)
Spoke w/ pt's wife.  Gave her instructions for cardiac cath 11/17/12.   She verbalizes understanding.  Pt to come into office tomorrow for preprocedure labs.

## 2012-11-10 ENCOUNTER — Other Ambulatory Visit: Payer: Medicare PPO

## 2012-11-10 ENCOUNTER — Other Ambulatory Visit: Payer: Self-pay | Admitting: *Deleted

## 2012-11-10 DIAGNOSIS — I714 Abdominal aortic aneurysm, without rupture, unspecified: Secondary | ICD-10-CM

## 2012-11-10 DIAGNOSIS — R0789 Other chest pain: Secondary | ICD-10-CM

## 2012-11-10 DIAGNOSIS — R079 Chest pain, unspecified: Secondary | ICD-10-CM

## 2012-11-10 DIAGNOSIS — I4891 Unspecified atrial fibrillation: Secondary | ICD-10-CM

## 2012-11-10 DIAGNOSIS — J449 Chronic obstructive pulmonary disease, unspecified: Secondary | ICD-10-CM

## 2012-11-11 LAB — BASIC METABOLIC PANEL
CO2: 22 mmol/L (ref 18–29)
Calcium: 9 mg/dL (ref 8.6–10.2)
Chloride: 99 mmol/L (ref 97–108)
Glucose: 74 mg/dL (ref 65–99)
Potassium: 5 mmol/L (ref 3.5–5.2)
Sodium: 144 mmol/L (ref 134–144)

## 2012-11-11 LAB — CBC WITH DIFFERENTIAL/PLATELET
Basophils Absolute: 0.1 10*3/uL (ref 0.0–0.2)
Basos: 1 %
Eosinophils Absolute: 0.1 10*3/uL (ref 0.0–0.4)
Immature Grans (Abs): 0.2 10*3/uL — ABNORMAL HIGH (ref 0.0–0.1)
Immature Granulocytes: 4 %
Lymphs: 27 %
MCH: 32.3 pg (ref 26.6–33.0)
MCV: 92 fL (ref 79–97)
Monocytes Absolute: 0.7 10*3/uL (ref 0.1–0.9)
Neutrophils Relative %: 54 %
RBC: 4.65 x10E6/uL (ref 4.14–5.80)
RDW: 13.9 % (ref 12.3–15.4)

## 2012-11-11 LAB — PROTIME-INR: INR: 1.9 — ABNORMAL HIGH (ref 0.8–1.2)

## 2012-11-13 ENCOUNTER — Telehealth: Payer: Self-pay

## 2012-11-13 NOTE — Telephone Encounter (Signed)
Pt would like INR results

## 2012-11-14 NOTE — Telephone Encounter (Signed)
Spoke w/ pt. He is aware of results and will inform Dr. Arlana Pouch, who monitors his warfarin.

## 2012-11-17 ENCOUNTER — Telehealth: Payer: Self-pay | Admitting: Cardiovascular Disease

## 2012-11-17 ENCOUNTER — Other Ambulatory Visit: Payer: Self-pay | Admitting: Cardiovascular Disease

## 2012-11-17 MED ORDER — CEPHALEXIN 500 MG PO CAPS: 500.0000 mg | ORAL_CAPSULE | Freq: Four times a day (QID) | ORAL | Status: DC

## 2012-11-17 MED ORDER — ENOXAPARIN SODIUM 100 MG/ML ~~LOC~~ SOLN: 90.0000 mg | Freq: Two times a day (BID) | SUBCUTANEOUS | Status: DC

## 2012-11-17 NOTE — Telephone Encounter (Signed)
Gregory Turner presented today to [facility] for cardiac cath. He has cellulitis of his right hand, extending up his forearm (cut on hand working in the garden). Due to miscommunication, he was not bridged from warfarin to lovenox for cath. INR is 2.0. Cardiac cath was cancelled and reschedule for Tuesday 11/4. Echo scheduled for 11/4 in  at his request. He will stop warfarin today and start lovenox tonight.

## 2012-11-20 ENCOUNTER — Encounter: Payer: Self-pay | Admitting: Cardiovascular Disease

## 2012-11-21 ENCOUNTER — Ambulatory Visit (HOSPITAL_COMMUNITY): Payer: Medicare PPO

## 2012-11-21 ENCOUNTER — Ambulatory Visit: Payer: Self-pay | Admitting: Cardiovascular Disease

## 2012-11-21 ENCOUNTER — Ambulatory Visit (INDEPENDENT_AMBULATORY_CARE_PROVIDER_SITE_OTHER): Payer: Medicare PPO | Admitting: Cardiology

## 2012-11-21 ENCOUNTER — Encounter: Payer: Self-pay | Admitting: Cardiovascular Disease

## 2012-11-21 DIAGNOSIS — R079 Chest pain, unspecified: Secondary | ICD-10-CM

## 2012-11-21 DIAGNOSIS — I4891 Unspecified atrial fibrillation: Secondary | ICD-10-CM

## 2012-11-21 LAB — PROTIME-INR: Prothrombin Time: 14.2 secs (ref 11.5–14.7)

## 2012-11-22 ENCOUNTER — Encounter: Payer: Self-pay | Admitting: Cardiothoracic Surgery

## 2012-11-22 ENCOUNTER — Encounter: Payer: Self-pay | Admitting: Cardiovascular Disease

## 2012-11-22 ENCOUNTER — Ambulatory Visit (INDEPENDENT_AMBULATORY_CARE_PROVIDER_SITE_OTHER): Payer: Medicare PPO | Admitting: Cardiothoracic Surgery

## 2012-11-22 VITALS — BP 155/85 | HR 53 | Resp 16 | Ht 70.0 in | Wt 194.0 lb

## 2012-11-22 DIAGNOSIS — I712 Thoracic aortic aneurysm, without rupture, unspecified: Secondary | ICD-10-CM

## 2012-11-22 DIAGNOSIS — I7121 Aneurysm of the ascending aorta, without rupture: Secondary | ICD-10-CM

## 2012-11-22 DIAGNOSIS — Z952 Presence of prosthetic heart valve: Secondary | ICD-10-CM

## 2012-11-22 DIAGNOSIS — J449 Chronic obstructive pulmonary disease, unspecified: Secondary | ICD-10-CM

## 2012-11-22 DIAGNOSIS — Z95828 Presence of other vascular implants and grafts: Secondary | ICD-10-CM

## 2012-11-22 DIAGNOSIS — Z954 Presence of other heart-valve replacement: Secondary | ICD-10-CM

## 2012-11-22 LAB — LAB REPORT - SCANNED: INR: 1.1
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
## 2012-11-23 ENCOUNTER — Telehealth: Payer: Self-pay

## 2012-11-23 ENCOUNTER — Other Ambulatory Visit: Payer: Self-pay | Admitting: *Deleted

## 2012-11-23 DIAGNOSIS — J449 Chronic obstructive pulmonary disease, unspecified: Secondary | ICD-10-CM

## 2012-11-23 MED ORDER — BUDESONIDE-FORMOTEROL FUMARATE 80-4.5 MCG/ACT IN AERO: 2.0000 | INHALATION_SPRAY | Freq: Two times a day (BID) | RESPIRATORY_TRACT | Status: DC

## 2012-11-23 NOTE — Telephone Encounter (Signed)
Message copied by Marilynne Halsted on Thu Nov 23, 2012 12:18 PM ------      Message from: Antonieta Iba      Created: Wed Nov 22, 2012 11:08 PM       inr seen from 11/5      He will need to stay on lovenox through the weekend, check inr on Monday            When is surgery scheduled in [place]? ------

## 2012-11-23 NOTE — Telephone Encounter (Signed)
Spoke w/ pt's wife. She verbalizes understanding.  Resched INR check to Monday at 10:00. Surgery sched for 12/11/12, but reports that he will be admitted 11/21 to be started on heparin.

## 2012-11-28 ENCOUNTER — Other Ambulatory Visit: Payer: Self-pay

## 2012-11-28 ENCOUNTER — Telehealth: Payer: Self-pay

## 2012-11-28 ENCOUNTER — Encounter: Payer: Self-pay | Admitting: Cardiovascular Disease

## 2012-11-28 ENCOUNTER — Ambulatory Visit (INDEPENDENT_AMBULATORY_CARE_PROVIDER_SITE_OTHER): Payer: Medicare PPO | Admitting: Cardiovascular Disease

## 2012-11-28 VITALS — BP 140/78 | HR 55 | Ht 70.0 in | Wt 194.5 lb

## 2012-11-28 DIAGNOSIS — I714 Abdominal aortic aneurysm, without rupture, unspecified: Secondary | ICD-10-CM

## 2012-11-28 DIAGNOSIS — I7121 Aneurysm of the ascending aorta, without rupture: Secondary | ICD-10-CM

## 2012-11-28 DIAGNOSIS — Z952 Presence of prosthetic heart valve: Secondary | ICD-10-CM

## 2012-11-28 DIAGNOSIS — I712 Thoracic aortic aneurysm, without rupture, unspecified: Secondary | ICD-10-CM

## 2012-11-28 DIAGNOSIS — F17200 Nicotine dependence, unspecified, uncomplicated: Secondary | ICD-10-CM

## 2012-11-28 DIAGNOSIS — I4891 Unspecified atrial fibrillation: Secondary | ICD-10-CM

## 2012-11-28 DIAGNOSIS — Z954 Presence of other heart-valve replacement: Secondary | ICD-10-CM

## 2012-11-28 DIAGNOSIS — E785 Hyperlipidemia, unspecified: Secondary | ICD-10-CM

## 2012-11-28 MED ORDER — ENOXAPARIN SODIUM 100 MG/ML ~~LOC~~ SOLN: 90.0000 mg | Freq: Two times a day (BID) | SUBCUTANEOUS | Status: DC

## 2012-11-28 NOTE — Telephone Encounter (Signed)
Spoke with Curlene Dolphin. with [employer] insurance for prior authorization for Enoxaparin 100 mg/1ML inject 0.9 ML (1 syringe) into the skin every 12 hours. Gave detailed information that the patient's INR today is 1.1 and has a mechanical aortic valve and needs the Enoxaparin authorized now so that the patient would be covered since he is at risk for stroke. Gabriel Rung states it will take 24 hours before the authorization is approved but could call back in 3-4 hours to see if approved. Gabriel Rung states if approved, the patient can go ahead and get the Enoxaparin and will be reimbursed for the doses. The pharmacist at [facility] will discuss with the patient and see if they can give them enough to get him covered for tonight and tomorrow am for the Enoxaparin.

## 2012-11-28 NOTE — Progress Notes (Addendum)
Patient ID: Gregory Turner, male    DOB: October 14, 1940, 72 y.o.   MRN: [id number]  HPI Comments: Mr. [name] is a very pleasant 72 year old gentleman with a history of aortic valve replacement in May 1985 previously followed at [facility], long history of smoking who continues to smoke one pack per day, hypertension,  noted to be in atrial fibrillation in early March 2013 when evaluated by Dr. Dewaine Oats. Amiodarone was started on his previous clinic visit for pharmacologic cardioversion. He did not have any significant symptoms and was taking warfarin for his prosthetic mechanical valve.  infrarenal abdominal aortic aneurysm was found estimated at 5.9 cm, aortic endograft stenting done in [facility] by Dr. Edilia Bo in 2013   Now seen in clinic for postoperative cath followup , planning for severely dilated ascending aorta surgery He has been seen by Dr. Donata Clay in [place] for surgical repair of this ascending aorta aneurysm. Catheterization last week 11/21/2012 showing right dominant coronary system with mild diffuse atherosclerosis, 30% LAD disease, normal right heart pressures  Previous echocardiogram showed normal LV function, mildly dilated left atrium, normal functioning aortic valve mechanical valve.  He has a strong family history of aneurysm, with a brother who had AAA rupture, mother with AAA. Most of the family smokes.  EKG today shows normal sinus rhythm with rate 55 beats per minute, no significant ST or T wave changes Laboratory January 2014 shows total cholesterol 160, LDL 91, HDL 37, creatinine 1.29 He stopped taking Lipitor on his own in the past   Outpatient Encounter Prescriptions as of 11/28/2012  Medication Sig  . acetaminophen (TYLENOL) 500 MG tablet Take 500 mg by mouth every 6 (six) hours as needed. For pain  . albuterol (PROVENTIL HFA;VENTOLIN HFA) 108 (90 BASE) MCG/ACT inhaler Inhale 2 puffs into the lungs every 6 (six) hours as needed. For shortness of  breath  . amiodarone (PACERONE) 200 MG tablet Take 200 mg by mouth daily.  . budesonide-formoterol (SYMBICORT) 80-4.5 MCG/ACT inhaler Inhale 2 puffs into the lungs 2 (two) times daily.  . diflorasone (PSORCON) 0.05 % cream Apply 1 application topically daily as needed. For eczema  . losartan (COZAAR) 100 MG tablet Take 100 mg by mouth at bedtime.   . metoprolol tartrate (LOPRESSOR) 12.5 mg TABS Take 12.5 mg by mouth 2 (two) times daily.  Marland Kitchen warfarin (COUMADIN) 6 MG tablet Take 6 mg by mouth daily. As directed.  . enoxaparin (LOVENOX) 100 MG/ML injection Inject 0.9 mLs (90 mg total) into the skin every 12 (twelve) hours.  . [DISCONTINUED] cephALEXin (KEFLEX) 500 MG capsule Take 1 capsule (500 mg total) by mouth 4 (four) times daily.  . [DISCONTINUED] enoxaparin (LOVENOX) 100 MG/ML injection Inject 0.9 mLs (90 mg total) into the skin every 12 (twelve) hours.     Review of Systems  Constitutional: Negative.   HENT: Negative.   Eyes: Negative.   Respiratory: Negative.   Cardiovascular: Negative.   Gastrointestinal: Negative.   Endocrine: Negative.   Musculoskeletal: Negative.   Skin: Negative.   Allergic/Immunologic: Negative.   Neurological: Negative.   Hematological: Negative.   Psychiatric/Behavioral: Negative.   All other systems reviewed and are negative.    BP 140/78  Pulse 55  Ht 5\' 10"  (1.778 m)  Wt 194 lb 8 oz (88.225 kg)  BMI 27.91 kg/m2  Physical Exam  Nursing note and vitals reviewed. Constitutional: He is oriented to person, place, and time. He appears well-developed and well-nourished.  HENT:  Head: Normocephalic.  Nose:  Nose normal.  Mouth/Throat: Oropharynx is clear and moist.  Eyes: Conjunctivae are normal. Pupils are equal, round, and reactive to light.  Neck: Normal range of motion. Neck supple. No JVD present.  Cardiovascular: Normal rate, regular rhythm, S1 normal, S2 normal and intact distal pulses.  Exam reveals no gallop and no friction rub.   Murmur  heard.  Crescendo systolic murmur is present with a grade of 2/6  Audible click from his prosthetic valve  Pulmonary/Chest: Effort normal and breath sounds normal. No respiratory distress. He has no wheezes. He has no rales. He exhibits no tenderness.  Abdominal: Soft. Bowel sounds are normal. He exhibits no distension. There is no tenderness.  Musculoskeletal: Normal range of motion. He exhibits no edema and no tenderness.  Lymphadenopathy:    He has no cervical adenopathy.  Neurological: He is alert and oriented to person, place, and time. Coordination normal.  Skin: Skin is warm and dry. No rash noted. No erythema.  Psychiatric: He has a normal mood and affect. His behavior is normal. Judgment and thought content normal.      Assessment and Plan

## 2012-11-28 NOTE — Assessment & Plan Note (Signed)
He took himself off a statin in the past.

## 2012-11-28 NOTE — Assessment & Plan Note (Signed)
Severely dilated. Scheduled for surgery November 24. We did spend a long time discussing the surgery, potential need to replace his valve with bioprosthetic valve. He would be excited to no longer require warfarin and is increasingly eager to have the valve changed.

## 2012-11-28 NOTE — Assessment & Plan Note (Signed)
We have encouraged him to continue to work on weaning his cigarettes and smoking cessation. He will continue to work on this and does not want any assistance with chantix.  

## 2012-11-28 NOTE — Assessment & Plan Note (Signed)
Endograft stent placed in [place].

## 2012-11-28 NOTE — Assessment & Plan Note (Signed)
Maintaining normal sinus rhythm. We'll be high risk of atrial fibrillation during the surgery.

## 2012-11-28 NOTE — Assessment & Plan Note (Signed)
INR today is 1.1. He recently finished a course of Keflex for cellulitis on his right hand extending up his forearm. This was started approximately 10 days ago, last dose today. We will continue him on Lovenox twice a day with recheck of INR in one week. We have suggested he continue on his Coumadin as he has been doing

## 2012-11-28 NOTE — Patient Instructions (Signed)
You are doing well.  Please restart lovenox 90 mg twice a day shot Please come in for INR check on Monday  Please call us if you have new issues that need to be addressed before your next appt.  Your physician wants you to follow-up in: 3 month.

## 2012-11-29 NOTE — Telephone Encounter (Signed)
Notified patient and pharmacist that the Enoxaparin 100mg /1ML synringes 20/10 has been approved until 11/28/2013 per [employer].

## 2012-11-29 NOTE — Telephone Encounter (Signed)
The patient started his first dose of Enoxaprin yesterday Nov. 11, 2014.

## 2012-12-01 ENCOUNTER — Encounter (HOSPITAL_COMMUNITY): Payer: Self-pay | Admitting: Pharmacy Technician

## 2012-12-04 ENCOUNTER — Telehealth: Payer: Self-pay

## 2012-12-04 ENCOUNTER — Ambulatory Visit (INDEPENDENT_AMBULATORY_CARE_PROVIDER_SITE_OTHER): Payer: Medicare PPO | Admitting: Cardiovascular Disease

## 2012-12-04 NOTE — Progress Notes (Addendum)
Patient came in office today for INR check. The patient's INR was 1.1 on 11/28/2012 at Dr. Windell Hummingbird office visit. He comes to the office today for an INR check today per Dr. Windell Hummingbird request. The INR today is 1.4 after being on Lovenox for 12 doses with warfarin at 6 mg alt with 3 mg.   The patient was instructed to stop Warfarin today, continue with Lovenox of 8 more doses with his last dose of Lovenox in the pm on December 07, 2012. He will not have Lovenox on December 08, 2012. He will be admitted for surgery on December 08, 2012.

## 2012-12-04 NOTE — Telephone Encounter (Signed)
Error

## 2012-12-08 ENCOUNTER — Inpatient Hospital Stay (HOSPITAL_COMMUNITY): Admission: RE | Admit: 2012-12-08 | Payer: Medicare PPO | Source: Ambulatory Visit | Admitting: Cardiothoracic Surgery

## 2012-12-08 ENCOUNTER — Inpatient Hospital Stay (HOSPITAL_COMMUNITY)
Admission: RE | Admit: 2012-12-08 | Discharge: 2012-12-26 | DRG: 219 | Disposition: A | Discharge status: Home Health | Payer: Medicare PPO | Source: Ambulatory Visit | Attending: Cardiothoracic Surgery | Admitting: Cardiothoracic Surgery

## 2012-12-08 ENCOUNTER — Other Ambulatory Visit: Payer: Self-pay | Admitting: *Deleted

## 2012-12-08 ENCOUNTER — Inpatient Hospital Stay (HOSPITAL_COMMUNITY): Payer: Medicare PPO

## 2012-12-08 ENCOUNTER — Encounter (HOSPITAL_COMMUNITY): Payer: Self-pay | Admitting: Cardiology

## 2012-12-08 DIAGNOSIS — I712 Thoracic aortic aneurysm, without rupture, unspecified: Secondary | ICD-10-CM

## 2012-12-08 DIAGNOSIS — M129 Arthropathy, unspecified: Secondary | ICD-10-CM | POA: Diagnosis present

## 2012-12-08 DIAGNOSIS — Z954 Presence of other heart-valve replacement: Secondary | ICD-10-CM

## 2012-12-08 DIAGNOSIS — I7121 Aneurysm of the ascending aorta, without rupture: Secondary | ICD-10-CM | POA: Diagnosis present

## 2012-12-08 DIAGNOSIS — Z888 Allergy status to other drugs, medicaments and biological substances status: Secondary | ICD-10-CM

## 2012-12-08 DIAGNOSIS — Z951 Presence of aortocoronary bypass graft: Secondary | ICD-10-CM

## 2012-12-08 DIAGNOSIS — I2510 Atherosclerotic heart disease of native coronary artery without angina pectoris: Secondary | ICD-10-CM | POA: Diagnosis present

## 2012-12-08 DIAGNOSIS — Z882 Allergy status to sulfonamides status: Secondary | ICD-10-CM

## 2012-12-08 DIAGNOSIS — I498 Other specified cardiac arrhythmias: Secondary | ICD-10-CM | POA: Diagnosis present

## 2012-12-08 DIAGNOSIS — D6959 Other secondary thrombocytopenia: Secondary | ICD-10-CM | POA: Diagnosis not present

## 2012-12-08 DIAGNOSIS — J9819 Other pulmonary collapse: Secondary | ICD-10-CM | POA: Diagnosis not present

## 2012-12-08 DIAGNOSIS — D62 Acute posthemorrhagic anemia: Secondary | ICD-10-CM | POA: Diagnosis not present

## 2012-12-08 DIAGNOSIS — I10 Essential (primary) hypertension: Secondary | ICD-10-CM | POA: Diagnosis present

## 2012-12-08 DIAGNOSIS — I4891 Unspecified atrial fibrillation: Secondary | ICD-10-CM | POA: Diagnosis present

## 2012-12-08 DIAGNOSIS — D696 Thrombocytopenia, unspecified: Secondary | ICD-10-CM | POA: Diagnosis not present

## 2012-12-08 DIAGNOSIS — I872 Venous insufficiency (chronic) (peripheral): Secondary | ICD-10-CM | POA: Diagnosis present

## 2012-12-08 DIAGNOSIS — J449 Chronic obstructive pulmonary disease, unspecified: Secondary | ICD-10-CM | POA: Diagnosis present

## 2012-12-08 DIAGNOSIS — I459 Conduction disorder, unspecified: Secondary | ICD-10-CM | POA: Diagnosis present

## 2012-12-08 DIAGNOSIS — I472 Ventricular tachycardia, unspecified: Secondary | ICD-10-CM | POA: Diagnosis present

## 2012-12-08 DIAGNOSIS — Z952 Presence of prosthetic heart valve: Secondary | ICD-10-CM

## 2012-12-08 DIAGNOSIS — R0902 Hypoxemia: Secondary | ICD-10-CM | POA: Diagnosis not present

## 2012-12-08 DIAGNOSIS — Z8249 Family history of ischemic heart disease and other diseases of the circulatory system: Secondary | ICD-10-CM

## 2012-12-08 DIAGNOSIS — J90 Pleural effusion, not elsewhere classified: Secondary | ICD-10-CM | POA: Diagnosis not present

## 2012-12-08 DIAGNOSIS — J4489 Other specified chronic obstructive pulmonary disease: Secondary | ICD-10-CM | POA: Diagnosis present

## 2012-12-08 DIAGNOSIS — I739 Peripheral vascular disease, unspecified: Secondary | ICD-10-CM | POA: Diagnosis present

## 2012-12-08 DIAGNOSIS — Z9981 Dependence on supplemental oxygen: Secondary | ICD-10-CM

## 2012-12-08 DIAGNOSIS — I519 Heart disease, unspecified: Secondary | ICD-10-CM | POA: Diagnosis not present

## 2012-12-08 DIAGNOSIS — Z9089 Acquired absence of other organs: Secondary | ICD-10-CM

## 2012-12-08 DIAGNOSIS — J9589 Other postprocedural complications and disorders of respiratory system, not elsewhere classified: Secondary | ICD-10-CM | POA: Diagnosis not present

## 2012-12-08 DIAGNOSIS — E785 Hyperlipidemia, unspecified: Secondary | ICD-10-CM | POA: Diagnosis present

## 2012-12-08 DIAGNOSIS — I4729 Other ventricular tachycardia: Secondary | ICD-10-CM | POA: Diagnosis present

## 2012-12-08 DIAGNOSIS — F17200 Nicotine dependence, unspecified, uncomplicated: Secondary | ICD-10-CM | POA: Diagnosis present

## 2012-12-08 DIAGNOSIS — Y831 Surgical operation with implant of artificial internal device as the cause of abnormal reaction of the patient, or of later complication, without mention of misadventure at the time of the procedure: Secondary | ICD-10-CM | POA: Diagnosis not present

## 2012-12-08 DIAGNOSIS — Y9210 Unspecified residential institution as the place of occurrence of the external cause: Secondary | ICD-10-CM | POA: Diagnosis not present

## 2012-12-08 DIAGNOSIS — Z801 Family history of malignant neoplasm of trachea, bronchus and lung: Secondary | ICD-10-CM

## 2012-12-08 DIAGNOSIS — N390 Urinary tract infection, site not specified: Secondary | ICD-10-CM | POA: Diagnosis present

## 2012-12-08 DIAGNOSIS — D689 Coagulation defect, unspecified: Secondary | ICD-10-CM | POA: Diagnosis present

## 2012-12-08 DIAGNOSIS — B964 Proteus (mirabilis) (morganii) as the cause of diseases classified elsewhere: Secondary | ICD-10-CM | POA: Diagnosis not present

## 2012-12-08 DIAGNOSIS — J988 Other specified respiratory disorders: Secondary | ICD-10-CM | POA: Diagnosis not present

## 2012-12-08 DIAGNOSIS — Z01810 Encounter for preprocedural cardiovascular examination: Secondary | ICD-10-CM

## 2012-12-08 DIAGNOSIS — E876 Hypokalemia: Secondary | ICD-10-CM | POA: Diagnosis not present

## 2012-12-08 DIAGNOSIS — E8779 Other fluid overload: Secondary | ICD-10-CM | POA: Diagnosis not present

## 2012-12-08 LAB — HEPARIN LEVEL (UNFRACTIONATED): Heparin Unfractionated: 0.65 IU/mL (ref 0.30–0.70)

## 2012-12-08 LAB — PROTIME-INR
INR: 1.04 (ref 0.00–1.49)
Prothrombin Time: 13.4 seconds (ref 11.6–15.2)

## 2012-12-08 LAB — CBC
HCT: 41.5 % (ref 39.0–52.0)
Hemoglobin: 14.1 g/dL (ref 13.0–17.0)
MCH: 32.5 pg (ref 26.0–34.0)
MCHC: 34 g/dL (ref 30.0–36.0)
RDW: 13.3 % (ref 11.5–15.5)

## 2012-12-08 LAB — BASIC METABOLIC PANEL
BUN: 16 mg/dL (ref 6–23)
CO2: 23 mEq/L (ref 19–32)
Calcium: 8.9 mg/dL (ref 8.4–10.5)
Creatinine, Ser: 1.09 mg/dL (ref 0.50–1.35)
GFR calc non Af Amer: 66 mL/min — ABNORMAL LOW (ref >90)
Glucose, Bld: 137 mg/dL — ABNORMAL HIGH (ref 70–99)
Potassium: 4.1 mEq/L (ref 3.5–5.1)

## 2012-12-08 MED ORDER — LOSARTAN POTASSIUM 50 MG PO TABS
100.0000 mg | ORAL_TABLET | Freq: Every day | ORAL | Status: DC
  Administered 2012-12-08 – 2012-12-10 (×3): 100 mg via ORAL
  Filled 2012-12-08 (×4): qty 2

## 2012-12-08 MED ORDER — DOCUSATE SODIUM 100 MG PO CAPS: 100.0000 mg | ORAL_CAPSULE | Freq: Two times a day (BID) | ORAL | Status: DC | PRN

## 2012-12-08 MED ORDER — HEPARIN BOLUS VIA INFUSION
2500.0000 [IU] | Freq: Once | INTRAVENOUS | Status: AC
  Administered 2012-12-08: 2500 [IU] via INTRAVENOUS
  Filled 2012-12-08: qty 2500

## 2012-12-08 MED ORDER — ALBUTEROL SULFATE HFA 108 (90 BASE) MCG/ACT IN AERS
2.0000 | INHALATION_SPRAY | RESPIRATORY_TRACT | Status: DC | PRN
  Filled 2012-12-08: qty 6.7

## 2012-12-08 MED ORDER — AMIODARONE HCL 200 MG PO TABS
200.0000 mg | ORAL_TABLET | Freq: Every day | ORAL | Status: DC
  Administered 2012-12-09 – 2012-12-10 (×2): 200 mg via ORAL
  Filled 2012-12-08 (×4): qty 1

## 2012-12-08 MED ORDER — ALBUTEROL SULFATE (5 MG/ML) 0.5% IN NEBU
2.5000 mg | INHALATION_SOLUTION | Freq: Once | RESPIRATORY_TRACT | Status: AC
  Administered 2012-12-08: 2.5 mg via RESPIRATORY_TRACT

## 2012-12-08 MED ORDER — ACETAMINOPHEN 325 MG PO TABS: 650.0000 mg | ORAL_TABLET | Freq: Four times a day (QID) | ORAL | Status: DC | PRN

## 2012-12-08 MED ORDER — HEPARIN (PORCINE) IN NACL 100-0.45 UNIT/ML-% IJ SOLN
1250.0000 [IU]/h | INTRAMUSCULAR | Status: DC
  Administered 2012-12-08 – 2012-12-09 (×3): 1250 [IU]/h via INTRAVENOUS
  Filled 2012-12-08 (×5): qty 250

## 2012-12-08 MED ORDER — ONDANSETRON HCL 4 MG/2ML IJ SOLN: 4.0000 mg | Freq: Four times a day (QID) | INTRAMUSCULAR | Status: DC | PRN

## 2012-12-08 MED ORDER — METOPROLOL TARTRATE 12.5 MG HALF TABLET
12.5000 mg | ORAL_TABLET | Freq: Two times a day (BID) | ORAL | Status: DC
  Administered 2012-12-08 – 2012-12-10 (×4): 12.5 mg via ORAL
  Filled 2012-12-08 (×7): qty 1

## 2012-12-08 MED ORDER — ZOLPIDEM TARTRATE 5 MG PO TABS
10.0000 mg | ORAL_TABLET | Freq: Every evening | ORAL | Status: DC | PRN
  Administered 2012-12-08 – 2012-12-10 (×3): 10 mg via ORAL
  Filled 2012-12-08 (×3): qty 2

## 2012-12-08 MED ORDER — ALBUTEROL SULFATE HFA 108 (90 BASE) MCG/ACT IN AERS
2.0000 | INHALATION_SPRAY | RESPIRATORY_TRACT | Status: DC
  Administered 2012-12-08: 2 via RESPIRATORY_TRACT
  Filled 2012-12-08: qty 6.7
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
## 2012-12-08 NOTE — Progress Notes (Addendum)
ANTICOAGULATION CONSULT NOTE - Initial Consult  Pharmacy Consult for Heparin Indication: Mechanical AVR  Allergies  Allergen Reactions  . Lisinopril Cough  . Sulfa Drugs Cross Reactors Other (See Comments)    Makes patient feel faint    Patient Measurements: Height: 5\' 10"  (177.8 cm) Weight: 192 lb 8 oz (87.317 kg) IBW/kg (Calculated) : 73 Heparin Dosing Weight: 87.3 kg  Vital Signs: Temp: 97.9 F (36.6 C) (11/21 1043) Temp src: Oral (11/21 1043) BP: 158/89 mmHg (11/21 1043) Pulse Rate: 49 (11/21 1043)  Labs: No results found for this basename: HGB, HCT, PLT, APTT, LABPROT, INR, HEPARINUNFRC, CREATININE, CKTOTAL, CKMB, TROPONINI,  in the last 72 hours  Estimated Creatinine Clearance: 67.9 ml/min (by C-G formula based on Cr of 1.03).   Medical History: Past Medical History  Diagnosis Date  . HTN (hypertension)   . COPD (chronic obstructive pulmonary disease)   . Bronchitis   . Afib   . H/O echocardiogram 04/2011  . 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 Shortness of breath     walks 1/2 mile every evening, /w some SOB  . Blood transfusion     many yrs. ago  . Arthritis     shoulders, back & hips   . Hernia, inguinal     will need surgery on this in the future   . AAA (abdominal aortic aneurysm)     Medications:  Prescriptions prior to admission  Medication Sig Dispense Refill  . acetaminophen (TYLENOL) 500 MG tablet Take 500 mg by mouth every 6 (six) hours as needed. For pain      . albuterol (PROVENTIL HFA;VENTOLIN HFA) 108 (90 BASE) MCG/ACT inhaler Inhale 2 puffs into the lungs every 6 (six) hours as needed for shortness of breath.       Marland Kitchen amiodarone (PACERONE) 200 MG tablet Take 200 mg by mouth daily.      . budesonide-formoterol (SYMBICORT) 80-4.5 MCG/ACT inhaler Inhale 2 puffs into the lungs 2 (two) times daily.  1 Inhaler  1  . diflorasone (PSORCON) 0.05 % cream Apply 1 application topically daily as needed. For  eczema      . enoxaparin (LOVENOX) 100 MG/ML injection Inject 0.9 mLs (90 mg total) into the skin every 12 (twelve) hours.  20 Syringe  1  . losartan (COZAAR) 100 MG tablet Take 100 mg by mouth at bedtime.       . metoprolol tartrate (LOPRESSOR) 12.5 mg TABS Take 12.5 mg by mouth 2 (two) times daily.      . nicotine (NICODERM CQ - DOSED IN MG/24 HOURS) 21 mg/24hr patch Place 21 mg onto the skin daily.        Assessment: 72 y/o M with AVR in 1985 on Coumadin PTA (none since 11/17, using LMWH 90mg  BID until today). Recently developed afib. Recent dx AAA with stent graft repair. Now with ascending aortic aneurysm and would benefit from Bentall procedure with bioprosthetic valve on 11/24.  PMH: AAA, PAD, Aneurysm, ascending aorta, HTN, HLD, afib, bruit, AVR 4/13, heavy tobacco use, COPD, arthritis, inguinal hernia  Labs: INR 1.04. BMET and CBC WNL. CrCl 64  Goal of Therapy:  Heparin level 0.3-0.7 units/ml Monitor platelets by anticoagulation protocol: Yes   Plan:  Discontinue LMWH (last dose 14 hrs ago) Start IV heparin with 1/2 bolus (2500 units) Heparin infusion at 1250 units/hr Check heparin level in 6-8 hrs and daily. Will f/u baseline labs  Gregory Turner S. Merilynn Finland, PharmD,  BCPS Clinical Staff Pharmacist Pager [phone]   Gregory Turner 12/08/2012,11:26 AM

## 2012-12-08 NOTE — Care Management Note (Addendum)
    Page 1 of 2   12/26/2012     3:38:23 PM   CARE MANAGEMENT NOTE 12/26/2012  Patient:  Gregory Turner, Gregory Turner   Account Number:  [id number]  Date Initiated:  12/08/2012  Documentation initiated by:  Junius Creamer  Subjective/Objective Assessment:   adm w coumadin heparin before surg     Action/Plan:   lives w wife, pcp dr Katherina Right tate   Anticipated DC Date:  12/26/2012   Anticipated DC Plan:  HOME W HOME HEALTH SERVICES      DC Planning Services  CM consult      [facility] Choice  HOME HEALTH   Choice offered to / List presented to:  C-1 Patient        HH arranged  HH-1 RN  HH-2 PT      [facility] agency  [facility]   Status of service:  Completed, signed off Medicare Important Message given?   (If response is "NO", the following Medicare IM given date fields will be blank) Date Medicare IM given:   Date Additional Medicare IM given:    Discharge Disposition:  HOME W HOME HEALTH SERVICES  Per UR Regulation:  Reviewed for med. necessity/level of care/duration of stay  If discussed at Long Length of Stay Meetings, dates discussed:    Comments:  ContactJohnjoseph, [name] [phone]   [phone]                 Cherylann Ratel Daughter [phone]  12/26/12 [name],RN,BSN [phone] PT FOR DC HOME TODAY.  WILL NEED HH FOLLOW UP AT DC. REFERRAL TO [facility] FOR HH NEEDS, PER PT CHOICE.  START OF CARE 24-48H POST DC DATE.  12/25/12 [name],RN,BSN [phone] PT FOR LIKELY DC HOME TOMORROW WITH FAMILY.  WOULD RECOMMEND HHRN FOR RESTORATIVE CARE AND HHPT, AS RECOMMENDED BY P.T.  MD/PA, IF YOU AGREE, PLEASE ORDER AND COMPLETE FACE TO FACE DOCUMENTATION FOR MEDICARE.  THANKS.  12-12-12 8:45am Avie Arenas, RNBSN [phone] Post op  redo AVR.

## 2012-12-08 NOTE — Progress Notes (Signed)
VASCULAR LAB PRELIMINARY  PRELIMINARY  PRELIMINARY  PRELIMINARY  Pre-op Cardiac Surgery  Carotid Findings:  Carotid completed at [facility] 05/24/2012  Bilateral:  1-39% ICA stenosis.  Vertebral artery flow is antegrade.     Upper Extremity Right Left  Brachial Pressures 138 Triphasic 145 Triphasic  Radial Waveforms Triphasic Triphasic  Ulnar Waveforms Triphasic Monophasic  Palmar Arch (Allen's Test) Normal Abnormal   Findings:  Doppler waveforms remained normal with both radial and ulnar compressions on the right. Left -Doppler waveforms obliterated with radial compression and remained normal with ulnar compressions.    Lower  Extremity Right Left  Dorsalis Pedis 146 Triphasic Unable to evaluate due to constant involuntary jerking of the foot  Posterior Tibial 164 Triphasic 176 Biphasic  Ankle/Brachial Indices 1.13 1.21    Findings:  ABIs indicate normal arterial flow bilaterally at rest.   Coy Vandoren, RVS 12/08/2012, 4:17 PM

## 2012-12-08 NOTE — Progress Notes (Signed)
ANTICOAGULATION CONSULT NOTE   Pharmacy Consult for Heparin Indication: Mechanical AVR  Allergies  Allergen Reactions  . Lisinopril Cough  . Sulfa Drugs Cross Reactors Other (See Comments)    Makes patient feel faint   Labs:  Recent Labs  12/08/12 1300 12/08/12 1823  HGB 14.1  --   HCT 41.5  --   PLT 209  --   LABPROT 13.4  --   INR 1.04  --   HEPARINUNFRC  --  0.65  CREATININE 1.09  --     Estimated Creatinine Clearance: 64.2 ml/min (by C-G formula based on Cr of 1.09).   Medical History: Past Medical History  Diagnosis Date  . HTN (hypertension)   . COPD (chronic obstructive pulmonary disease)   . Bronchitis   . Afib   . H/O echocardiogram 04/2011  . 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 Shortness of breath     walks 1/2 mile every evening, /w some SOB  . Blood transfusion     many yrs. ago  . Arthritis     shoulders, back & hips   . Hernia, inguinal     will need surgery on this in the future   . AAA (abdominal aortic aneurysm)      Assessment: 72 y/o M with AVR in 1985 on Coumadin PTA (none since 11/17, using LMWH 90mg  BID until today). Recently developed afib. Recent dx AAA with stent graft repair. Now with ascending aortic aneurysm and would benefit from Bentall procedure with bioprosthetic valve on 11/24.  PMH: AAA, PAD, Aneurysm, ascending aorta, HTN, HLD, afib, bruit, AVR 4/13, heavy tobacco use, COPD, arthritis, inguinal hernia  Labs: INR 1.04. BMET and CBC WNL. CrCl 64  PM HL = 0.65  Goal of Therapy:  Heparin level 0.3-0.7 units/ml Monitor platelets by anticoagulation protocol: Yes   Plan:  Continue heparin at 1250 units / hr Follow up AM labs  Thank you. Okey Regal, PharmD [phone]  Elwin Sleight 12/08/2012,7:49 PM

## 2012-12-09 DIAGNOSIS — I712 Thoracic aortic aneurysm, without rupture, unspecified: Secondary | ICD-10-CM

## 2012-12-09 LAB — BASIC METABOLIC PANEL
BUN: 16 mg/dL (ref 6–23)
CO2: 26 mEq/L (ref 19–32)
Calcium: 8.6 mg/dL (ref 8.4–10.5)
Chloride: 107 mEq/L (ref 96–112)
Creatinine, Ser: 1.05 mg/dL (ref 0.50–1.35)
GFR calc Af Amer: 80 mL/min — ABNORMAL LOW (ref >90)
GFR calc non Af Amer: 69 mL/min — ABNORMAL LOW (ref >90)
Glucose, Bld: 98 mg/dL (ref 70–99)
Potassium: 4.2 mEq/L (ref 3.5–5.1)
Sodium: 142 mEq/L (ref 135–145)

## 2012-12-09 LAB — SURGICAL PCR SCREEN
MRSA, PCR: NEGATIVE
Staphylococcus aureus: NEGATIVE

## 2012-12-09 LAB — URINALYSIS, ROUTINE W REFLEX MICROSCOPIC
Bilirubin Urine: NEGATIVE
Glucose, UA: NEGATIVE mg/dL
Ketones, ur: NEGATIVE mg/dL
Nitrite: NEGATIVE
Protein, ur: NEGATIVE mg/dL
Specific Gravity, Urine: 1.014 (ref 1.005–1.030)
Urobilinogen, UA: 0.2 mg/dL (ref 0.0–1.0)
pH: 6.5 (ref 5.0–8.0)

## 2012-12-09 LAB — CBC
HCT: 41.6 % (ref 39.0–52.0)
Hemoglobin: 14.1 g/dL (ref 13.0–17.0)
MCH: 32.6 pg (ref 26.0–34.0)
MCHC: 33.9 g/dL (ref 30.0–36.0)
MCV: 96.1 fL (ref 78.0–100.0)
Platelets: 209 10*3/uL (ref 150–400)
RBC: 4.33 MIL/uL (ref 4.22–5.81)
RDW: 13.5 % (ref 11.5–15.5)
WBC: 6.1 10*3/uL (ref 4.0–10.5)

## 2012-12-09 LAB — URINE MICROSCOPIC-ADD ON

## 2012-12-09 LAB — HEPARIN LEVEL (UNFRACTIONATED): Heparin Unfractionated: 0.52 IU/mL (ref 0.30–0.70)

## 2012-12-09 LAB — HEMOGLOBIN A1C
Hgb A1c MFr Bld: 5.4 % (ref <5.7)
Mean Plasma Glucose: 108 mg/dL (ref <117)

## 2012-12-09 LAB — PROTIME-INR
INR: 1.06 (ref 0.00–1.49)
Prothrombin Time: 13.6 seconds (ref 11.6–15.2)

## 2012-12-09 MED ORDER — CIPROFLOXACIN HCL 500 MG PO TABS
500.0000 mg | ORAL_TABLET | Freq: Two times a day (BID) | ORAL | Status: DC
  Administered 2012-12-09 – 2012-12-10 (×2): 500 mg via ORAL
  Filled 2012-12-09 (×7): qty 1

## 2012-12-09 NOTE — Progress Notes (Signed)
CARDIAC REHAB PHASE I   PRE:  Rate/Rhythm: 45 SB    BP: sitting 126/70    SaO2:   MODE:  Ambulation: 890 ft   POST:  Rate/Rhythm: 70 SR    BP: sitting 130/70     SaO2: 96 RA  Tolerated very well. Feels good. Practicing IS with good outcome. Preop ed completed. Gave [facility] book and to watch video with wife.  [phone]   Elissa Lovett Everett CES, ACSM 12/09/2012 9:58 AM

## 2012-12-09 NOTE — Progress Notes (Signed)
ANTICOAGULATION CONSULT NOTE   Pharmacy Consult for Heparin Indication: Mechanical AVR  Allergies  Allergen Reactions  . Lisinopril Cough  . Sulfa Drugs Cross Reactors Other (See Comments)    Makes patient feel faint   Labs:  Recent Labs  12/08/12 1300 12/08/12 1823 12/09/12 0605  HGB 14.1  --  14.1  HCT 41.5  --  41.6  PLT 209  --  209  LABPROT 13.4  --  13.6  INR 1.04  --  1.06  HEPARINUNFRC  --  0.65 0.52  CREATININE 1.09  --  1.05    Estimated Creatinine Clearance: 66.6 ml/min (by C-G formula based on Cr of 1.05).   Medical History: Past Medical History  Diagnosis Date  . HTN (hypertension)   . COPD (chronic obstructive pulmonary disease)   . Bronchitis   . Afib   . H/O echocardiogram 04/2011  . 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 Shortness of breath     walks 1/2 mile every evening, /w some SOB  . Blood transfusion     many yrs. ago  . Arthritis     shoulders, back & hips   . Hernia, inguinal     will need surgery on this in the future   . AAA (abdominal aortic aneurysm)    Assessment: 72 y/o M with AVR in 1985 on Coumadin PTA (none since 11/17, using LMWH 90mg  BID until today). Recently developed afib. Recent dx AAA with stent graft repair. Now with ascending aortic aneurysm and would benefit from Bentall procedure with bioprosthetic valve on 11/24.  PMH: AAA, PAD, Aneurysm, ascending aorta, HTN, HLD, afib, bruit, AVR 4/13, heavy tobacco use, COPD, arthritis, inguinal hernia  Labs: INR 1.06. BMet and CBC WNL  11/21 1823 HL 0.65 11/22 0605 HL 0.52  Goal of Therapy:  Heparin level 0.3-0.7 units/ml Monitor platelets by anticoagulation protocol: Yes   Plan:  Continue heparin infusion at 1250 units/hr Check heparin level daily Will f/u baseline labs   Thank you for allowing me to take part in the care of this patient,  Britt Bottom B. Artelia Laroche, PharmD Clinical Pharmacist - Resident Pager: [phone] Phone:  [phone] 12/09/2012 7:48 AM

## 2012-12-09 NOTE — Progress Notes (Addendum)
      [address]       [place] [address]             [phone]      Subjective:  Gregory Turner has no complaints this morning.  UA obtained yesterday is questionable for UTI, culture is pending.  Patient denies dysuria, hematuria, flank pain.  Objective: Vital signs in last 24 hours: Temp:  [97.5 F (36.4 C)-98 F (36.7 C)] 97.5 F (36.4 C) (11/22 0318) Pulse Rate:  [49-64] 57 (11/22 0318) Cardiac Rhythm:  [-] Sinus bradycardia (11/21 1043) Resp:  [16-20] 18 (11/22 0318) BP: (122-158)/(80-98) 138/98 mmHg (11/22 0318) SpO2:  [93 %-97 %] 97 % (11/22 0318) Weight:  [191 lb 12.8 oz (87 kg)-192 lb 8 oz (87.317 kg)] 191 lb 12.8 oz (87 kg) (11/22 0318)  Intake/Output from previous day: 11/21 0701 - 11/22 0700 In: 240 [P.O.:240] Out: 150 [Urine:150]  General appearance: alert, cooperative and no distress Heart: regular rate and rhythm Lungs: clear to auscultation bilaterally Abdomen: soft, non-tender; bowel sounds normal; no masses,  no organomegaly Extremities: extremities normal, atraumatic, no cyanosis or edema  Lab Results:  Recent Labs  12/08/12 1300 12/09/12 0605  WBC 5.8 6.1  HGB 14.1 14.1  HCT 41.5 41.6  PLT 209 209   BMET:  Recent Labs  12/08/12 1300 12/09/12 0605  NA 138 142  K 4.1 4.2  CL 103 107  CO2 23 26  GLUCOSE 137* 98  BUN 16 16  CREATININE 1.09 1.05  CALCIUM 8.9 8.6    PT/INR:  Recent Labs  12/09/12 0605  LABPROT 13.6  INR 1.06   ABG    Component Value Date/Time   PHART 7.407 06/17/2011 1441   HCO3 22.2 06/17/2011 1441   TCO2 23.3 06/17/2011 1441   ACIDBASEDEF 1.8 06/17/2011 1441   O2SAT 97.4 06/17/2011 1441   CBG (last 3)  No results found for this basename: GLUCAP,  in the last 72 hours  Assessment/Plan: S/P Procedure(s) (LRB): REDO STERNOTOMY (N/A) REDO BENTALL PROCEDURE (N/A) CORONARY ARTERY BYPASS GRAFTING (CABG) (N/A) AXILLARY CANNULATION  (N/A) INTRAOPERATIVE TRANSESOPHAGEAL ECHOCARDIOGRAM  (N/A)  1. Ascending Aortic Aneurysm 5.3 cm  2. S/P mechanical AVR (1985)- continue Heparin per pharmacy, INR 1.06 this morning 3. GU- UA + bacteria, leukocyte esterase, culture pending, will start Cipro as may need Aortic Valve replaced on Monday 4. Hypertension- stable, continue Lopressor, Cozaar 5. Dispo- patient stable, plan for OR on Monday  LOS: 1 day    Lowella Dandy 12/09/2012  Lab Results  Component Value Date   INR 1.06 12/09/2012   INR 1.04 12/08/2012   INR 1.1 11/21/2012   Started  on cipro today for poss UTI I have seen and examined Gregory Turner and agree with the above assessment  and plan.  Delight Ovens MD Beeper [phone] Office [phone] 12/09/2012 12:48 PM

## 2012-12-10 LAB — HEPARIN LEVEL (UNFRACTIONATED): Heparin Unfractionated: 0.33 IU/mL (ref 0.30–0.70)

## 2012-12-10 LAB — CBC
HCT: 40.2 % (ref 39.0–52.0)
Hemoglobin: 13.5 g/dL (ref 13.0–17.0)
MCH: 32.5 pg (ref 26.0–34.0)
MCHC: 33.6 g/dL (ref 30.0–36.0)
MCV: 96.6 fL (ref 78.0–100.0)
Platelets: 188 10*3/uL (ref 150–400)
RBC: 4.16 MIL/uL — ABNORMAL LOW (ref 4.22–5.81)
RDW: 13.7 % (ref 11.5–15.5)
WBC: 5.6 10*3/uL (ref 4.0–10.5)

## 2012-12-10 LAB — BLOOD GAS, ARTERIAL
Acid-base deficit: 0.5 mmol/L (ref 0.0–2.0)
Bicarbonate: 23.7 mEq/L (ref 20.0–24.0)
Drawn by: 23588
O2 Saturation: 97.1 %
Patient temperature: 98.6
TCO2: 24.8 mmol/L (ref 0–100)
pCO2 arterial: 38.7 mmHg (ref 35.0–45.0)
pH, Arterial: 7.404 (ref 7.350–7.450)
pO2, Arterial: 84.9 mmHg (ref 80.0–100.0)

## 2012-12-10 LAB — PROTIME-INR: INR: 1.08 (ref 0.00–1.49)

## 2012-12-10 MED ORDER — DIAZEPAM 5 MG PO TABS
5.0000 mg | ORAL_TABLET | Freq: Once | ORAL | Status: AC
  Administered 2012-12-11: 5 mg via ORAL
  Filled 2012-12-10: qty 1

## 2012-12-10 MED ORDER — VANCOMYCIN HCL 10 G IV SOLR
1250.0000 mg | INTRAVENOUS | Status: DC
  Filled 2012-12-10: qty 1250

## 2012-12-10 MED ORDER — METOPROLOL TARTRATE 12.5 MG HALF TABLET
12.5000 mg | ORAL_TABLET | Freq: Once | ORAL | Status: AC
  Administered 2012-12-11: 12.5 mg via ORAL
  Filled 2012-12-10: qty 1

## 2012-12-10 MED ORDER — SODIUM CHLORIDE 0.9 % IV SOLN
INTRAVENOUS | Status: DC
  Filled 2012-12-10: qty 30

## 2012-12-10 MED ORDER — EPINEPHRINE HCL 1 MG/ML IJ SOLN
0.5000 ug/min | INTRAVENOUS | Status: DC
  Filled 2012-12-10: qty 4

## 2012-12-10 MED ORDER — CHLORHEXIDINE GLUCONATE 4 % EX LIQD
60.0000 mL | Freq: Once | CUTANEOUS | Status: AC
  Administered 2012-12-10: 4 via TOPICAL
  Filled 2012-12-10: qty 60

## 2012-12-10 MED ORDER — TEMAZEPAM 15 MG PO CAPS: 15.0000 mg | ORAL_CAPSULE | Freq: Once | ORAL | Status: AC | PRN

## 2012-12-10 MED ORDER — CHLORHEXIDINE GLUCONATE 4 % EX LIQD
60.0000 mL | Freq: Once | CUTANEOUS | Status: AC
  Administered 2012-12-11: 4 via TOPICAL
  Filled 2012-12-10: qty 60

## 2012-12-10 MED ORDER — DIAZEPAM 5 MG PO TABS: 5.0000 mg | ORAL_TABLET | ORAL | Status: DC | PRN

## 2012-12-10 MED ORDER — VANCOMYCIN HCL 10 G IV SOLR
1500.0000 mg | INTRAVENOUS | Status: AC
  Administered 2012-12-11: 1500 mg via INTRAVENOUS
  Filled 2012-12-10: qty 1500

## 2012-12-10 MED ORDER — POTASSIUM CHLORIDE 2 MEQ/ML IV SOLN
80.0000 meq | INTRAVENOUS | Status: DC
  Filled 2012-12-10: qty 40

## 2012-12-10 MED ORDER — NITROGLYCERIN IN D5W 200-5 MCG/ML-% IV SOLN
2.0000 ug/min | INTRAVENOUS | Status: DC
  Filled 2012-12-10: qty 250

## 2012-12-10 MED ORDER — DEXTROSE 5 % IV SOLN
750.0000 mg | INTRAVENOUS | Status: DC
  Filled 2012-12-10: qty 750

## 2012-12-10 MED ORDER — DOPAMINE-DEXTROSE 3.2-5 MG/ML-% IV SOLN
2.0000 ug/kg/min | INTRAVENOUS | Status: DC
  Filled 2012-12-10: qty 250

## 2012-12-10 MED ORDER — INSULIN REGULAR HUMAN 100 UNIT/ML IJ SOLN
INTRAMUSCULAR | Status: DC
  Filled 2012-12-10: qty 1

## 2012-12-10 MED ORDER — MAGNESIUM SULFATE 50 % IJ SOLN
40.0000 meq | INTRAMUSCULAR | Status: DC
  Filled 2012-12-10: qty 10

## 2012-12-10 MED ORDER — PHENYLEPHRINE HCL 10 MG/ML IJ SOLN
30.0000 ug/min | INTRAVENOUS | Status: DC
  Filled 2012-12-10 (×2): qty 2

## 2012-12-10 MED ORDER — DEXTROSE 5 % IV SOLN
1.5000 g | INTRAVENOUS | Status: AC
  Administered 2012-12-11: 1.5 g via INTRAVENOUS
  Filled 2012-12-10: qty 1.5

## 2012-12-10 MED ORDER — SODIUM CHLORIDE 0.9 % IV SOLN
INTRAVENOUS | Status: DC
  Filled 2012-12-10: qty 40

## 2012-12-10 MED ORDER — PLASMA-LYTE 148 IV SOLN
INTRAVENOUS | Status: AC
  Administered 2012-12-11: 08:00:00
  Filled 2012-12-10: qty 2.5

## 2012-12-10 MED ORDER — BISACODYL 5 MG PO TBEC
5.0000 mg | DELAYED_RELEASE_TABLET | Freq: Once | ORAL | Status: AC
  Administered 2012-12-10: 5 mg via ORAL
  Filled 2012-12-10: qty 1

## 2012-12-10 MED ORDER — DEXMEDETOMIDINE HCL IN NACL 400 MCG/100ML IV SOLN
0.1000 ug/kg/h | INTRAVENOUS | Status: DC
  Filled 2012-12-10: qty 100

## 2012-12-10 NOTE — Progress Notes (Signed)
ANTICOAGULATION CONSULT NOTE   Pharmacy Consult for Heparin Indication: Mechanical AVR  Allergies  Allergen Reactions  . Lisinopril Cough  . Sulfa Drugs Cross Reactors Other (See Comments)    Makes patient feel faint   Labs:  Recent Labs  12/08/12 1300 12/08/12 1823 12/09/12 0605 12/10/12 0537  HGB 14.1  --  14.1 13.5  HCT 41.5  --  41.6 40.2  PLT 209  --  209 188  LABPROT 13.4  --  13.6 13.8  INR 1.04  --  1.06 1.08  HEPARINUNFRC  --  0.65 0.52 0.33  CREATININE 1.09  --  1.05  --     Estimated Creatinine Clearance: 66.6 ml/min (by C-G formula based on Cr of 1.05).   Medical History: Past Medical History  Diagnosis Date  . HTN (hypertension)   . COPD (chronic obstructive pulmonary disease)   . Bronchitis   . Afib   . H/O echocardiogram 04/2011  . 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 Shortness of breath     walks 1/2 mile every evening, /w some SOB  . Blood transfusion     many yrs. ago  . Arthritis     shoulders, back & hips   . Hernia, inguinal     will need surgery on this in the future   . AAA (abdominal aortic aneurysm)    Assessment: 72 y/o M with AVR in 1985 on Coumadin PTA (none since 11/17, using LMWH 90mg  BID until today). Recently developed afib. Recent dx AAA with stent graft repair. Now with ascending aortic aneurysm and would benefit from Bentall procedure with bioprosthetic valve on 11/24.  PMH: AAA, PAD, Aneurysm, ascending aorta, HTN, HLD, afib, bruit, AVR 4/13, heavy tobacco use, COPD, arthritis, inguinal hernia  Labs: INR 1.06>1.08 this AM. BMet and CBC WNL  11/21 1823 HL 0.65 11/22 0605 HL 0.52 11/23 0532 HL 0.33  Goal of Therapy:  Heparin level 0.3-0.7 units/ml Monitor platelets by anticoagulation protocol: Yes   Plan:  HL fell from yesterday, but still therapeutic.  Will continue heparin infusion at 1250 units/hr, pt anticipated to go to OR tomorrow. Check heparin level daily Will f/u  baseline labs   Thank you for allowing me to take part in the care of this patient,  Britt Bottom B. Artelia Laroche, PharmD Clinical Pharmacist - Resident Pager: [phone] Phone: [phone] 12/10/2012 7:37 AM

## 2012-12-10 NOTE — Progress Notes (Addendum)
      [address]       Gregory Turner [address]             [phone]       Subjective:  Gregory Turner is without complaints this morning.  The patient continues to deny urinary symptoms.  Dr. Donata Clay at bedside providing preop surgical instruction.    Objective: Vital signs in last 24 hours: Temp:  [97.6 F (36.4 C)-98.1 F (36.7 C)] 97.6 F (36.4 C) (11/23 0500) Pulse Rate:  [48-55] 55 (11/23 0500) Cardiac Rhythm:  [-] Sinus bradycardia;Heart block (11/22 2100) Resp:  [18-20] 20 (11/23 0500) BP: (113-163)/(61-83) 143/80 mmHg (11/23 0500) SpO2:  [94 %-96 %] 94 % (11/23 0500)  Intake/Output from previous day: 11/22 0701 - 11/23 0700 In: 600 [P.O.:600] Out: -   General appearance: alert, cooperative and no distress Heart: regular rate and rhythm Lungs: wheezes bilaterally and expiratory Abdomen: soft, non-tender; bowel sounds normal; no masses,  no organomegaly Extremities: venous stasis changes Wound: clean and dry  Lab Results:  Recent Labs  12/09/12 0605 12/10/12 0537  WBC 6.1 5.6  HGB 14.1 13.5  HCT 41.6 40.2  PLT 209 188   BMET:  Recent Labs  12/08/12 1300 12/09/12 0605  NA 138 142  K 4.1 4.2  CL 103 107  CO2 23 26  GLUCOSE 137* 98  BUN 16 16  CREATININE 1.09 1.05  CALCIUM 8.9 8.6    PT/INR:  Recent Labs  12/10/12 0537  LABPROT 13.8  INR 1.08   ABG    Component Value Date/Time   PHART 7.407 06/17/2011 1441   HCO3 22.2 06/17/2011 1441   TCO2 23.3 06/17/2011 1441   ACIDBASEDEF 1.8 06/17/2011 1441   O2SAT 97.4 06/17/2011 1441   CBG (last 3)  No results found for this basename: GLUCAP,  in the last 72 hours  Assessment/Plan: S/P Procedure(s) (LRB): REDO STERNOTOMY (N/A) REDO BENTALL PROCEDURE (N/A) CORONARY ARTERY BYPASS GRAFTING (CABG) (N/A) AXILLARY CANNULATION  (N/A) INTRAOPERATIVE TRANSESOPHAGEAL ECHOCARDIOGRAM (N/A)  1. Ascending Aortic Aneurysm 2. S/P mechanical AVR- off coumadin, on Heparin per pharmacy for  bridge, INR 1.08 3. GU- suspected UTI, culture + for Proteus Mirabilis- will continue Cipro for now, sensitivities pending 4. HTN- continue Lopressor, Beta Blocker 5. Dispo- NPO at midnight, pre op orders placed per PVT, plan for OR in AM   LOS: 2 days    BARRETT, ERIN 12/10/2012  I have seen and examined Gregory Turner and agree with the above assessment  and plan.  Delight Ovens MD Beeper [phone] Office [phone] 12/10/2012 1:23 PM

## 2012-12-11 ENCOUNTER — Inpatient Hospital Stay (HOSPITAL_COMMUNITY): Payer: Medicare PPO

## 2012-12-11 ENCOUNTER — Encounter (HOSPITAL_COMMUNITY)
Admission: RE | Disposition: A | Discharge status: Home Health | Payer: Medicare PPO | Source: Ambulatory Visit | Attending: Cardiothoracic Surgery

## 2012-12-11 ENCOUNTER — Encounter (HOSPITAL_COMMUNITY): Payer: Medicare PPO | Admitting: Anesthesiology

## 2012-12-11 ENCOUNTER — Encounter (HOSPITAL_COMMUNITY): Payer: Self-pay | Admitting: Anesthesiology

## 2012-12-11 ENCOUNTER — Inpatient Hospital Stay (HOSPITAL_COMMUNITY): Payer: Medicare PPO | Admitting: Anesthesiology

## 2012-12-11 DIAGNOSIS — I712 Thoracic aortic aneurysm, without rupture, unspecified: Secondary | ICD-10-CM

## 2012-12-11 DIAGNOSIS — I359 Nonrheumatic aortic valve disorder, unspecified: Secondary | ICD-10-CM

## 2012-12-11 HISTORY — PX: INTRAOPERATIVE TRANSESOPHAGEAL ECHOCARDIOGRAM: SHX5062

## 2012-12-11 HISTORY — PX: CANNULATION FOR CARDIOPULMONARY BYPASS: SHX6411

## 2012-12-11 HISTORY — PX: BENTALL PROCEDURE: SHX5058

## 2012-12-11 LAB — POCT I-STAT 4, (NA,K, GLUC, HGB,HCT)
Glucose, Bld: 100 mg/dL — ABNORMAL HIGH (ref 70–99)
Glucose, Bld: 96 mg/dL (ref 70–99)
Glucose, Bld: 98 mg/dL (ref 70–99)
Glucose, Bld: 96 mg/dL (ref 70–99)
Glucose, Bld: 103 mg/dL — ABNORMAL HIGH (ref 70–99)
Glucose, Bld: 114 mg/dL — ABNORMAL HIGH (ref 70–99)
Glucose, Bld: 154 mg/dL — ABNORMAL HIGH (ref 70–99)
Glucose, Bld: 183 mg/dL — ABNORMAL HIGH (ref 70–99)
Glucose, Bld: 199 mg/dL — ABNORMAL HIGH (ref 70–99)
Glucose, Bld: 236 mg/dL — ABNORMAL HIGH (ref 70–99)
Glucose, Bld: 171 mg/dL — ABNORMAL HIGH (ref 70–99)
HCT: 38 % — ABNORMAL LOW (ref 39.0–52.0)
HCT: 29 % — ABNORMAL LOW (ref 39.0–52.0)
HCT: 34 % — ABNORMAL LOW (ref 39.0–52.0)
HCT: 29 % — ABNORMAL LOW (ref 39.0–52.0)
HCT: 29 % — ABNORMAL LOW (ref 39.0–52.0)
HCT: 26 % — ABNORMAL LOW (ref 39.0–52.0)
HCT: 26 % — ABNORMAL LOW (ref 39.0–52.0)
HCT: 20 % — ABNORMAL LOW (ref 39.0–52.0)
HCT: 28 % — ABNORMAL LOW (ref 39.0–52.0)
HCT: 32 % — ABNORMAL LOW (ref 39.0–52.0)
HCT: 38 % — ABNORMAL LOW (ref 39.0–52.0)
Hemoglobin: 12.9 g/dL — ABNORMAL LOW (ref 13.0–17.0)
Hemoglobin: 11.6 g/dL — ABNORMAL LOW (ref 13.0–17.0)
Hemoglobin: 9.9 g/dL — ABNORMAL LOW (ref 13.0–17.0)
Hemoglobin: 9.9 g/dL — ABNORMAL LOW (ref 13.0–17.0)
Hemoglobin: 9.9 g/dL — ABNORMAL LOW (ref 13.0–17.0)
Hemoglobin: 8.8 g/dL — ABNORMAL LOW (ref 13.0–17.0)
Hemoglobin: 8.8 g/dL — ABNORMAL LOW (ref 13.0–17.0)
Hemoglobin: 6.8 g/dL — CL (ref 13.0–17.0)
Hemoglobin: 9.5 g/dL — ABNORMAL LOW (ref 13.0–17.0)
Hemoglobin: 10.9 g/dL — ABNORMAL LOW (ref 13.0–17.0)
Hemoglobin: 12.9 g/dL — ABNORMAL LOW (ref 13.0–17.0)
Potassium: 3.9 mEq/L (ref 3.5–5.1)
Potassium: 4.2 mEq/L (ref 3.5–5.1)
Potassium: 4.3 mEq/L (ref 3.5–5.1)
Potassium: 5.3 mEq/L — ABNORMAL HIGH (ref 3.5–5.1)
Potassium: 6.1 mEq/L — ABNORMAL HIGH (ref 3.5–5.1)
Potassium: 5.8 mEq/L — ABNORMAL HIGH (ref 3.5–5.1)
Potassium: 5.5 mEq/L — ABNORMAL HIGH (ref 3.5–5.1)
Potassium: 5.5 mEq/L — ABNORMAL HIGH (ref 3.5–5.1)
Potassium: 4.9 mEq/L (ref 3.5–5.1)
Potassium: 5.1 mEq/L (ref 3.5–5.1)
Potassium: 4.1 mEq/L (ref 3.5–5.1)
Sodium: 140 mEq/L (ref 135–145)
Sodium: 140 mEq/L (ref 135–145)
Sodium: 140 mEq/L (ref 135–145)
Sodium: 132 mEq/L — ABNORMAL LOW (ref 135–145)
Sodium: 135 mEq/L (ref 135–145)
Sodium: 134 mEq/L — ABNORMAL LOW (ref 135–145)
Sodium: 136 mEq/L (ref 135–145)
Sodium: 136 mEq/L (ref 135–145)
Sodium: 137 mEq/L (ref 135–145)
Sodium: 139 mEq/L (ref 135–145)
Sodium: 142 mEq/L (ref 135–145)

## 2012-12-11 LAB — CBC
HCT: 41.7 % (ref 39.0–52.0)
HCT: 41.2 % (ref 39.0–52.0)
HCT: 39.7 % (ref 39.0–52.0)
Hemoglobin: 14.1 g/dL (ref 13.0–17.0)
MCH: 32.6 pg (ref 26.0–34.0)
MCH: 31.2 pg (ref 26.0–34.0)
MCHC: 33.8 g/dL (ref 30.0–36.0)
MCHC: 34.2 g/dL (ref 30.0–36.0)
MCHC: 35.3 g/dL (ref 30.0–36.0)
MCV: 96.3 fL (ref 78.0–100.0)
MCV: 88.4 fL (ref 78.0–100.0)
Platelets: 182 10*3/uL (ref 150–400)
Platelets: 85 10*3/uL — ABNORMAL LOW (ref 150–400)
Platelets: 67 10*3/uL — ABNORMAL LOW (ref 150–400)
RBC: 4.33 MIL/uL (ref 4.22–5.81)
RDW: 13.4 % (ref 11.5–15.5)
RDW: 15.5 % (ref 11.5–15.5)
RDW: 15.7 % — ABNORMAL HIGH (ref 11.5–15.5)
WBC: 5.6 10*3/uL (ref 4.0–10.5)
WBC: 11.7 10*3/uL — ABNORMAL HIGH (ref 4.0–10.5)
WBC: 14.1 10*3/uL — ABNORMAL HIGH (ref 4.0–10.5)

## 2012-12-11 LAB — POCT I-STAT 3, ART BLOOD GAS (G3+)
Acid-Base Excess: 1 mmol/L (ref 0.0–2.0)
Acid-base deficit: 1 mmol/L (ref 0.0–2.0)
Acid-base deficit: 2 mmol/L (ref 0.0–2.0)
Acid-base deficit: 4 mmol/L — ABNORMAL HIGH (ref 0.0–2.0)
Acid-base deficit: 4 mmol/L — ABNORMAL HIGH (ref 0.0–2.0)
Acid-base deficit: 4 mmol/L — ABNORMAL HIGH (ref 0.0–2.0)
Bicarbonate: 27.2 mEq/L — ABNORMAL HIGH (ref 20.0–24.0)
Bicarbonate: 23.7 mEq/L (ref 20.0–24.0)
Bicarbonate: 25 mEq/L — ABNORMAL HIGH (ref 20.0–24.0)
Bicarbonate: 23.3 mEq/L (ref 20.0–24.0)
Bicarbonate: 23.1 mEq/L (ref 20.0–24.0)
Bicarbonate: 24.2 mEq/L — ABNORMAL HIGH (ref 20.0–24.0)
O2 Saturation: 100 %
O2 Saturation: 100 %
O2 Saturation: 100 %
O2 Saturation: 100 %
O2 Saturation: 100 %
O2 Saturation: 99 %
Patient temperature: 35
TCO2: 29 mmol/L (ref 0–100)
TCO2: 25 mmol/L (ref 0–100)
TCO2: 27 mmol/L (ref 0–100)
TCO2: 25 mmol/L (ref 0–100)
TCO2: 25 mmol/L (ref 0–100)
TCO2: 26 mmol/L (ref 0–100)
pCO2 arterial: 47.6 mmHg — ABNORMAL HIGH (ref 35.0–45.0)
pCO2 arterial: 39 mmHg (ref 35.0–45.0)
pCO2 arterial: 56.6 mmHg — ABNORMAL HIGH (ref 35.0–45.0)
pCO2 arterial: 53.7 mmHg — ABNORMAL HIGH (ref 35.0–45.0)
pCO2 arterial: 47.9 mmHg — ABNORMAL HIGH (ref 35.0–45.0)
pCO2 arterial: 52.1 mmHg — ABNORMAL HIGH (ref 35.0–45.0)
pH, Arterial: 7.366 (ref 7.350–7.450)
pH, Arterial: 7.392 (ref 7.350–7.450)
pH, Arterial: 7.253 — ABNORMAL LOW (ref 7.350–7.450)
pH, Arterial: 7.245 — ABNORMAL LOW (ref 7.350–7.450)
pH, Arterial: 7.291 — ABNORMAL LOW (ref 7.350–7.450)
pH, Arterial: 7.265 — ABNORMAL LOW (ref 7.350–7.450)
pO2, Arterial: 477 mmHg — ABNORMAL HIGH (ref 80.0–100.0)
pO2, Arterial: 330 mmHg — ABNORMAL HIGH (ref 80.0–100.0)
pO2, Arterial: 360 mmHg — ABNORMAL HIGH (ref 80.0–100.0)
pO2, Arterial: 275 mmHg — ABNORMAL HIGH (ref 80.0–100.0)
pO2, Arterial: 260 mmHg — ABNORMAL HIGH (ref 80.0–100.0)
pO2, Arterial: 158 mmHg — ABNORMAL HIGH (ref 80.0–100.0)

## 2012-12-11 LAB — URINE CULTURE: Colony Count: >=100000

## 2012-12-11 LAB — HEMOGLOBIN AND HEMATOCRIT, BLOOD
HCT: 27.4 % — ABNORMAL LOW (ref 39.0–52.0)
Hemoglobin: 9.4 g/dL — ABNORMAL LOW (ref 13.0–17.0)

## 2012-12-11 LAB — DIC (DISSEMINATED INTRAVASCULAR COAGULATION)PANEL
D-Dimer, Quant: 2.64 ug/mL-FEU — ABNORMAL HIGH (ref 0.00–0.48)
Fibrinogen: 192 mg/dL — ABNORMAL LOW (ref 204–475)
Platelets: 86 10*3/uL — ABNORMAL LOW (ref 150–400)
Smear Review: NONE SEEN
aPTT: 43 seconds — ABNORMAL HIGH (ref 24–37)

## 2012-12-11 LAB — POCT I-STAT GLUCOSE
Glucose, Bld: 99 mg/dL (ref 70–99)
Operator id: 402921

## 2012-12-11 LAB — PROTIME-INR: INR: 1.02 (ref 0.00–1.49)

## 2012-12-11 LAB — HEPARIN LEVEL (UNFRACTIONATED): Heparin Unfractionated: 0.34 IU/mL (ref 0.30–0.70)

## 2012-12-11 LAB — PLATELET COUNT: Platelets: 89 10*3/uL — ABNORMAL LOW (ref 150–400)

## 2012-12-11 SURGERY — REDO STERNOTOMY
Anesthesia: General | Site: Chest | Wound class: Clean

## 2012-12-11 MED ORDER — SODIUM CHLORIDE 0.9 % IV SOLN
INTRAVENOUS | Status: DC
  Administered 2012-12-11: 22:00:00 via INTRAVENOUS

## 2012-12-11 MED ORDER — 0.9 % SODIUM CHLORIDE (POUR BTL) OPTIME
TOPICAL | Status: DC | PRN
  Administered 2012-12-11: 2000 mL

## 2012-12-11 MED ORDER — SODIUM CHLORIDE 0.9 % IV SOLN: 250.0000 mL | INTRAVENOUS | Status: DC

## 2012-12-11 MED ORDER — FAMOTIDINE IN NACL 20-0.9 MG/50ML-% IV SOLN
20.0000 mg | Freq: Two times a day (BID) | INTRAVENOUS | Status: AC
  Administered 2012-12-11 – 2012-12-12 (×2): 20 mg via INTRAVENOUS
  Filled 2012-12-11: qty 50

## 2012-12-11 MED ORDER — ACETAMINOPHEN 160 MG/5ML PO SOLN: 650.0000 mg | Freq: Once | ORAL | Status: AC

## 2012-12-11 MED ORDER — ACETAMINOPHEN 160 MG/5ML PO SOLN
1000.0000 mg | Freq: Four times a day (QID) | ORAL | Status: DC
  Administered 2012-12-12 (×2): 1000 mg
  Filled 2012-12-11 (×2): qty 40.6

## 2012-12-11 MED ORDER — VECURONIUM BROMIDE 10 MG IV SOLR
INTRAVENOUS | Status: DC | PRN
  Administered 2012-12-11: 16 mg via INTRAVENOUS
  Administered 2012-12-11: 4 mg via INTRAVENOUS

## 2012-12-11 MED ORDER — ASPIRIN EC 325 MG PO TBEC
325.0000 mg | DELAYED_RELEASE_TABLET | Freq: Every day | ORAL | Status: DC
  Filled 2012-12-11 (×2): qty 1

## 2012-12-11 MED ORDER — SODIUM CHLORIDE 0.45 % IV SOLN: INTRAVENOUS | Status: DC

## 2012-12-11 MED ORDER — FENTANYL CITRATE 0.05 MG/ML IJ SOLN
INTRAMUSCULAR | Status: DC | PRN
  Administered 2012-12-11: 400 ug via INTRAVENOUS
  Administered 2012-12-11: 300 ug via INTRAVENOUS
  Administered 2012-12-11: 200 ug via INTRAVENOUS
  Administered 2012-12-11: 100 ug via INTRAVENOUS
  Administered 2012-12-11: 400 ug via INTRAVENOUS
  Administered 2012-12-11: 100 ug via INTRAVENOUS

## 2012-12-11 MED ORDER — ASPIRIN 81 MG PO CHEW
324.0000 mg | CHEWABLE_TABLET | Freq: Every day | ORAL | Status: DC
  Administered 2012-12-12: 324 mg
  Filled 2012-12-11: qty 4

## 2012-12-11 MED ORDER — ROCURONIUM BROMIDE 100 MG/10ML IV SOLN
INTRAVENOUS | Status: DC | PRN
  Administered 2012-12-11: 40 mg via INTRAVENOUS

## 2012-12-11 MED ORDER — CALCIUM CHLORIDE 10 % IV SOLN
INTRAVENOUS | Status: DC | PRN
  Administered 2012-12-11: 800 mg via INTRAVENOUS
  Administered 2012-12-11: 1000 mg via INTRAVENOUS

## 2012-12-11 MED ORDER — INSULIN REGULAR BOLUS VIA INFUSION
0.0000 [IU] | Freq: Three times a day (TID) | INTRAVENOUS | Status: DC
  Filled 2012-12-11: qty 10

## 2012-12-11 MED ORDER — COAGULATION FACTOR VIIA RECOMB 1 MG IV SOLR: 90.0000 ug/kg | Freq: Once | INTRAVENOUS | Status: DC

## 2012-12-11 MED ORDER — ALBUMIN HUMAN 5 % IV SOLN
INTRAVENOUS | Status: DC | PRN
  Administered 2012-12-11: 18:00:00 via INTRAVENOUS

## 2012-12-11 MED ORDER — METHYLPREDNISOLONE SODIUM SUCC 125 MG IJ SOLR
250.0000 mg | Freq: Once | INTRAMUSCULAR | Status: AC
  Administered 2012-12-11: 250 mg via INTRAVENOUS
  Filled 2012-12-11: qty 4

## 2012-12-11 MED ORDER — SODIUM CHLORIDE 0.9 % IV SOLN
1.0000 g/h | Freq: Once | INTRAVENOUS | Status: DC
  Filled 2012-12-11: qty 20

## 2012-12-11 MED ORDER — DOPAMINE-DEXTROSE 3.2-5 MG/ML-% IV SOLN
0.0000 ug/kg/min | INTRAVENOUS | Status: DC
  Administered 2012-12-12 – 2012-12-17 (×3): 3 ug/kg/min via INTRAVENOUS
  Filled 2012-12-11 (×3): qty 250

## 2012-12-11 MED ORDER — SODIUM CHLORIDE 0.9 % IJ SOLN
OROMUCOSAL | Status: DC | PRN
  Administered 2012-12-11: 08:00:00 via TOPICAL

## 2012-12-11 MED ORDER — COAGULATION FACTOR VIIA RECOMB 1 MG IV SOLR
90.0000 ug/kg | Freq: Once | INTRAVENOUS | Status: AC
  Administered 2012-12-11 (×2): 4000 ug via INTRAVENOUS
  Filled 2012-12-11: qty 8

## 2012-12-11 MED ORDER — VANCOMYCIN HCL IN DEXTROSE 1-5 GM/200ML-% IV SOLN
1000.0000 mg | Freq: Two times a day (BID) | INTRAVENOUS | Status: AC
  Administered 2012-12-12 – 2012-12-13 (×3): 1000 mg via INTRAVENOUS
  Filled 2012-12-11 (×3): qty 200

## 2012-12-11 MED ORDER — SODIUM CHLORIDE 0.9 % IV SOLN
10.0000 g | INTRAVENOUS | Status: DC | PRN
  Administered 2012-12-11: 5 g/h via INTRAVENOUS

## 2012-12-11 MED ORDER — BUDESONIDE-FORMOTEROL FUMARATE 160-4.5 MCG/ACT IN AERO
2.0000 | INHALATION_SPRAY | Freq: Two times a day (BID) | RESPIRATORY_TRACT | Status: DC
  Administered 2012-12-12 – 2012-12-26 (×27): 2 via RESPIRATORY_TRACT
  Filled 2012-12-11 (×2): qty 6

## 2012-12-11 MED ORDER — ETOMIDATE 2 MG/ML IV SOLN
INTRAVENOUS | Status: DC | PRN
  Administered 2012-12-11: 20 mg via INTRAVENOUS

## 2012-12-11 MED ORDER — LACTATED RINGERS IV SOLN: 500.0000 mL | Freq: Once | INTRAVENOUS | Status: AC | PRN

## 2012-12-11 MED ORDER — MAGNESIUM SULFATE 40 MG/ML IJ SOLN
4.0000 g | Freq: Once | INTRAMUSCULAR | Status: AC
  Administered 2012-12-11: 4 g via INTRAVENOUS
  Filled 2012-12-11: qty 100

## 2012-12-11 MED ORDER — SODIUM BICARBONATE 8.4 % IV SOLN
INTRAVENOUS | Status: DC | PRN
  Administered 2012-12-11 (×2): 50 meq via INTRAVENOUS

## 2012-12-11 MED ORDER — SODIUM CHLORIDE 0.9 % IR SOLN
Status: DC | PRN
  Administered 2012-12-11: 1

## 2012-12-11 MED ORDER — HEMOSTATIC AGENTS (NO CHARGE) OPTIME
TOPICAL | Status: DC | PRN
  Administered 2012-12-11 (×2): 1 via TOPICAL

## 2012-12-11 MED ORDER — MIDAZOLAM HCL 5 MG/5ML IJ SOLN
INTRAMUSCULAR | Status: DC | PRN
  Administered 2012-12-11: 2 mg via INTRAVENOUS
  Administered 2012-12-11: 5 mg via INTRAVENOUS
  Administered 2012-12-11: 3 mg via INTRAVENOUS
  Administered 2012-12-11: 4 mg via INTRAVENOUS

## 2012-12-11 MED ORDER — MIDAZOLAM HCL 2 MG/2ML IJ SOLN: 2.0000 mg | INTRAMUSCULAR | Status: DC | PRN

## 2012-12-11 MED ORDER — PROTAMINE SULFATE 10 MG/ML IV SOLN
INTRAVENOUS | Status: DC | PRN
  Administered 2012-12-11: 270 mg via INTRAVENOUS

## 2012-12-11 MED ORDER — PROPOFOL 10 MG/ML IV BOLUS
INTRAVENOUS | Status: DC | PRN
  Administered 2012-12-11: 60 mg via INTRAVENOUS

## 2012-12-11 MED ORDER — ALBUMIN HUMAN 5 % IV SOLN
250.0000 mL | INTRAVENOUS | Status: AC | PRN
  Administered 2012-12-11 – 2012-12-12 (×3): 250 mL via INTRAVENOUS
  Filled 2012-12-11 (×2): qty 250

## 2012-12-11 MED ORDER — SODIUM CHLORIDE 0.9 % IV SOLN
INTRAVENOUS | Status: DC
  Administered 2012-12-11: 22:00:00 via INTRAVENOUS
  Administered 2012-12-12: 1.5 [IU]/h via INTRAVENOUS
  Filled 2012-12-11 (×2): qty 1

## 2012-12-11 MED ORDER — SODIUM CHLORIDE 0.9 % IV SOLN
200.0000 ug | INTRAVENOUS | Status: DC | PRN
  Administered 2012-12-11: 0.2 ug/kg/h via INTRAVENOUS

## 2012-12-11 MED ORDER — DEXTROSE 5 % IV SOLN
1.5000 g | Freq: Two times a day (BID) | INTRAVENOUS | Status: AC
  Administered 2012-12-12 – 2012-12-13 (×4): 1.5 g via INTRAVENOUS
  Filled 2012-12-11 (×4): qty 1.5

## 2012-12-11 MED ORDER — MILRINONE IN DEXTROSE 20 MG/100ML IV SOLN
0.2500 ug/kg/min | INTRAVENOUS | Status: DC
  Filled 2012-12-11 (×2): qty 100

## 2012-12-11 MED ORDER — NITROGLYCERIN IN D5W 200-5 MCG/ML-% IV SOLN
INTRAVENOUS | Status: DC | PRN
  Administered 2012-12-11: 5 ug/min via INTRAVENOUS

## 2012-12-11 MED ORDER — LACTATED RINGERS IV SOLN
INTRAVENOUS | Status: DC | PRN
  Administered 2012-12-11: 17:00:00 via INTRAVENOUS

## 2012-12-11 MED ORDER — SODIUM CHLORIDE 0.9 % IV SOLN
100.0000 [IU] | INTRAVENOUS | Status: DC | PRN
  Administered 2012-12-11: 1 [IU]/h via INTRAVENOUS

## 2012-12-11 MED ORDER — PHENYLEPHRINE HCL 10 MG/ML IJ SOLN
10.0000 mg | INTRAVENOUS | Status: DC | PRN
  Administered 2012-12-11: 40 ug/min via INTRAVENOUS

## 2012-12-11 MED ORDER — HEPARIN SODIUM (PORCINE) 1000 UNIT/ML IJ SOLN
INTRAMUSCULAR | Status: DC | PRN
  Administered 2012-12-11: 2 mL via INTRAVENOUS
  Administered 2012-12-11: 40 mL via INTRAVENOUS
  Administered 2012-12-11: 2 mL via INTRAVENOUS

## 2012-12-11 MED ORDER — NITROGLYCERIN IN D5W 200-5 MCG/ML-% IV SOLN: 0.0000 ug/min | INTRAVENOUS | Status: DC

## 2012-12-11 MED ORDER — DEXMEDETOMIDINE HCL IN NACL 200 MCG/50ML IV SOLN
0.1000 ug/kg/h | INTRAVENOUS | Status: DC
Start: 2012-12-11 — End: 2012-12-12
  Administered 2012-12-11: 0.7 ug/kg/h via INTRAVENOUS
  Administered 2012-12-12: 0.3 ug/kg/h via INTRAVENOUS
  Administered 2012-12-12: 0.7 ug/kg/h via INTRAVENOUS
  Filled 2012-12-11 (×3): qty 50

## 2012-12-11 MED ORDER — LACTATED RINGERS IV SOLN
INTRAVENOUS | Status: DC | PRN
  Administered 2012-12-11 (×2): via INTRAVENOUS

## 2012-12-11 MED ORDER — BISACODYL 5 MG PO TBEC
10.0000 mg | DELAYED_RELEASE_TABLET | Freq: Every day | ORAL | Status: DC
  Administered 2012-12-13 – 2012-12-15 (×2): 10 mg via ORAL
  Filled 2012-12-11: qty 2

## 2012-12-11 MED ORDER — MORPHINE SULFATE 2 MG/ML IJ SOLN: 1.0000 mg | INTRAMUSCULAR | Status: AC | PRN

## 2012-12-11 MED ORDER — MILRINONE IN DEXTROSE 20 MG/100ML IV SOLN
INTRAVENOUS | Status: DC | PRN
  Administered 2012-12-11: .3 ug/kg/min via INTRAVENOUS

## 2012-12-11 MED ORDER — SODIUM CHLORIDE 0.9 % IJ SOLN
3.0000 mL | Freq: Two times a day (BID) | INTRAMUSCULAR | Status: DC
  Administered 2012-12-12 – 2012-12-19 (×9): 3 mL via INTRAVENOUS

## 2012-12-11 MED ORDER — METOPROLOL TARTRATE 12.5 MG HALF TABLET
12.5000 mg | ORAL_TABLET | Freq: Two times a day (BID) | ORAL | Status: DC
  Administered 2012-12-15 – 2012-12-19 (×7): 12.5 mg via ORAL
  Filled 2012-12-11 (×17): qty 1

## 2012-12-11 MED ORDER — PHENYLEPHRINE HCL 10 MG/ML IJ SOLN
0.0000 ug/min | INTRAVENOUS | Status: DC
  Administered 2012-12-12: 25 ug/min via INTRAVENOUS
  Administered 2012-12-13: 50 ug/min via INTRAVENOUS
  Administered 2012-12-13: 30 ug/min via INTRAVENOUS
  Administered 2012-12-14: 20 ug/min via INTRAVENOUS
  Administered 2012-12-14 – 2012-12-15 (×2): 50 ug/min via INTRAVENOUS
  Administered 2012-12-15 (×2): 60 ug/min via INTRAVENOUS
  Filled 2012-12-11 (×11): qty 2

## 2012-12-11 MED ORDER — LIDOCAINE HCL (CARDIAC) 20 MG/ML IV SOLN
INTRAVENOUS | Status: DC | PRN
  Administered 2012-12-11: 40 mg via INTRAVENOUS

## 2012-12-11 MED ORDER — NOREPINEPHRINE BITARTRATE 1 MG/ML IJ SOLN
4000.0000 ug | INTRAVENOUS | Status: DC | PRN
  Administered 2012-12-11: 2 ug/kg/min via INTRAVENOUS

## 2012-12-11 MED ORDER — AMIODARONE HCL 200 MG PO TABS
200.0000 mg | ORAL_TABLET | Freq: Every day | ORAL | Status: DC
  Filled 2012-12-11: qty 1

## 2012-12-11 MED ORDER — DEXTROSE 5 % IV SOLN
0.7500 g | INTRAVENOUS | Status: DC | PRN
  Administered 2012-12-11: .75 g via INTRAVENOUS

## 2012-12-11 MED ORDER — DOCUSATE SODIUM 100 MG PO CAPS
200.0000 mg | ORAL_CAPSULE | Freq: Every day | ORAL | Status: DC
  Administered 2012-12-13 – 2012-12-17 (×4): 200 mg via ORAL
  Filled 2012-12-11 (×3): qty 2

## 2012-12-11 MED ORDER — METOPROLOL TARTRATE 25 MG/10 ML ORAL SUSPENSION
12.5000 mg | Freq: Two times a day (BID) | ORAL | Status: DC
  Filled 2012-12-11 (×17): qty 5

## 2012-12-11 MED ORDER — PANTOPRAZOLE SODIUM 40 MG PO TBEC
40.0000 mg | DELAYED_RELEASE_TABLET | Freq: Every day | ORAL | Status: DC
  Administered 2012-12-13: 40 mg via ORAL
  Filled 2012-12-11: qty 1

## 2012-12-11 MED ORDER — BISACODYL 10 MG RE SUPP: 10.0000 mg | Freq: Every day | RECTAL | Status: DC

## 2012-12-11 MED ORDER — MORPHINE SULFATE 2 MG/ML IJ SOLN
2.0000 mg | INTRAMUSCULAR | Status: DC | PRN
  Administered 2012-12-12: 2 mg via INTRAVENOUS
  Administered 2012-12-12: 1 mg via INTRAVENOUS
  Administered 2012-12-12 – 2012-12-13 (×3): 2 mg via INTRAVENOUS
  Filled 2012-12-11 (×5): qty 1

## 2012-12-11 MED ORDER — MILRINONE IN DEXTROSE 20 MG/100ML IV SOLN
0.3000 ug/kg/min | INTRAVENOUS | Status: DC
  Administered 2012-12-11 – 2012-12-13 (×3): 0.3 ug/kg/min via INTRAVENOUS
  Filled 2012-12-11 (×2): qty 100

## 2012-12-11 MED ORDER — ACETAMINOPHEN 650 MG RE SUPP
650.0000 mg | Freq: Once | RECTAL | Status: AC
  Administered 2012-12-11: 650 mg via RECTAL

## 2012-12-11 MED ORDER — LACTATED RINGERS IV SOLN: INTRAVENOUS | Status: DC

## 2012-12-11 MED ORDER — NOREPINEPHRINE BITARTRATE 1 MG/ML IJ SOLN
2.0000 ug/min | Freq: Once | INTRAMUSCULAR | Status: DC
  Filled 2012-12-11: qty 4

## 2012-12-11 MED ORDER — LACTATED RINGERS IV SOLN
INTRAVENOUS | Status: DC | PRN
  Administered 2012-12-11 (×2): via INTRAVENOUS

## 2012-12-11 MED ORDER — MILRINONE IN DEXTROSE 20 MG/100ML IV SOLN
0.2500 ug/kg/min | INTRAVENOUS | Status: DC
  Filled 2012-12-11: qty 100

## 2012-12-11 MED ORDER — EPHEDRINE SULFATE 50 MG/ML IJ SOLN
INTRAMUSCULAR | Status: DC | PRN
  Administered 2012-12-11 (×2): 2.5 mg via INTRAVENOUS

## 2012-12-11 MED ORDER — LEVALBUTEROL HCL 1.25 MG/0.5ML IN NEBU
1.2500 mg | INHALATION_SOLUTION | Freq: Four times a day (QID) | RESPIRATORY_TRACT | Status: DC
  Administered 2012-12-12 – 2012-12-23 (×44): 1.25 mg via RESPIRATORY_TRACT
  Filled 2012-12-11 (×53): qty 0.5

## 2012-12-11 MED ORDER — ONDANSETRON HCL 4 MG/2ML IJ SOLN
4.0000 mg | Freq: Four times a day (QID) | INTRAMUSCULAR | Status: DC | PRN
  Administered 2012-12-12 – 2012-12-22 (×5): 4 mg via INTRAVENOUS
  Filled 2012-12-11 (×6): qty 2

## 2012-12-11 MED ORDER — GLYCOPYRROLATE 0.2 MG/ML IJ SOLN
INTRAMUSCULAR | Status: DC | PRN
  Administered 2012-12-11 (×2): 0.2 mg via INTRAVENOUS

## 2012-12-11 MED ORDER — METOPROLOL TARTRATE 1 MG/ML IV SOLN
2.5000 mg | INTRAVENOUS | Status: DC | PRN
  Administered 2012-12-12: 2.5 mg via INTRAVENOUS

## 2012-12-11 MED ORDER — VANCOMYCIN HCL IN DEXTROSE 1-5 GM/200ML-% IV SOLN: 1000.0000 mg | Freq: Once | INTRAVENOUS | Status: DC

## 2012-12-11 MED ORDER — SODIUM CHLORIDE 0.9 % IJ SOLN: 3.0000 mL | INTRAMUSCULAR | Status: DC | PRN

## 2012-12-11 MED ORDER — DEXMEDETOMIDINE HCL IN NACL 400 MCG/100ML IV SOLN
0.4000 ug/kg/h | Freq: Once | INTRAVENOUS | Status: DC
  Filled 2012-12-11: qty 100

## 2012-12-11 MED ORDER — OXYCODONE HCL 5 MG PO TABS
5.0000 mg | ORAL_TABLET | ORAL | Status: DC | PRN
  Administered 2012-12-12 (×2): 5 mg via ORAL
  Administered 2012-12-13: 10 mg via ORAL
  Filled 2012-12-11: qty 2
  Filled 2012-12-11 (×2): qty 1

## 2012-12-11 MED ORDER — POTASSIUM CHLORIDE 10 MEQ/50ML IV SOLN: 10.0000 meq | INTRAVENOUS | Status: AC

## 2012-12-11 MED ORDER — ACETAMINOPHEN 500 MG PO TABS
1000.0000 mg | ORAL_TABLET | Freq: Four times a day (QID) | ORAL | Status: DC
  Administered 2012-12-12 – 2012-12-14 (×8): 1000 mg via ORAL
  Filled 2012-12-11 (×12): qty 2

## 2012-12-11 MED ORDER — VANCOMYCIN HCL IN DEXTROSE 1-5 GM/200ML-% IV SOLN
1000.0000 mg | Freq: Once | INTRAVENOUS | Status: AC
  Administered 2012-12-11: 1000 mg via INTRAVENOUS
  Filled 2012-12-11: qty 200

## 2012-12-11 MED ORDER — EPINEPHRINE HCL 1 MG/ML IJ SOLN
0.5000 ug/min | Freq: Once | INTRAVENOUS | Status: DC
  Filled 2012-12-11: qty 1

## 2012-12-11 MED ORDER — DOPAMINE-DEXTROSE 3.2-5 MG/ML-% IV SOLN
INTRAVENOUS | Status: DC | PRN
  Administered 2012-12-11: 3 ug/kg/min via INTRAVENOUS

## 2012-12-11 MED ORDER — LACTATED RINGERS IV SOLN
INTRAVENOUS | Status: DC | PRN
  Administered 2012-12-11 (×3): via INTRAVENOUS

## 2012-12-11 SURGICAL SUPPLY — 167 items
ADAPTER CARDIO PERF ANTE/RETRO (ADAPTER) ×4 IMPLANT
ADAPTER DLP PERFUSION .25INX2I (MISCELLANEOUS) ×4 IMPLANT
ATTRACTOMAT 16X20 MAGNETIC DRP (DRAPES) ×4 IMPLANT
BAG DECANTER FOR FLEXI CONT (MISCELLANEOUS) ×4 IMPLANT
BANDAGE ELASTIC 4 VELCRO ST LF (GAUZE/BANDAGES/DRESSINGS) ×4 IMPLANT
BANDAGE ELASTIC 6 VELCRO ST LF (GAUZE/BANDAGES/DRESSINGS) ×4 IMPLANT
BANDAGE GAUZE ELAST BULKY 4 IN (GAUZE/BANDAGES/DRESSINGS) ×4 IMPLANT
BANDAGE HEMOSTAT MRDH 4X4 STRL (MISCELLANEOUS) ×2 IMPLANT
BASKET HEART  (ORDER IN 25'S) (MISCELLANEOUS) ×1
BASKET HEART (ORDER IN 25'S) (MISCELLANEOUS) ×1
BASKET HEART (ORDER IN 25S) (MISCELLANEOUS) ×2 IMPLANT
BLADE CORE FAN STRYKER (BLADE) ×4 IMPLANT
BLADE STERNUM SYSTEM 6 (BLADE) ×4 IMPLANT
BLADE SURG 11 STRL SS (BLADE) ×8 IMPLANT
BLADE SURG 12 STRL SS (BLADE) ×4 IMPLANT
BLADE SURG 15 STRL LF DISP TIS (BLADE) ×4 IMPLANT
BLADE SURG 15 STRL SS (BLADE) ×4
BLADE SURG ROTATE 9660 (MISCELLANEOUS) IMPLANT
BNDG HEMOSTAT MRDH 4X4 STRL (MISCELLANEOUS) ×4
CANISTER SUCTION 2500CC (MISCELLANEOUS) ×4 IMPLANT
CANNULA AORTIC HI-FLOW 6.5M20F (CANNULA) ×4 IMPLANT
CANNULA ARTERIAL NVNT 3/8 20FR (MISCELLANEOUS) ×4 IMPLANT
CANNULA FEM VENOUS REMOTE 22FR (CANNULA) ×4 IMPLANT
CANNULA GUNDRY RCSP 15FR (MISCELLANEOUS) ×4 IMPLANT
CANNULA VENOUS LOW PROF 32X40 (CANNULA) ×4 IMPLANT
CANNULA VENOUS LOW PROF 34X46 (CANNULA) ×4 IMPLANT
CANNULA VENOUS MAL SGL STG 40 (MISCELLANEOUS) IMPLANT
CANNULAE VENOUS MAL SGL STG 40 (MISCELLANEOUS)
CARDIAC SUCTION (MISCELLANEOUS) ×4 IMPLANT
CATH CPB KIT VANTRIGT (MISCELLANEOUS) ×4 IMPLANT
CATH HEART VENT LEFT (CATHETERS) ×2 IMPLANT
CATH RETROPLEGIA CORONARY 14FR (CATHETERS) IMPLANT
CATH ROBINSON RED A/P 18FR (CATHETERS) ×16 IMPLANT
CATH THORACIC 28FR (CATHETERS) IMPLANT
CATH THORACIC 28FR RT ANG (CATHETERS) IMPLANT
CATH THORACIC 36FR (CATHETERS) IMPLANT
CATH THORACIC 36FR RT ANG (CATHETERS) ×8 IMPLANT
CATH/SQUID NICHOLS JEHLE COR (CATHETERS) ×4 IMPLANT
CAUTERY EYE LOW TEMP 1300F FIN (OPHTHALMIC RELATED) ×4 IMPLANT
CAUTERY SURG HI TEMP FINE TIP (MISCELLANEOUS) ×12 IMPLANT
CLIP FOGARTY SPRING 6M (CLIP) IMPLANT
CLIP TI WIDE RED SMALL 24 (CLIP) IMPLANT
CONT SPEC STER OR (MISCELLANEOUS) ×8 IMPLANT
COVER SURGICAL LIGHT HANDLE (MISCELLANEOUS) ×4 IMPLANT
CRADLE DONUT ADULT HEAD (MISCELLANEOUS) ×4 IMPLANT
DRAIN CHANNEL 32F RND 10.7 FF (WOUND CARE) ×4 IMPLANT
DRAPE CARDIOVASCULAR INCISE (DRAPES) ×2
DRAPE SLUSH/WARMER DISC (DRAPES) ×4 IMPLANT
DRAPE SRG 135X102X78XABS (DRAPES) ×2 IMPLANT
DRSG AQUACEL AG ADV 3.5X14 (GAUZE/BANDAGES/DRESSINGS) ×4 IMPLANT
ELECT BLADE 4.0 EZ CLEAN MEGAD (MISCELLANEOUS) ×4
ELECT BLADE 6.5 EXT (BLADE) ×4 IMPLANT
ELECT CAUTERY BLADE 6.4 (BLADE) ×4 IMPLANT
ELECT REM PT RETURN 9FT ADLT (ELECTROSURGICAL) ×8
ELECTRODE BLDE 4.0 EZ CLN MEGD (MISCELLANEOUS) ×2 IMPLANT
ELECTRODE REM PT RTRN 9FT ADLT (ELECTROSURGICAL) ×4 IMPLANT
FEMORAL VENOUS CANN RAP (CANNULA) ×8 IMPLANT
GLOVE BIO SURGEON STRL SZ7.5 (GLOVE) ×24 IMPLANT
GOWN STRL NON-REIN LRG LVL3 (GOWN DISPOSABLE) ×32 IMPLANT
GRAFT GELWEAVE VALSALVA 30CM (Prosthesis & Implant Heart) ×4 IMPLANT
GRAFT WOVEN D/V 30DX30L (Vascular Products) ×4 IMPLANT
HANDLE STAPLE ENDO GIA SHORT (STAPLE) ×2
HEMOSTAT POWDER SURGIFOAM 1G (HEMOSTASIS) ×12 IMPLANT
HEMOSTAT SURGICEL 2X14 (HEMOSTASIS) ×4 IMPLANT
INSERT FOGARTY XLG (MISCELLANEOUS) ×4 IMPLANT
KIT BASIN OR (CUSTOM PROCEDURE TRAY) ×4 IMPLANT
KIT DILATOR VASC 18G NDL (KITS) ×4 IMPLANT
KIT ROOM TURNOVER OR (KITS) ×4 IMPLANT
KIT SUCTION CATH 14FR (SUCTIONS) ×4 IMPLANT
KIT VASOVIEW W/TROCAR VH 2000 (KITS) ×4 IMPLANT
LEAD PACING MYOCARDI (MISCELLANEOUS) ×4 IMPLANT
LINE VENT (MISCELLANEOUS) ×4 IMPLANT
LOOP VESSEL MAXI BLUE (MISCELLANEOUS) ×4 IMPLANT
LOOP VESSEL SUPERMAXI WHITE (MISCELLANEOUS) ×4 IMPLANT
MARKER GRAFT CORONARY BYPASS (MISCELLANEOUS) ×12 IMPLANT
NEEDLE AORTIC AIR ASPIRATING (NEEDLE) ×4 IMPLANT
NS IRRIG 1000ML POUR BTL (IV SOLUTION) ×24 IMPLANT
PACK CHEST (CUSTOM PROCEDURE TRAY) ×4 IMPLANT
PACK OPEN HEART (CUSTOM PROCEDURE TRAY) ×4 IMPLANT
PAD ARMBOARD 7.5X6 YLW CONV (MISCELLANEOUS) ×8 IMPLANT
PAD ELECT DEFIB RADIOL ZOLL (MISCELLANEOUS) ×4 IMPLANT
PENCIL BUTTON HOLSTER BLD 10FT (ELECTRODE) ×4 IMPLANT
PUNCH AORTIC ROTATE 4.0MM (MISCELLANEOUS) IMPLANT
PUNCH AORTIC ROTATE 4.5MM 8IN (MISCELLANEOUS) IMPLANT
PUNCH AORTIC ROTATE 5MM 8IN (MISCELLANEOUS) IMPLANT
RELOAD ENDO GIA 30 2.5 (STAPLE) ×4 IMPLANT
RELOAD GIA II 30 3.5 (ENDOMECHANICALS) ×16 IMPLANT
SEALANT SURG COSEAL 8ML (VASCULAR PRODUCTS) ×12 IMPLANT
SENSOR MYOCARDIAL TEMP (MISCELLANEOUS) ×4 IMPLANT
SET CARDIOPLEGIA MPS 5001102 (MISCELLANEOUS) ×4 IMPLANT
SHEATH AVANTI 11CM 5FR (MISCELLANEOUS) ×4 IMPLANT
SPONGE GAUZE 4X4 12PLY (GAUZE/BANDAGES/DRESSINGS) ×8 IMPLANT
SPONGE LAP 18X18 X RAY DECT (DISPOSABLE) ×12 IMPLANT
SPONGE LAP 4X18 X RAY DECT (DISPOSABLE) ×8 IMPLANT
SPONGE TONSIL 1 RF SGL (DISPOSABLE) ×4 IMPLANT
STAPLER ENDO GIA 12MM SHORT (STAPLE) ×2 IMPLANT
STOPCOCK 4 WAY LG BORE MALE ST (IV SETS) ×4 IMPLANT
SUCKER INTRACARDIAC WEIGHTED (SUCKER) ×4 IMPLANT
SUT BONE WAX W31G (SUTURE) ×4 IMPLANT
SUT ETHIBON 2 0 V 52N 30 (SUTURE) ×12 IMPLANT
SUT ETHIBON EXCEL 2-0 V-5 (SUTURE) IMPLANT
SUT ETHIBOND 2 0 SH (SUTURE) ×8 IMPLANT
SUT ETHIBOND 2 0 SH 36X2 (SUTURE) IMPLANT
SUT ETHIBOND 2 0 V4 (SUTURE) IMPLANT
SUT ETHIBOND 2 0V4 GREEN (SUTURE) IMPLANT
SUT ETHIBOND 4 0 RB 1 (SUTURE) IMPLANT
SUT ETHIBOND V-5 VALVE (SUTURE) IMPLANT
SUT ETHILON 3 0 FSL (SUTURE) ×4 IMPLANT
SUT MNCRL AB 4-0 PS2 18 (SUTURE) IMPLANT
SUT PROLENE 2 0 MH 48 (SUTURE) ×8 IMPLANT
SUT PROLENE 3 0 RB 1 (SUTURE) ×4 IMPLANT
SUT PROLENE 3 0 SH 1 (SUTURE) ×16 IMPLANT
SUT PROLENE 3 0 SH DA (SUTURE) IMPLANT
SUT PROLENE 3 0 SH1 36 (SUTURE) ×20 IMPLANT
SUT PROLENE 4 0 RB 1 (SUTURE) ×70
SUT PROLENE 4 0 SH DA (SUTURE) ×80 IMPLANT
SUT PROLENE 4-0 RB1 .5 CRCL 36 (SUTURE) ×70 IMPLANT
SUT PROLENE 5 0 C 1 36 (SUTURE) ×48 IMPLANT
SUT PROLENE 6 0 C 1 30 (SUTURE) ×12 IMPLANT
SUT PROLENE 6 0 CC (SUTURE) ×88 IMPLANT
SUT PROLENE 7 0 DA (SUTURE) IMPLANT
SUT PROLENE 7.0 RB 3 (SUTURE) ×12 IMPLANT
SUT PROLENE 8 0 BV175 6 (SUTURE) IMPLANT
SUT PROLENE BLUE 7 0 (SUTURE) ×4 IMPLANT
SUT SILK  1 MH (SUTURE) ×4
SUT SILK 1 MH (SUTURE) ×4 IMPLANT
SUT SILK 1 TIES 10X30 (SUTURE) ×4 IMPLANT
SUT SILK 2 0 (SUTURE) ×2
SUT SILK 2 0 SH CR/8 (SUTURE) ×12 IMPLANT
SUT SILK 2-0 18XBRD TIE 12 (SUTURE) ×2 IMPLANT
SUT SILK 3 0 SH CR/8 (SUTURE) ×4 IMPLANT
SUT SILK 4 0 (SUTURE) ×2
SUT SILK 4-0 18XBRD TIE 12 (SUTURE) ×2 IMPLANT
SUT STEEL 6MS V (SUTURE) ×8 IMPLANT
SUT STEEL STERNAL CCS#1 18IN (SUTURE) ×4 IMPLANT
SUT STEEL SZ 6 DBL 3X14 BALL (SUTURE) ×4 IMPLANT
SUT TEM PAC WIRE 2 0 SH (SUTURE) ×16 IMPLANT
SUT VIC AB 1 CTX 18 (SUTURE) ×12 IMPLANT
SUT VIC AB 1 CTX 36 (SUTURE) ×6
SUT VIC AB 1 CTX36XBRD ANBCTR (SUTURE) ×6 IMPLANT
SUT VIC AB 2-0 CT1 27 (SUTURE) ×2
SUT VIC AB 2-0 CT1 TAPERPNT 27 (SUTURE) ×2 IMPLANT
SUT VIC AB 2-0 CTX 27 (SUTURE) ×12 IMPLANT
SUT VIC AB 3-0 SH 27 (SUTURE)
SUT VIC AB 3-0 SH 27X BRD (SUTURE) IMPLANT
SUT VIC AB 3-0 X1 27 (SUTURE) ×4 IMPLANT
SUT VICRYL 4-0 PS2 18IN ABS (SUTURE) IMPLANT
SUTURE E-PAK OPEN HEART (SUTURE) ×4 IMPLANT
SYR 10ML KIT SKIN ADHESIVE (MISCELLANEOUS) IMPLANT
SYR 20CC LL (SYRINGE) ×4 IMPLANT
SYRINGE 10CC LL (SYRINGE) ×4 IMPLANT
SYSTEM SAHARA CHEST DRAIN ATS (WOUND CARE) ×4 IMPLANT
TAPE CLOTH SURG 4X10 WHT LF (GAUZE/BANDAGES/DRESSINGS) ×8 IMPLANT
TOWEL OR 17X24 6PK STRL BLUE (TOWEL DISPOSABLE) ×8 IMPLANT
TOWEL OR 17X26 10 PK STRL BLUE (TOWEL DISPOSABLE) ×8 IMPLANT
TRAY CATH LUMEN 1 20CM STRL (SET/KITS/TRAYS/PACK) ×4 IMPLANT
TRAY FOLEY IC TEMP SENS 14FR (CATHETERS) ×4 IMPLANT
TUBE CONNECTING 12'X1/4 (SUCTIONS) ×1
TUBE CONNECTING 12X1/4 (SUCTIONS) ×3 IMPLANT
TUBING ART PRESS 48 MALE/FEM (TUBING) ×8 IMPLANT
TUBING INSUFFLATION 10FT LAP (TUBING) ×4 IMPLANT
UNDERPAD 30X30 INCONTINENT (UNDERPADS AND DIAPERS) ×4 IMPLANT
VALVE MAGNA EASE AORTIC 27MM (Prosthesis & Implant Heart) ×4 IMPLANT
VENT LEFT HEART 12002 (CATHETERS) ×4
WATER STERILE IRR 1000ML POUR (IV SOLUTION) ×8 IMPLANT
YANKAUER SUCT BULB TIP NO VENT (SUCTIONS) ×8 IMPLANT
vascutek ×4 IMPLANT

## 2012-12-11 NOTE — Progress Notes (Signed)
The patient was examined and preop studies reviewed. There has been no change from the prior exam and the patient is ready for surgery.   Plan redo Bentall on [address] [place] for aortic root aneurysm

## 2012-12-11 NOTE — Anesthesia Postprocedure Evaluation (Signed)
  Anesthesia Post-op Note  Patient: Gregory Turner  Procedure(s) Performed: Procedure(s) with comments: REDO STERNOTOMY (N/A) - CIRC ARREST  AXILLARY CANNULATION REDO BENTALL PROCEDURE (N/A) AXILLARY CANNULATION  (N/A) INTRAOPERATIVE TRANSESOPHAGEAL ECHOCARDIOGRAM (N/A)  Patient Location: SICU  Anesthesia Type:General  Level of Consciousness: sedated and Patient remains intubated per anesthesia plan  Airway and Oxygen Therapy: Patient remains intubated per anesthesia plan and Patient placed on Ventilator (see vital sign flow sheet for setting)  Post-op Pain: none  Post-op Assessment: Post-op Vital signs reviewed, Patient's Cardiovascular Status Stable, Respiratory Function Stable, Patent Airway, No signs of Nausea or vomiting and Pain level controlled  Post-op Vital Signs: Reviewed and stable  Complications: No apparent anesthesia complications

## 2012-12-11 NOTE — Transfer of Care (Signed)
Immediate Anesthesia Transfer of Care Note  Patient: Gregory Turner  Procedure(s) Performed: Procedure(s) with comments: REDO STERNOTOMY (N/A) - CIRC ARREST  AXILLARY CANNULATION REDO BENTALL PROCEDURE (N/A) AXILLARY CANNULATION  (N/A) INTRAOPERATIVE TRANSESOPHAGEAL ECHOCARDIOGRAM (N/A)  Patient Location: SICU  Anesthesia Type:General  Level of Consciousness: sedated and Patient remains intubated per anesthesia plan  Airway & Oxygen Therapy: Patient remains intubated per anesthesia plan and Patient placed on Ventilator (see vital sign flow sheet for setting)  Post-op Assessment: Report given to PACU RN and Post -op Vital signs reviewed and stable  Post vital signs: Reviewed and stable  Complications: No apparent anesthesia complications

## 2012-12-11 NOTE — Brief Op Note (Addendum)
      [address]       [place] [address]             [phone]     12/08/2012 - 12/11/2012  4:07 PM  PATIENT:  Gregory Turner  72 y.o. male  PRE-OPERATIVE DIAGNOSIS:ASCENDING AORTIC ANEURYSM --  ROOT ANEURYSM  POST-OPERATIVE DIAGNOSIS: SAME  PROCEDURE:  Procedure(s): REDO STERNOTOMY REDO AVR WITH BIO-BENTALL ROOT REPLACEMENT WITH #27 MAGNAEASE AV /# HEMASHIELD GRAFT WIT REIMPLANTATION OF CORONARY OSTIA HYPOTHERMIC CIRCULATORY ARREST WITH ANTEGRADE CEREBRAL PERFUSION AXILLARY CANNULATION  INTRAOPERATIVE TRANSESOPHAGEAL ECHOCARDIOGRAM  SURGEON:  Surgeon(s): Kerin Perna, MD  PHYSICIAN ASSISTANT: WAYNE GOLD PA-C  ANESTHESIA:   general  PATIENT CONDITION:  ICU - intubated and hemodynamically stable.  PRE-OPERATIVE WEIGHT: 87kg  COMPLICATIONS: NO KNOWN

## 2012-12-11 NOTE — Anesthesia Preprocedure Evaluation (Addendum)
Anesthesia Evaluation  Patient identified by MRN, date of birth, ID band Patient awake    Reviewed: Allergy & Precautions, H&P , NPO status , Patient's Chart, lab work & pertinent test results  Airway Mallampati: II TM Distance: >3 FB Neck ROM: Full    Dental  (+) Teeth Intact and Dental Advisory Given   Pulmonary Current Smoker,  breath sounds clear to auscultation        Cardiovascular hypertension, Rhythm:Regular Rate:Normal     Neuro/Psych    GI/Hepatic   Endo/Other    Renal/GU      Musculoskeletal   Abdominal   Peds  Hematology   Anesthesia Other Findings   Reproductive/Obstetrics                          Anesthesia Physical Anesthesia Plan  ASA: III  Anesthesia Plan: General   Post-op Pain Management:    Induction: Intravenous  Airway Management Planned: Oral ETT  Additional Equipment: Arterial line, PA Cath, 3D TEE and CVP  Intra-op Plan:   Post-operative Plan: Extubation in OR  Informed Consent: I have reviewed the patients History and Physical, chart, labs and discussed the procedure including the risks, benefits and alternatives for the proposed anesthesia with the patient or authorized representative who has indicated his/her understanding and acceptance.   Dental advisory given  Plan Discussed with: CRNA and Anesthesiologist  Anesthesia Plan Comments:         Anesthesia Quick Evaluation

## 2012-12-11 NOTE — Preoperative (Signed)
Beta Blockers   Reason not to administer Beta Blockers:Not Applicable 

## 2012-12-11 NOTE — Anesthesia Procedure Notes (Addendum)
Procedure Name: Intubation Date/Time: 12/11/2012 7:55 AM Performed by: Whitman Hero Pre-anesthesia Checklist: Patient identified, Emergency Drugs available, Suction available and Patient being monitored Patient Re-evaluated:Patient Re-evaluated prior to inductionOxygen Delivery Method: Circle system utilized Preoxygenation: Pre-oxygenation with 100% oxygen Intubation Type: IV induction Ventilation: Mask ventilation without difficulty and Oral airway inserted - appropriate to patient size Laryngoscope Size: Mac and 4 Grade View: Grade I Tube type: Oral Tube size: 8.0 mm Number of attempts: 1 Airway Equipment and Method: Stylet Placement Confirmation: ETT inserted through vocal cords under direct vision,  positive ETCO2 and breath sounds checked- equal and bilateral Secured at: 23 cm Tube secured with: Tape Dental Injury: Teeth and Oropharynx as per pre-operative assessment     The patient was identified and consent obtained.  TO was performed, and full barrier precautions were used.  The skin was anesthetized with lidocaine.  Once the vein was located with the 22 ga. needle using ultrasound guidance , the wire was inserted into the vein.  The wire location was confirmed with ultrasound.  The insertion site was dilated and the introducer was carefully inserted and sutured in place. The PAC was checked, and floated into the PA.  Once in the PA, the catheter was secured. The patient tolerated the procedure well.  CXR was ordered for PACU. Start: 0645 End: 0657 J. Claybon Jabs, MD

## 2012-12-11 NOTE — Progress Notes (Signed)
  Echocardiogram Echocardiogram Transesophageal has been performed.  Gregory Turner 12/11/2012, 11:08 AM

## 2012-12-12 ENCOUNTER — Encounter: Payer: Self-pay | Admitting: Cardiovascular Disease

## 2012-12-12 ENCOUNTER — Inpatient Hospital Stay (HOSPITAL_COMMUNITY): Payer: Medicare PPO

## 2012-12-12 LAB — TYPE AND SCREEN
ABO/RH(D): B POS
Antibody Screen: NEGATIVE
Unit division: 0
Unit division: 0
Unit division: 0
Unit division: 0
Unit division: 0
Unit division: 0
Unit division: 0
Unit division: 0
Unit division: 0
Unit division: 0
Unit division: 0
Unit division: 0
Unit division: 0
Unit division: 0
Unit division: 0
Unit division: 0

## 2012-12-12 LAB — PREPARE PLATELET PHERESIS
Unit division: 0
Unit division: 0
Unit division: 0

## 2012-12-12 LAB — GLUCOSE, CAPILLARY
Glucose-Capillary: 109 mg/dL — ABNORMAL HIGH (ref 70–99)
Glucose-Capillary: 109 mg/dL — ABNORMAL HIGH (ref 70–99)
Glucose-Capillary: 110 mg/dL — ABNORMAL HIGH (ref 70–99)
Glucose-Capillary: 120 mg/dL — ABNORMAL HIGH (ref 70–99)
Glucose-Capillary: 137 mg/dL — ABNORMAL HIGH (ref 70–99)
Glucose-Capillary: 139 mg/dL — ABNORMAL HIGH (ref 70–99)
Glucose-Capillary: 142 mg/dL — ABNORMAL HIGH (ref 70–99)
Glucose-Capillary: 144 mg/dL — ABNORMAL HIGH (ref 70–99)
Glucose-Capillary: 151 mg/dL — ABNORMAL HIGH (ref 70–99)
Glucose-Capillary: 158 mg/dL — ABNORMAL HIGH (ref 70–99)
Glucose-Capillary: 160 mg/dL — ABNORMAL HIGH (ref 70–99)
Glucose-Capillary: 162 mg/dL — ABNORMAL HIGH (ref 70–99)
Glucose-Capillary: 171 mg/dL — ABNORMAL HIGH (ref 70–99)
Glucose-Capillary: 173 mg/dL — ABNORMAL HIGH (ref 70–99)
Glucose-Capillary: 174 mg/dL — ABNORMAL HIGH (ref 70–99)
Glucose-Capillary: 213 mg/dL — ABNORMAL HIGH (ref 70–99)
Glucose-Capillary: 71 mg/dL (ref 70–99)
Glucose-Capillary: 78 mg/dL (ref 70–99)
Glucose-Capillary: 94 mg/dL (ref 70–99)
Glucose-Capillary: 95 mg/dL (ref 70–99)

## 2012-12-12 LAB — PREPARE FRESH FROZEN PLASMA

## 2012-12-12 LAB — POCT I-STAT, CHEM 8
BUN: 17 mg/dL (ref 6–23)
Calcium, Ion: 1.29 mmol/L (ref 1.13–1.30)
Chloride: 103 mEq/L (ref 96–112)
Creatinine, Ser: 1.3 mg/dL (ref 0.50–1.35)
Glucose, Bld: 198 mg/dL — ABNORMAL HIGH (ref 70–99)
HCT: 29 % — ABNORMAL LOW (ref 39.0–52.0)
Hemoglobin: 9.9 g/dL — ABNORMAL LOW (ref 13.0–17.0)
Potassium: 3.8 mEq/L (ref 3.5–5.1)
Sodium: 141 mEq/L (ref 135–145)
TCO2: 23 mmol/L (ref 0–100)

## 2012-12-12 LAB — POCT I-STAT 3, ART BLOOD GAS (G3+)
Acid-base deficit: 1 mmol/L (ref 0.0–2.0)
Bicarbonate: 23.3 mEq/L (ref 20.0–24.0)
Bicarbonate: 25 mEq/L — ABNORMAL HIGH (ref 20.0–24.0)
Bicarbonate: 24.1 mEq/L — ABNORMAL HIGH (ref 20.0–24.0)
O2 Saturation: 98 %
O2 Saturation: 95 %
O2 Saturation: 97 %
Patient temperature: 36.8
Patient temperature: 36.6
Patient temperature: 36.6
TCO2: 24 mmol/L (ref 0–100)
TCO2: 26 mmol/L (ref 0–100)
TCO2: 25 mmol/L (ref 0–100)
pCO2 arterial: 36.6 mmHg (ref 35.0–45.0)
pCO2 arterial: 42.5 mmHg (ref 35.0–45.0)
pCO2 arterial: 36.2 mmHg (ref 35.0–45.0)
pH, Arterial: 7.411 (ref 7.350–7.450)
pH, Arterial: 7.376 (ref 7.350–7.450)
pH, Arterial: 7.431 (ref 7.350–7.450)
pO2, Arterial: 101 mmHg — ABNORMAL HIGH (ref 80.0–100.0)
pO2, Arterial: 78 mmHg — ABNORMAL LOW (ref 80.0–100.0)
pO2, Arterial: 82 mmHg (ref 80.0–100.0)

## 2012-12-12 LAB — CBC
HCT: 36.6 % — ABNORMAL LOW (ref 39.0–52.0)
HCT: 30.2 % — ABNORMAL LOW (ref 39.0–52.0)
Hemoglobin: 12.9 g/dL — ABNORMAL LOW (ref 13.0–17.0)
Hemoglobin: 10.6 g/dL — ABNORMAL LOW (ref 13.0–17.0)
MCH: 30.8 pg (ref 26.0–34.0)
MCHC: 35.2 g/dL (ref 30.0–36.0)
MCHC: 35.1 g/dL (ref 30.0–36.0)
MCV: 87.4 fL (ref 78.0–100.0)
Platelets: 50 10*3/uL — ABNORMAL LOW (ref 150–400)
RBC: 4.19 MIL/uL — ABNORMAL LOW (ref 4.22–5.81)
RDW: 16.4 % — ABNORMAL HIGH (ref 11.5–15.5)
RDW: 16.8 % — ABNORMAL HIGH (ref 11.5–15.5)
WBC: 15.6 10*3/uL — ABNORMAL HIGH (ref 4.0–10.5)
WBC: 12.2 10*3/uL — ABNORMAL HIGH (ref 4.0–10.5)

## 2012-12-12 LAB — BLOOD PRODUCT ORDER (VERBAL) VERIFICATION

## 2012-12-12 LAB — BASIC METABOLIC PANEL
BUN: 15 mg/dL (ref 6–23)
CO2: 25 mEq/L (ref 19–32)
Calcium: 9.5 mg/dL (ref 8.4–10.5)
Chloride: 108 mEq/L (ref 96–112)
Creatinine, Ser: 1.15 mg/dL (ref 0.50–1.35)
Glucose, Bld: 107 mg/dL — ABNORMAL HIGH (ref 70–99)
Sodium: 141 mEq/L (ref 135–145)

## 2012-12-12 LAB — PREPARE CRYOPRECIPITATE
Unit division: 0
Unit division: 0

## 2012-12-12 LAB — MAGNESIUM
Magnesium: 2.6 mg/dL — ABNORMAL HIGH (ref 1.5–2.5)
Magnesium: 1.9 mg/dL (ref 1.5–2.5)

## 2012-12-12 LAB — HEPARIN LEVEL (UNFRACTIONATED): Heparin Unfractionated: <0.1 IU/mL — ABNORMAL LOW (ref 0.30–0.70)

## 2012-12-12 LAB — CREATININE, SERUM
GFR calc Af Amer: 73 mL/min — ABNORMAL LOW (ref >90)
GFR calc non Af Amer: 63 mL/min — ABNORMAL LOW (ref >90)

## 2012-12-12 MED ORDER — POTASSIUM CHLORIDE 10 MEQ/50ML IV SOLN
10.0000 meq | INTRAVENOUS | Status: AC
  Administered 2012-12-12: 10 meq via INTRAVENOUS

## 2012-12-12 MED ORDER — FUROSEMIDE 10 MG/ML IJ SOLN
20.0000 mg | Freq: Once | INTRAMUSCULAR | Status: AC
  Administered 2012-12-12: 20 mg via INTRAVENOUS

## 2012-12-12 MED ORDER — INSULIN ASPART 100 UNIT/ML ~~LOC~~ SOLN
0.0000 [IU] | SUBCUTANEOUS | Status: DC
  Administered 2012-12-12: 2 [IU] via SUBCUTANEOUS
  Administered 2012-12-12 (×2): 4 [IU] via SUBCUTANEOUS
  Administered 2012-12-13 – 2012-12-16 (×11): 2 [IU] via SUBCUTANEOUS
  Administered 2012-12-17 (×2): 4 [IU] via SUBCUTANEOUS

## 2012-12-12 MED ORDER — FUROSEMIDE 10 MG/ML IJ SOLN
6.0000 mg/h | INTRAVENOUS | Status: DC
  Administered 2012-12-12: 6 mg/h via INTRAVENOUS
  Filled 2012-12-12: qty 25

## 2012-12-12 MED ORDER — AMIODARONE HCL IN DEXTROSE 360-4.14 MG/200ML-% IV SOLN
60.0000 mg/h | INTRAVENOUS | Status: AC
  Administered 2012-12-12: 60 mg/h via INTRAVENOUS
  Filled 2012-12-12: qty 200

## 2012-12-12 MED ORDER — INSULIN DETEMIR 100 UNIT/ML ~~LOC~~ SOLN
10.0000 [IU] | Freq: Two times a day (BID) | SUBCUTANEOUS | Status: DC
  Administered 2012-12-12: 10 [IU] via SUBCUTANEOUS
  Filled 2012-12-12 (×2): qty 0.1

## 2012-12-12 MED ORDER — INSULIN DETEMIR 100 UNIT/ML ~~LOC~~ SOLN: 10.0000 [IU] | Freq: Two times a day (BID) | SUBCUTANEOUS | Status: DC

## 2012-12-12 MED ORDER — POTASSIUM CHLORIDE 10 MEQ/50ML IV SOLN
10.0000 meq | INTRAVENOUS | Status: AC
  Administered 2012-12-12 (×2): 10 meq via INTRAVENOUS

## 2012-12-12 MED ORDER — AMIODARONE HCL IN DEXTROSE 360-4.14 MG/200ML-% IV SOLN
30.0000 mg/h | INTRAVENOUS | Status: DC
  Administered 2012-12-12 – 2012-12-17 (×11): 30 mg/h via INTRAVENOUS
  Filled 2012-12-12 (×26): qty 200

## 2012-12-12 MED ORDER — INSULIN DETEMIR 100 UNIT/ML ~~LOC~~ SOLN
14.0000 [IU] | Freq: Two times a day (BID) | SUBCUTANEOUS | Status: DC
  Administered 2012-12-12 – 2012-12-13 (×3): 14 [IU] via SUBCUTANEOUS
  Filled 2012-12-12 (×5): qty 0.14

## 2012-12-12 MED ORDER — NICOTINE 14 MG/24HR TD PT24
14.0000 mg | MEDICATED_PATCH | Freq: Every day | TRANSDERMAL | Status: DC
  Administered 2012-12-12 – 2012-12-26 (×14): 14 mg via TRANSDERMAL
  Filled 2012-12-12 (×17): qty 1

## 2012-12-12 MED FILL — Magnesium Sulfate Inj 50%: INTRAMUSCULAR | Qty: 10 | Status: AC

## 2012-12-12 MED FILL — Heparin Sodium (Porcine) Inj 1000 Unit/ML: INTRAMUSCULAR | Qty: 30 | Status: AC

## 2012-12-12 MED FILL — Potassium Chloride Inj 2 mEq/ML: INTRAVENOUS | Qty: 40 | Status: AC

## 2012-12-12 MED FILL — Dexmedetomidine HCl IV Soln 200 MCG/2ML: INTRAVENOUS | Qty: 2 | Status: AC

## 2012-12-12 MED FILL — Sodium Chloride IV Soln 0.9%: INTRAVENOUS | Qty: 1000 | Status: AC

## 2012-12-12 NOTE — Addendum Note (Signed)
Addendum created 12/12/12 0920 by Edmonia Caprio, CRNA   Modules edited: Anesthesia Medication Administration

## 2012-12-12 NOTE — Progress Notes (Signed)
Anesthesiology Follow-up:  Awake and alert, neuro intact, hemodynamically stable.  VS: T-36.5 BP-107/52 HR- 80 (a-paced) RR 22 O2 Sat 97% on 4L PA 28/17 CO/CI 4.9/2.4  Milrinone:  0.3 mcg/kg/min Dopamine 3 mcg/kg/min Phenylephrine 20 mcg/min    K-4.0 glucose 109 BUN/Cr. 15/1.15 H/H-12.9/36.6 Plts 50,000 PT/INR 12.5/0.95  Extubated today at 76:65  72 year old male one day S/P redo sternotomy for AVR and aortic root replacement, complicated by coagulopathy and bleeding post-op. Now doing well.  Kipp Brood, MD

## 2012-12-12 NOTE — Progress Notes (Signed)
POD # 1 redo Bentall  Alert, c/o being thirsty, pain well controlled  BP 98/58  Pulse 79  Temp(Src) 97.5 F (36.4 C) (Core (Comment))  Resp 18  Ht 5\' 10"  (1.778 m)  Wt 210 lb 12.2 oz (95.6 kg)  BMI 30.24 kg/m2  SpO2 94% PA 35/17 CO= 7.25  Intake/Output Summary (Last 24 hours) at 12/12/12 1818 Last data filed at 12/12/12 1812  Gross per 24 hour  Intake 7471.31 ml  Output   4900 ml  Net 2571.31 ml   On dopamine and milrinone  Doing well POD # 1  Continue present care

## 2012-12-12 NOTE — Addendum Note (Signed)
Addendum created 12/12/12 1225 by Edmonia Caprio, CRNA   Modules edited: Anesthesia Medication Administration

## 2012-12-12 NOTE — Procedures (Signed)
**Note De-Identified Kyung Muto Obfuscation** Extubation Procedure Note  Patient Details:   Name: Gregory Turner DOB: 1941-01-07 MRN: [id number]   Airway Documentation:  Airway 23 mm (Active)  Secured at (cm) 20 cm 12/12/2012  7:55 AM  Measured From Lips 12/12/2012  7:55 AM  Secured Location Center 12/12/2012  7:55 AM  Secured By [employer] 12/12/2012  7:55 AM  Tube Holder Repositioned Yes 12/12/2012  7:55 AM  Cuff Pressure (cm H2O) 20 cm H2O 12/11/2012  7:40 PM  Site Condition Dry 12/12/2012  7:55 AM    Evaluation  O2 sats: stable throughout Complications: No apparent complications Patient did tolerate procedure well. Bilateral Breath Sounds: Clear Suctioning: Airway Yes + leak, no stridor noted, VC and NIF WNL Jendayi Berling, Megan Salon 12/12/2012, 9:04 AM

## 2012-12-12 NOTE — Progress Notes (Signed)
UR Completed.  Gregory Turner [phone] 12/12/2012

## 2012-12-12 NOTE — Addendum Note (Signed)
Addendum created 12/12/12 1356 by Coralee Rud, CRNA   Modules edited: Anesthesia Medication Administration

## 2012-12-12 NOTE — CV Procedure (Signed)
Intra-operative Transesophageal Echocardiography Report:  [name] is a 72 year old male with a history of aortic valve replacement in 1985. He subsequently developed atrial fibrillation and was  found to have an ascending aortic aneurysm of 5.3 cm.  He is now scheduled to undergo elective ascending aortic replacement and possible aortic root and aortic valve replacement by Dr. Donata Clay. Intraoperative transesophageal echocardiography was requested to evaluate the aorta, evaluate the function of the aortic valve, to assess the right and left ventricular function, and to serve as a monitor for intraoperative volume status and air.  The patient was brought to the operating room at [facility] and general anesthesia was induced without difficulty.Following endotracheal intubation and orogastric suctioning, the transesophageal echocardiography probe was inserted into the esophagus without difficulty.  Impression: Pre-bypass findings:  1. Aortic valve: There was a  mechanical prosthesis in the aortic position. I was unable to determine if this was a bileaflet or single leaflet prosthesis. There was a trace amount of aortic insufficiency. There was no perivalvular leak. There were small excrescences present adherent to the valve.   2. Mitral valve: The mitral valve leaflets opened normally there was trace mitral insufficiency. There was mild billowing of the mitral leaflets along the coaptation line but no flail or prolapsing segments noted.  3. Left ventricle: There was normal left ventricular size. There was mild to moderate concentric left ventricular hypertrophy. Left ventricular wall thickness measured 1.05-1.1 cm at the mid-papillary level in the transgastric short axis view. The left ventricular end-diastolic diameter at the mid-chordal  was 3.78 cm. Left ventricular ejection fraction was estimated at 55-60%. There were no regional wall motion abnormalities appreciated.  4. Right  ventricle: The right ventricular cavity was of normal size and appeared to be normal contractility the right ventricular free wall.  5. Tricuspid valve: Tricuspid valve appeared structurally normal appearing with 1+ tricuspid insufficiency.  6. Interatrial septum: The interatrial septum was intact without evidence of patent foramen ovale or atrial septal defect by color Doppler or bubble study.  7. Left atrium: There was no thrombus noted in the left atrium or left atrial appendage.  8. Ascending  Aorta: The ascending aorta was markedly dilated. The aortic root and sinuses of Valsalva were effaced. The aortic annulus at the valve insertion point was 2.9 cm. The proximal aortic root at the approximate sinotubular ridge was 4.9 cm. The proximal ascending aorta in the area where the right main pulmonary artery passes behind the aorta was 5.72 cm. There was moderate thickening of the wall of the ascending aorta.  9. Descending aorta: The descending aorta was dilated and measured 4.01 cm in diameter. There was moderate atheromatous disease noted within the wall the descending aorta.   Post-bypass findings: 1. Aortic valve: There was a bioprosthetic valve in the aortic position. The leaflets appeared to open normally and there was no aortic insufficiency.  2. Mitral Valve: The mitral valve appeared unchanged from the pre-bypass study. There was trace mitral insufficiency with mild billowing of the leaflets  along the coaptation line.  3 Left Ventricle: The left ventricular systolic function appeared normal. There was good contractility in all segments interrogated. The ejection fraction was estimated at 60-65%. There was a small amount of air noted within the left ventricular cavity which resolved over the next 30 minutes following separation from cardiopulmonary bypass. The left ventricle appeared underfilled initially following separation from cardiopulmonary bypass but this was corrected over the  course of the post-bypass period.Marland Kitchen  4. Right Ventricle: The ventricular cavity appeared to be within normal limits of size and there appeared to be normal contractility the right ventricular free wall.  6. Tricuspid valve: There was 1+ tricuspid insufficiency which appeared unchanged from the pre-bypass study.  Kipp Brood, M.D.

## 2012-12-13 ENCOUNTER — Inpatient Hospital Stay (HOSPITAL_COMMUNITY): Payer: Medicare PPO

## 2012-12-13 ENCOUNTER — Encounter (HOSPITAL_COMMUNITY): Payer: Self-pay | Admitting: Cardiothoracic Surgery

## 2012-12-13 DIAGNOSIS — I712 Thoracic aortic aneurysm, without rupture, unspecified: Principal | ICD-10-CM

## 2012-12-13 LAB — GLUCOSE, CAPILLARY
Glucose-Capillary: 131 mg/dL — ABNORMAL HIGH (ref 70–99)
Glucose-Capillary: 124 mg/dL — ABNORMAL HIGH (ref 70–99)
Glucose-Capillary: 125 mg/dL — ABNORMAL HIGH (ref 70–99)
Glucose-Capillary: 130 mg/dL — ABNORMAL HIGH (ref 70–99)

## 2012-12-13 LAB — POCT I-STAT 3, ART BLOOD GAS (G3+)
Bicarbonate: 24.7 mEq/L — ABNORMAL HIGH (ref 20.0–24.0)
Bicarbonate: 25 mEq/L — ABNORMAL HIGH (ref 20.0–24.0)
O2 Saturation: 90 %
O2 Saturation: 94 %
Patient temperature: 37.3
Patient temperature: 98.8
TCO2: 26 mmol/L (ref 0–100)
TCO2: 26 mmol/L (ref 0–100)
pCO2 arterial: 38.8 mmHg (ref 35.0–45.0)
pCO2 arterial: 40.5 mmHg (ref 35.0–45.0)
pH, Arterial: 7.413 (ref 7.350–7.450)
pH, Arterial: 7.399 (ref 7.350–7.450)
pO2, Arterial: 58 mmHg — ABNORMAL LOW (ref 80.0–100.0)
pO2, Arterial: 71 mmHg — ABNORMAL LOW (ref 80.0–100.0)

## 2012-12-13 LAB — CBC
HCT: 30.5 % — ABNORMAL LOW (ref 39.0–52.0)
Hemoglobin: 10.6 g/dL — ABNORMAL LOW (ref 13.0–17.0)
MCH: 31 pg (ref 26.0–34.0)
MCHC: 34.8 g/dL (ref 30.0–36.0)
MCV: 89.2 fL (ref 78.0–100.0)
Platelets: 31 10*3/uL — ABNORMAL LOW (ref 150–400)
RBC: 3.42 MIL/uL — ABNORMAL LOW (ref 4.22–5.81)
RDW: 16.9 % — ABNORMAL HIGH (ref 11.5–15.5)
WBC: 13.1 10*3/uL — ABNORMAL HIGH (ref 4.0–10.5)

## 2012-12-13 LAB — BASIC METABOLIC PANEL
BUN: 19 mg/dL (ref 6–23)
BUN: 21 mg/dL (ref 6–23)
CO2: 25 mEq/L (ref 19–32)
CO2: 25 mEq/L (ref 19–32)
Calcium: 8.2 mg/dL — ABNORMAL LOW (ref 8.4–10.5)
Calcium: 8.1 mg/dL — ABNORMAL LOW (ref 8.4–10.5)
Chloride: 100 mEq/L (ref 96–112)
Chloride: 99 mEq/L (ref 96–112)
Creatinine, Ser: 1.15 mg/dL (ref 0.50–1.35)
Creatinine, Ser: 1.15 mg/dL (ref 0.50–1.35)
GFR calc Af Amer: 72 mL/min — ABNORMAL LOW (ref >90)
GFR calc Af Amer: 72 mL/min — ABNORMAL LOW (ref >90)
GFR calc non Af Amer: 62 mL/min — ABNORMAL LOW (ref >90)
GFR calc non Af Amer: 62 mL/min — ABNORMAL LOW (ref >90)
Glucose, Bld: 136 mg/dL — ABNORMAL HIGH (ref 70–99)
Glucose, Bld: 139 mg/dL — ABNORMAL HIGH (ref 70–99)
Potassium: 3.6 mEq/L (ref 3.5–5.1)
Potassium: 3.6 mEq/L (ref 3.5–5.1)
Sodium: 136 mEq/L (ref 135–145)
Sodium: 137 mEq/L (ref 135–145)

## 2012-12-13 LAB — POCT I-STAT 4, (NA,K, GLUC, HGB,HCT)
Glucose, Bld: 144 mg/dL — ABNORMAL HIGH (ref 70–99)
Glucose, Bld: 139 mg/dL — ABNORMAL HIGH (ref 70–99)
HCT: 30 % — ABNORMAL LOW (ref 39.0–52.0)
HCT: 31 % — ABNORMAL LOW (ref 39.0–52.0)
Hemoglobin: 10.2 g/dL — ABNORMAL LOW (ref 13.0–17.0)
Hemoglobin: 10.5 g/dL — ABNORMAL LOW (ref 13.0–17.0)
Potassium: 3.4 mEq/L — ABNORMAL LOW (ref 3.5–5.1)
Potassium: 3.4 mEq/L — ABNORMAL LOW (ref 3.5–5.1)
Sodium: 140 mEq/L (ref 135–145)
Sodium: 139 mEq/L (ref 135–145)

## 2012-12-13 MED ORDER — METOCLOPRAMIDE HCL 5 MG/ML IJ SOLN
10.0000 mg | Freq: Four times a day (QID) | INTRAMUSCULAR | Status: DC | PRN
  Administered 2012-12-13: 10 mg via INTRAVENOUS
  Filled 2012-12-13: qty 2

## 2012-12-13 MED ORDER — POTASSIUM CHLORIDE 10 MEQ/50ML IV SOLN
10.0000 meq | INTRAVENOUS | Status: AC
  Administered 2012-12-13 (×3): 10 meq via INTRAVENOUS
  Filled 2012-12-13 (×2): qty 50

## 2012-12-13 MED ORDER — SODIUM CHLORIDE 0.9 % IV SOLN
INTRAVENOUS | Status: DC
  Administered 2012-12-17 – 2012-12-18 (×2): via INTRAVENOUS

## 2012-12-13 MED ORDER — THIAMINE HCL 100 MG/ML IJ SOLN
100.0000 mg | Freq: Every day | INTRAMUSCULAR | Status: DC
  Administered 2012-12-14 – 2012-12-18 (×5): 100 mg via INTRAVENOUS
  Administered 2012-12-19: 11:00:00 via INTRAVENOUS
  Administered 2012-12-20: 100 mg via INTRAVENOUS
  Filled 2012-12-13 (×11): qty 1

## 2012-12-13 MED ORDER — FUROSEMIDE 10 MG/ML IJ SOLN
20.0000 mg | Freq: Once | INTRAMUSCULAR | Status: AC
  Administered 2012-12-13: 20 mg via INTRAVENOUS
  Filled 2012-12-13: qty 2

## 2012-12-13 MED ORDER — TRAMADOL HCL 50 MG PO TABS
50.0000 mg | ORAL_TABLET | Freq: Four times a day (QID) | ORAL | Status: DC | PRN
  Administered 2012-12-13 – 2012-12-24 (×6): 50 mg via ORAL
  Filled 2012-12-13 (×5): qty 1

## 2012-12-13 MED ORDER — FUROSEMIDE 10 MG/ML IJ SOLN
8.0000 mg/h | INTRAVENOUS | Status: DC
  Administered 2012-12-14 – 2012-12-15 (×2): 8 mg/h via INTRAVENOUS
  Filled 2012-12-13 (×3): qty 25

## 2012-12-13 MED ORDER — POTASSIUM CHLORIDE 10 MEQ/50ML IV SOLN
10.0000 meq | INTRAVENOUS | Status: AC | PRN
  Administered 2012-12-13 (×3): 10 meq via INTRAVENOUS
  Filled 2012-12-13 (×3): qty 50

## 2012-12-13 MED FILL — Electrolyte-R (PH 7.4) Solution: INTRAVENOUS | Qty: 5000 | Status: AC

## 2012-12-13 MED FILL — Sodium Bicarbonate IV Soln 8.4%: INTRAVENOUS | Qty: 50 | Status: AC

## 2012-12-13 MED FILL — Mannitol IV Soln 20%: INTRAVENOUS | Qty: 500 | Status: AC

## 2012-12-13 MED FILL — Heparin Sodium (Porcine) Inj 1000 Unit/ML: INTRAMUSCULAR | Qty: 10 | Status: AC

## 2012-12-13 MED FILL — Sodium Chloride Irrigation Soln 0.9%: Qty: 3000 | Status: AC

## 2012-12-13 MED FILL — Heparin Sodium (Porcine) Inj 1000 Unit/ML: INTRAMUSCULAR | Qty: 60 | Status: AC

## 2012-12-13 MED FILL — Calcium Chloride Inj 10%: INTRAVENOUS | Qty: 10 | Status: AC

## 2012-12-13 MED FILL — Sodium Chloride IV Soln 0.9%: INTRAVENOUS | Qty: 6000 | Status: AC

## 2012-12-13 NOTE — Progress Notes (Signed)
Inpatient Diabetes Program Recommendations  AACE/ADA: New Consensus Statement on Inpatient Glycemic Control (2013)  Target Ranges:  Prepandial:   less than 140 mg/dL      Peak postprandial:   less than 180 mg/dL (1-2 hours)      Critically ill patients:  140 - 180 mg/dL   Reason for Visit: Results for ERHARD, SENSKE (MRN [id number]) as of 12/13/2012 12:27  Ref. Range 12/12/2012 20:00 12/12/2012 23:59 12/13/2012 04:04 12/13/2012 08:03 12/13/2012 11:59  Glucose-Capillary Latest Range: 70-99 mg/dL 409 (H) 811 (H) 914 (H) 124 (H) 125 (H)   Results for KOREN, SERMERSHEIM (MRN [id number]) as of 12/13/2012 12:27  Ref. Range 12/09/2012 06:05  Hemoglobin A1C Latest Range: <5.7 % 5.4   Note patient will not need Levemir at discharge. Consider decreasing Levemir as patient progresses out of ICU.   Will follow. Beryl Meager, RN, BC-ADM Inpatient Diabetes Coordinator Pager [phone]

## 2012-12-13 NOTE — Op Note (Signed)
NAMETONG, PIECZYNSKI NO.:  [id number]  MEDICAL RECORD NO.:  [id number]  LOCATION:  2S09C                        FACILITY:  [facility]  PHYSICIAN:  Kerin Perna, M.D.  DATE OF BIRTH:  1940-11-06  DATE OF PROCEDURE:  12/11/2012 DATE OF DISCHARGE:                              OPERATIVE REPORT   OPERATIONS: 1. Redo aortic valve replacement with a biologic Bentall root     replacement utilizing a 27-mm Edwards pericardial valve (serial     [id number]) and a 30-mm Gelweave Valsalva graft (serial #[id number]). 2. Resection and grafting of ascending thoracic aortic aneurysm with     hemi-arch reconstruction using a 30-mm Hemashield platinum graft     ([id number]). 3. Hypothermic circulatory arrest with antegrade cerebral perfusion. 4. Placement of left femoral A-line for blood pressure monitoring.  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  INDICATIONS:  The patient is a 72 year old Caucasian male, smoker, who presents with an enlarging root and ascending aortic aneurysm.  He had aortic stenosis and had a mechanical Medtronic Hall valve placed at [facility] in [place], and has been on Coumadin.  Recently, he has had nonspecific chest pains and CTA shows an enlarged aortic root.  A 2D echocardiogram shows the aortic valve to be functioning fairly well with minimal gradient and minimal AI.  LV function by echo was fairly well preserved and coronary arteriograms revealed no significant CAD. Because of the enlarging root aneurysm, the patient is felt to be candidate for  surgical repair.  Prior to surgery, I examined the patient on several occasions in the office and reviewed results of his cardiac catheterizations, 2D echo, and CT scans with the patient and family.  I discussed indications and benefits of resection and grafting of his root and ascending aortic aneurysm.  He understood the benefit would be to reduce his risk for long-term aortic dissection.  We discussed the major aspects of the procedure including the location of the surgical incision, use of probable cardiopulmonary bypass with hypothermic circulatory arrest, and the plan to use a bioprosthetic valve for replacement of his mechanical valve to avoid long-term commitment to Coumadin anticoagulation.  I discussed with him the risks of the operation including risks of bleeding, stroke, MI, infection, and death.  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  After reviewing all these issues, he understood his situation and demonstrated his understanding of our discussion and agreed to proceed with surgery under what I felt was an informed consent.  FINDINGS: 1. Severely enlarged root with dilatation and thinning out of the     sinuses of Valsalva with some ulcerated soft plaque, high risk for     dissection. 2. Severe adhesions of the ascending aorta to the  pulmonary artery and     right ventricular outflow tract 3. Hypothermic circulatory arrest of 44 minutes with antegrade     cerebral perfusion monitored by cerebral pulse oximetry.  OPERATIVE PROCEDURE:  The patient was brought to the operating room and placed supine on the operating table, where general anesthesia was induced under invasive hemodynamic monitoring.  Prior to surgery, I placed a left subclavian triple-lumen catheter for IV access as the patient's peripheral access was poor.  The transesophageal echo probe was placed by the anesthesiologist  which confirmed the preoperative diagnosis of a root aneurysm with fairly normal functioning mechanical valve in the aortic position and fairly well-preserved LV function.  No significant MR was noted.  The patient was then prepped and draped as a sterile field.  A left femoral A-line was placed for blood pressure monitoring and a right femoral vein 5-French mini-introducer was placed for access to the inferior vena cava for cannulation if needed.  Also, a segment of endoscopically harvested vein was obtained from the right thigh in case coronary bypass grafting or extension of the coronary buttons of the root conduit was needed.  An incision was made in the deltopectoral groove on the right, and the retractor was placed after the pectoralis major and minor were divided. The right axillary artery was identified and encircled with vessel loops, and heparin was administered.  The artery was doubly clamped and arteriotomy was made and a Gore-Tex graft - combined cannula was then sewn to the axillary artery.  An 8-mm graft was used.  The clamps were removed and there was good hemostasis at the anastomosis.  A sternal incision was then made, and the sternal wires were removed. The sternum was divided using the oscillating saw with care being taken to avoid injury to the underlying vascular structures.  The anterior mediastinum was carefully dissected.  The anterior surface of the right ventricle, right atrium, ascending aorta were all exposed.  The sternal elevating retractor was used on the left side to further dissect the anterior surface of the heart to the LAD.  The inferior surface of the heart was dissected off the pericardium as well.  Heparin was administered and ACT was documented as being therapeutic.  A pursestring was placed in the right atrium and a venous cannula was placed.  The right axillary arterial inflow and the venous outflow cannulas were then connected to the  cardiopulmonary bypass circuit, and the patient was placed on cardiopulmonary bypass.  I placed a vent via the right superior pulmonary vein, and then finished the rest of the dissection as the patient was being cooled on bypass.  The PA and aorta were densely adherent and the aorta was gently dissected from the PA. The innominate artery was encircled with a vessel loop for later occluding during circulatory arrest.  Cardioplegia cannulas were placed in the ascending aorta and then into the coronary sinus through the right atrium for cardioplegia.  A crossclamp was then placed on the ascending aorta beneath the innominate artery using a large clamp with large inserts.  Cardioplegic arrest was then achieved with infusion of a liter of cardioplegia in split doses between the antegrade aortic and retrograde coronary sinus catheters.  There was good cardioplegic arrest and septal temperature dropped less than 14 degrees.  Cardioplegia was delivered every 20 minutes or less.  A line to insufflate CO2 into the operative field was placed at the top of the surgical incision.  The aorta was divided beneath the crossclamp  and then divided again at the sinotubular junction.  The aorta was extremely dilated, measuring up to 5.7 cm.  Examination of the aortic root demonstrated the sinuses of Valsalva to be extremely dilated and thinned out, especially non-coronary sinus.  It was decided to do a complete root replacement.  The coronary buttons were first developed and then carefully retracted to the side.  The previously placed [facility] valve was then excised from the annulus using a scalpel blade, with care being taken to avoid injury to the underlying ventricular tissue.  The outflow tract was irrigated with copious amounts of saline and it was sized to a 27-mm Magna Ease valve.  At this point, the patient reached 18 degrees and hypothermic circulatory arrest was initiated.  The patient  was exsanguinated to the pump after being placed in deep Trendelenburg.  A clamp was placed on the innominate artery and inflow from the right axillary cannula into the arch vessels was maintained at 500 mL/minute.  There was good back flow through the other arch vessels which was noted after removing the clamp.  The ascending aorta was divided in the proximal arch obliquely at the level of the innominate artery.  A 30-mm Hemashield graft was then sewn end-to-end to this opening using a running 4-0 Prolene initially from the posterior aspect of the anastomosis which was then reinforced with several interrupted 4-0 pledgeted Prolene.  The running Prolene was continued anteriorly to complete the circumference of the anastomosis and this was again reinforced with interrupted 4-0 pledgeted Prolene sutures around the anterior aspect of the anastomosis.  A final layer of CoSeal biologic adhesive was placed over the suture line, and a vascular clamp was placed on the graft just below the anastomosis as flow was resumed to the body and the clamp was removed from the innominate artery.  While the patient was slowly rewarmed, attention was then directed back to the aortic root.  The Bentall conduit was then constructed, placing the Magna Ease valve into the skirt of the Valsalva graft and sewing it in place with three 4-0 Prolene suture lines from commissure to commissure.  Next, subannular pledgets were placed around the annulus, numbering 18 total.  These were 2-0 Ethibond sutures.  The annulus was heavily calcified and very thickened.  Next, the annular sutures were placed through the skirt of the valve- conduit and the valve-conduit was seated and all the sutures were carefully tied.  There was good apposition of the conduit to the annulus.  Cardioplegia was being redosed.  Next, the left main coronary button was sewn to the posterior aspect of the graft using an eye cautery to make an  appropriate size opening away from the leaflets and the left main button was sewn with a running 5-0 Prolene.  This was widely patent and checked from the outside and the inside of the graft.  A final layer of CoSeal medical adhesive was placed around the anastomosis.  Next, the right coronary button was connected to the anterior aspect of the valve conduit using the eye cautery to make appropriate-sized opening and then running 5-0 Prolene to connect the button to the graft material.  This was checked from both the inside and outside of the graft and was widely patent and hemostatic.  Cardioplegia was redosed.  Next, the root graft and the proximal arch graft were then trimmed to the appropriate length and orientations and sewn together in an end-to- end fashion using running 4-0 Prolene.  The vent  was placed in the graft and retrograde warm blood cardioplegia was infused to the coronary sinus to remove any air from the coronaries.  The clamp was then removed and the reconstructed ascending aorta and aortic root was reperfused.  There was minimal bleeding from the suture line at the proximal arch.  There was mild bleeding from the graft-to-graft anastomosis which required 1 or 2 sutures of 4-0 Prolene.  The coronary buttons were inspected and then found to be hemostatic.  There was minimal bleeding from the annular sutures of the Bentall conduit.  The patient was continued to be warmed to 37 degrees.  Temporary pacing wires were applied.  Ventilator was resumed and the lungs were expanded. When the patient was rewarmed and reperfused, he was weaned off cardiopulmonary bypass.  The LV vent had been removed.  The patient initially appeared to be stable and with mild-to-moderate coagulopathy. The platelet count returned low at 70,000 and FFP were ordered and administered.  This improved the coagulation minimally.  There was some bleeding from the proximal suture line at the annulus  posteriorly just to the right of the left main button and this was controlled with a figure-of-eight Prolene suture.  The patient remained hemodynamically stable, but there was still severe diffuse coagulopathy and after platelets and FFP had been administered as well as some cryoprecipitate, it was elected to proceed with a dose of factor VII for severe coagulopathy after redo aortic surgery.  This was administered with improvement in the bleeding.  At this point,  the suture line at the posterior aortic annulus was being Inspected a final time.  The slight traction on the pulmonary artery resulted in a tear and more bleeding.  This was controlled with direct pressure and then fortunately was completely repaired with interrupted 4- 0 Prolene pledgeted sutures.  This area was observed for several minutes to make sure there was no more bleeding.  We did not need to go back on bypass to control the bleeding.The anesthesiologist was asked to pull the  PA catheter back to confirm the catheter was not entrapped by a suture and  he indicated the PA catheter moved freely.  A 2D echo showed excellent biventricular function.  The patient was in a non-paced sinus rhythm.  Arterial blood gases were checked and found to be very satisfactory.  The Gore-Tex graft cannula was then stapled and divided just above the axillary artery.  The incision was irrigated and then closed in layers using Vicryl and skin staples for the skin.  The chest incision was irrigated with saline.  The superior pericardial fat was closed over the aorta.  The anterior and posterior mediastinal chest tube were placed and brought through separate incisions.  The sternum was closed with interrupted steel wire.  The pectoralis fascia was closed with interrupted #1 Vicryl.  The subcutaneous and skin layers were closed in running Vicryl.  The patient remained hemodynamically stable.  The chest tubes were connected to  underwater seal Pleur-Evac drainage system.  Cardiac output and hemodynamics remained very stable on low-dose dopamine and milrinone.  Total cardiopulmonary bypass time was 300 minutes.  Circulatory arrest time was 44 minutes.     Kerin Perna, M.D.     PV/MEDQ  D:  12/12/2012  T:  12/13/2012  Job:  [id number]  cc:   Antonieta Iba, MD

## 2012-12-13 NOTE — Progress Notes (Signed)
2 Days Post-Op Procedure(s) (LRB): REDO STERNOTOMY (N/A) REDO BENTALL PROCEDURE (N/A) AXILLARY CANNULATION  (N/A) INTRAOPERATIVE TRANSESOPHAGEAL ECHOCARDIOGRAM (N/A) Subjective:  a-pacing CI > 3.0 Slight confusion no motor deficit O2 sats low with edema on CXR- lasix drip Low plts 50k>35k  HIT sent  Hb 10.0  Objective: Vital signs in last 24 hours: Temp:  [97.2 F (36.2 C)-99.1 F (37.3 C)] 99 F (37.2 C) (11/26 0756) Pulse Rate:  [65-96] 87 (11/26 0756) Cardiac Rhythm:  [-] Atrial fibrillation (11/26 0740) Resp:  [0-27] 16 (11/26 0756) BP: (72-115)/(39-60) 103/51 mmHg (11/26 0756) SpO2:  [88 %-97 %] 91 % (11/26 0756) Arterial Line BP: (71-134)/(43-91) 93/49 mmHg (11/26 0700) FiO2 (%):  [50 %-100 %] 50 % (11/26 0756) Weight:  [208 lb 1.8 oz (94.4 kg)] 208 lb 1.8 oz (94.4 kg) (11/26 0500)  Hemodynamic parameters for last 24 hours: PAP: (25-45)/(9-23) 34/15 mmHg CO:  [4.4 L/min-7.6 L/min] 7.6 L/min CI:  [2.2 L/min/m2-3.7 L/min/m2] 3.7 L/min/m2  Intake/Output from previous day: 11/25 0701 - 11/26 0700 In: 3887 [P.O.:240; I.V.:2597; IV Piggyback:1050] Out: 3220 [Urine:3050; Chest Tube:170] Intake/Output this shift:    EXAM warm extremities good pulses Scattered rhonchi No neuro deficit abd soft   Lab Results:  Recent Labs  12/12/12 1700  12/13/12 0243 12/13/12 0415  WBC 12.2*  --   --  13.1*  HGB 10.6*  < > 10.2* 10.6*  HCT 30.2*  < > 30.0* 30.5*  PLT 39*  --   --  31*  < > = values in this interval not displayed. BMET:  Recent Labs  12/12/12 0319  12/12/12 1723 12/13/12 0243 12/13/12 0415  NA 141  --  141 140 136  K 4.0  --  3.8 3.4* 3.6  CL 108  --  103  --  100  CO2 25  --   --   --  25  GLUCOSE 107*  --  198* 144* 136*  BUN 15  --  17  --  19  CREATININE 1.15  < > 1.30  --  1.15  CALCIUM 9.5  --   --   --  8.2*  < > = values in this interval not displayed.  PT/INR:  Recent Labs  12/11/12 2000  LABPROT 12.5  INR 0.95   ABG    Component  Value Date/Time   PHART 7.413 12/13/2012 0450   HCO3 24.7* 12/13/2012 0450   TCO2 26 12/13/2012 0450   ACIDBASEDEF 1.0 12/12/2012 0449   O2SAT 90.0 12/13/2012 0450   CBG (last 3)   Recent Labs  12/12/12 2000 12/12/12 2359 12/13/12 0803  GLUCAP 174* 160* 124*    Assessment/Plan: S/P Procedure(s) (LRB): REDO STERNOTOMY (N/A) REDO BENTALL PROCEDURE (N/A) AXILLARY CANNULATION  (N/A) INTRAOPERATIVE TRANSESOPHAGEAL ECHOCARDIOGRAM (N/A) Cont diuresis mobilize OOB Check HIT- no lovenox or ASA until platelets better DC swan- wean drips  LOS: 5 days    VAN TRIGT III,PETER 12/13/2012

## 2012-12-13 NOTE — Progress Notes (Signed)
Pt titrated to 50% VM. O2 sat maintaining around 92%. RT encouraged pt to cough. Pt had weak, but congested cough. Per RT assessment, CPT with flutter valve ordered. RT will continue to monitor.

## 2012-12-13 NOTE — Progress Notes (Signed)
Patient ID: Gregory Turner, male   DOB: [birth year], 72 y.o.   MRN: [id number]   SICU Evening Rounds:   Hemodynamically stable   Urine output good on lasix drip at 8, low dose dopamine CT output low Up in chair CBC    Component Value Date/Time   WBC 13.1* 12/13/2012 0415   WBC 5.9 11/10/2012 1206   RBC 3.42* 12/13/2012 0415   RBC 4.65 11/10/2012 1206   HGB 10.5* 12/13/2012 1319   HCT 31.0* 12/13/2012 1319   PLT 31* 12/13/2012 0415   MCV 89.2 12/13/2012 0415   MCH 31.0 12/13/2012 0415   MCH 32.3 11/10/2012 1206   MCHC 34.8 12/13/2012 0415   MCHC 35.0 11/10/2012 1206   RDW 16.9* 12/13/2012 0415   RDW 13.9 11/10/2012 1206   LYMPHSABS 1.6 11/10/2012 1206   EOSABS 0.1 11/10/2012 1206   BASOSABS 0.1 11/10/2012 1206     BMET    Component Value Date/Time   NA 137 12/13/2012 1600   NA 144 11/10/2012 1206   K 3.6 12/13/2012 1600   CL 99 12/13/2012 1600   CO2 25 12/13/2012 1600   GLUCOSE 139* 12/13/2012 1600   GLUCOSE 74 11/10/2012 1206   BUN 21 12/13/2012 1600   BUN 15 11/10/2012 1206   CREATININE 1.15 12/13/2012 1600   CREATININE 1.20 08/02/2012 0740   CALCIUM 8.1* 12/13/2012 1600   GFRNONAA 62* 12/13/2012 1600   GFRAA 72* 12/13/2012 1600     A/P:  Stable postop course. Continue current plans

## 2012-12-13 NOTE — Progress Notes (Addendum)
An unsuccessful attempt to remove the patient's swan catheter was performed at 1020.  CO and CI prior to attempted removal were 8.21 and 4.0 respectively and the patients PAD was 15 to 18.  Prior to removal attempt the swan was at a level of 55 and the balloon was checked to make sure it was deflated.  Upon attempting to remove swan resistance was met.  Another nurse and the charge nurse were called to assist and both felt resistance when attempting to pull the swan.  Attempt to pull the swan was discontinued and MD notified.  Ernestine Conrad currently at the level of 49.  Patient arterial BP after attempted removal was 128/59 and HR was 96 with no significant changes noted in the patient's rhythm.  Per MD Ernestine Conrad to be left in place, MT tube to be left in place and patient can still be sat up in the chair.  Chem 4 labs at 1300.  Will continue to monitor patient.  PAD pressure line reattached.  Tommi Emery

## 2012-12-14 ENCOUNTER — Inpatient Hospital Stay (HOSPITAL_COMMUNITY): Payer: Medicare PPO

## 2012-12-14 LAB — CARBOXYHEMOGLOBIN
Carboxyhemoglobin: 1.5 % (ref 0.5–1.5)
Methemoglobin: 1.4 % (ref 0.0–1.5)
O2 Saturation: 51.8 %
Total hemoglobin: 11.4 g/dL — ABNORMAL LOW (ref 13.5–18.0)

## 2012-12-14 LAB — COMPREHENSIVE METABOLIC PANEL
ALT: 36 U/L (ref 0–53)
AST: 43 U/L — ABNORMAL HIGH (ref 0–37)
Albumin: 2.6 g/dL — ABNORMAL LOW (ref 3.5–5.2)
Alkaline Phosphatase: 45 U/L (ref 39–117)
BUN: 21 mg/dL (ref 6–23)
CO2: 28 mEq/L (ref 19–32)
Calcium: 7.9 mg/dL — ABNORMAL LOW (ref 8.4–10.5)
Chloride: 101 mEq/L (ref 96–112)
Creatinine, Ser: 1.02 mg/dL (ref 0.50–1.35)
GFR calc Af Amer: 83 mL/min — ABNORMAL LOW (ref >90)
GFR calc non Af Amer: 72 mL/min — ABNORMAL LOW (ref >90)
Glucose, Bld: 114 mg/dL — ABNORMAL HIGH (ref 70–99)
Potassium: 3.1 mEq/L — ABNORMAL LOW (ref 3.5–5.1)
Sodium: 139 mEq/L (ref 135–145)
Total Bilirubin: 0.6 mg/dL (ref 0.3–1.2)
Total Protein: 4.9 g/dL — ABNORMAL LOW (ref 6.0–8.3)

## 2012-12-14 LAB — CBC
HCT: 32.2 % — ABNORMAL LOW (ref 39.0–52.0)
HCT: 31.1 % — ABNORMAL LOW (ref 39.0–52.0)
Hemoglobin: 11.1 g/dL — ABNORMAL LOW (ref 13.0–17.0)
Hemoglobin: 10.8 g/dL — ABNORMAL LOW (ref 13.0–17.0)
MCH: 31 pg (ref 26.0–34.0)
MCH: 31 pg (ref 26.0–34.0)
MCHC: 34.5 g/dL (ref 30.0–36.0)
MCHC: 34.7 g/dL (ref 30.0–36.0)
MCV: 89.9 fL (ref 78.0–100.0)
MCV: 89.4 fL (ref 78.0–100.0)
Platelets: 29 10*3/uL — CL (ref 150–400)
Platelets: 51 10*3/uL — ABNORMAL LOW (ref 150–400)
RBC: 3.58 MIL/uL — ABNORMAL LOW (ref 4.22–5.81)
RBC: 3.48 MIL/uL — ABNORMAL LOW (ref 4.22–5.81)
RDW: 16.9 % — ABNORMAL HIGH (ref 11.5–15.5)
RDW: 17 % — ABNORMAL HIGH (ref 11.5–15.5)
WBC: 10.1 10*3/uL (ref 4.0–10.5)
WBC: 11.1 10*3/uL — ABNORMAL HIGH (ref 4.0–10.5)

## 2012-12-14 LAB — BASIC METABOLIC PANEL
BUN: 23 mg/dL (ref 6–23)
CO2: 30 mEq/L (ref 19–32)
Calcium: 7.9 mg/dL — ABNORMAL LOW (ref 8.4–10.5)
Chloride: 96 mEq/L (ref 96–112)
Creatinine, Ser: 0.98 mg/dL (ref 0.50–1.35)
GFR calc Af Amer: >90 mL/min (ref >90)
GFR calc non Af Amer: 81 mL/min — ABNORMAL LOW (ref >90)
Glucose, Bld: 178 mg/dL — ABNORMAL HIGH (ref 70–99)
Potassium: 3.3 mEq/L — ABNORMAL LOW (ref 3.5–5.1)
Sodium: 135 mEq/L (ref 135–145)

## 2012-12-14 LAB — POCT I-STAT 3, ART BLOOD GAS (G3+)
Acid-Base Excess: 3 mmol/L — ABNORMAL HIGH (ref 0.0–2.0)
Bicarbonate: 27.7 mEq/L — ABNORMAL HIGH (ref 20.0–24.0)
O2 Saturation: 91 %
Patient temperature: 98.6
TCO2: 29 mmol/L (ref 0–100)
pCO2 arterial: 39.8 mmHg (ref 35.0–45.0)
pH, Arterial: 7.45 (ref 7.350–7.450)
pO2, Arterial: 59 mmHg — ABNORMAL LOW (ref 80.0–100.0)

## 2012-12-14 LAB — GLUCOSE, CAPILLARY
Glucose-Capillary: 100 mg/dL — ABNORMAL HIGH (ref 70–99)
Glucose-Capillary: 115 mg/dL — ABNORMAL HIGH (ref 70–99)
Glucose-Capillary: 117 mg/dL — ABNORMAL HIGH (ref 70–99)
Glucose-Capillary: 119 mg/dL — ABNORMAL HIGH (ref 70–99)
Glucose-Capillary: 128 mg/dL — ABNORMAL HIGH (ref 70–99)
Glucose-Capillary: 135 mg/dL — ABNORMAL HIGH (ref 70–99)
Glucose-Capillary: 93 mg/dL (ref 70–99)
Glucose-Capillary: 99 mg/dL (ref 70–99)

## 2012-12-14 MED ORDER — DEXTROSE 5 % IV SOLN
1.0000 g | Freq: Three times a day (TID) | INTRAVENOUS | Status: DC
  Administered 2012-12-14 – 2012-12-23 (×27): 1 g via INTRAVENOUS
  Filled 2012-12-14 (×32): qty 1

## 2012-12-14 MED ORDER — POTASSIUM CHLORIDE 10 MEQ/50ML IV SOLN
10.0000 meq | INTRAVENOUS | Status: AC
  Administered 2012-12-14 (×4): 10 meq via INTRAVENOUS
  Filled 2012-12-14 (×4): qty 50

## 2012-12-14 MED ORDER — INSULIN DETEMIR 100 UNIT/ML ~~LOC~~ SOLN
12.0000 [IU] | Freq: Two times a day (BID) | SUBCUTANEOUS | Status: DC
  Administered 2012-12-14 – 2012-12-17 (×8): 12 [IU] via SUBCUTANEOUS
  Filled 2012-12-14 (×12): qty 0.12

## 2012-12-14 MED ORDER — POTASSIUM CHLORIDE 10 MEQ/50ML IV SOLN
10.0000 meq | INTRAVENOUS | Status: AC
  Administered 2012-12-14 (×4): 10 meq via INTRAVENOUS

## 2012-12-14 MED ORDER — ACETAMINOPHEN 500 MG PO TABS
500.0000 mg | ORAL_TABLET | Freq: Four times a day (QID) | ORAL | Status: DC | PRN
  Administered 2012-12-24: 500 mg via ORAL
  Filled 2012-12-14: qty 1

## 2012-12-14 MED ORDER — FUROSEMIDE 10 MG/ML IJ SOLN
20.0000 mg | Freq: Once | INTRAMUSCULAR | Status: AC
  Administered 2012-12-14: 20 mg via INTRAVENOUS
  Filled 2012-12-14: qty 2

## 2012-12-14 MED ORDER — POTASSIUM CHLORIDE 10 MEQ/50ML IV SOLN
INTRAVENOUS | Status: AC
  Filled 2012-12-14: qty 50

## 2012-12-14 MED ORDER — HYDROCODONE-ACETAMINOPHEN 5-325 MG PO TABS
1.0000 | ORAL_TABLET | ORAL | Status: DC | PRN
  Administered 2012-12-14 – 2012-12-25 (×14): 1 via ORAL
  Filled 2012-12-14 (×16): qty 1

## 2012-12-14 MED ORDER — PANTOPRAZOLE SODIUM 40 MG IV SOLR
40.0000 mg | INTRAVENOUS | Status: DC
  Administered 2012-12-14 – 2012-12-16 (×3): 40 mg via INTRAVENOUS
  Filled 2012-12-14 (×4): qty 40

## 2012-12-14 MED ORDER — VANCOMYCIN HCL IN DEXTROSE 1-5 GM/200ML-% IV SOLN
1000.0000 mg | Freq: Two times a day (BID) | INTRAVENOUS | Status: DC
  Administered 2012-12-14 – 2012-12-16 (×4): 1000 mg via INTRAVENOUS
  Filled 2012-12-14 (×5): qty 200

## 2012-12-14 MED ORDER — POTASSIUM CHLORIDE 10 MEQ/50ML IV SOLN
10.0000 meq | INTRAVENOUS | Status: AC | PRN
  Administered 2012-12-14 (×3): 10 meq via INTRAVENOUS
  Filled 2012-12-14 (×2): qty 50

## 2012-12-14 MED ORDER — POTASSIUM CHLORIDE 10 MEQ/50ML IV SOLN
INTRAVENOUS | Status: AC
  Administered 2012-12-14: 10 meq via INTRAVENOUS
  Filled 2012-12-14: qty 200

## 2012-12-14 MED ORDER — AMIODARONE IV BOLUS ONLY 150 MG/100ML
150.0000 mg | Freq: Once | INTRAVENOUS | Status: AC
  Administered 2012-12-14: 150 mg via INTRAVENOUS

## 2012-12-14 MED ORDER — ENSURE COMPLETE PO LIQD
237.0000 mL | Freq: Three times a day (TID) | ORAL | Status: DC
  Administered 2012-12-14 – 2012-12-17 (×7): 237 mL via ORAL

## 2012-12-14 NOTE — Plan of Care (Signed)
Problem: Phase III Progression Outcomes Goal: Ambulates with pain/dyspnea controlled Outcome: Not Progressing Gregory Turner still in place, pt unable to progress with ambulation in hall

## 2012-12-14 NOTE — Progress Notes (Addendum)
3 Days Post-Op Procedure(s) (LRB): REDO STERNOTOMY (N/A) REDO BENTALL PROCEDURE (N/A) AXILLARY CANNULATION  (N/A) INTRAOPERATIVE TRANSESOPHAGEAL ECHOCARDIOGRAM (N/A) Subjective: Up in chair on nonrebreather mask Mental status better CXR with edema pattern- PA pressures normal Excellent urine output on lasix drip Junctional rhythm on IV amiodarone Objective: Vital signs in last 24 hours: Temp:  [97.2 F (36.2 C)-98.6 F (37 C)] 97.8 F (36.6 C) (11/27 0956) Pulse Rate:  [87-104] 101 (11/27 0900) Cardiac Rhythm:  [-] Junctional rhythm (11/27 0900) Resp:  [18-30] 24 (11/27 0956) BP: (84-111)/(53-71) 88/59 mmHg (11/27 0900) SpO2:  [88 %-100 %] 96 % (11/27 0900) Arterial Line BP: (91-314)/(47-87) 98/54 mmHg (11/27 0956) FiO2 (%):  [100 %] 100 % (11/26 1500) Weight:  [207 lb 3.7 oz (94 kg)] 207 lb 3.7 oz (94 kg) (11/27 0500)  Hemodynamic parameters for last 24 hours: PAP: (17-45)/(4-21) 24/7 mmHg  Intake/Output from previous day: 11/26 0701 - 11/27 0700 In: 2936 [P.O.:240; I.V.:2146; IV Piggyback:550] Out: 4930 [Urine:4840; Chest Tube:90] Intake/Output this shift: Total I/O In: 12.5 [Blood:12.5] Out: 325 [Urine:325]  EXAm  Scattered rales Neuro intact Good pulses PA catheter will not withdraw  Lab Results:  Recent Labs  12/13/12 0415 12/13/12 1319 12/14/12 0315  WBC 13.1*  --  10.1  HGB 10.6* 10.5* 11.1*  HCT 30.5* 31.0* 32.2*  PLT 31*  --  29*   BMET:  Recent Labs  12/13/12 1600 12/14/12 0315  NA 137 139  K 3.6 3.1*  CL 99 101  CO2 25 28  GLUCOSE 139* 114*  BUN 21 21  CREATININE 1.15 1.02  CALCIUM 8.1* 7.9*    PT/INR:  Recent Labs  12/11/12 2000  LABPROT 12.5  INR 0.95   ABG    Component Value Date/Time   PHART 7.450 12/14/2012 0823   HCO3 27.7* 12/14/2012 0823   TCO2 29 12/14/2012 0823   ACIDBASEDEF 1.0 12/12/2012 0449   O2SAT 91.0 12/14/2012 0823   CBG (last 3)   Recent Labs  12/13/12 2345 12/14/12 0430 12/14/12 0754  GLUCAP  119* 115* 93    Assessment/Plan: S/P Procedure(s) (LRB): REDO STERNOTOMY (N/A) REDO BENTALL PROCEDURE (N/A) AXILLARY CANNULATION  (N/A) INTRAOPERATIVE TRANSESOPHAGEAL ECHOCARDIOGRAM (N/A)  Tenuous pulmonary status- cont antibiotics for probable broncopneumonia, diuresis and pulmonary toilette  Severe thrombocytopenia- HIT pending- no heparin in lines  With retained PA cath he couldnot undergo CPB in current condition without extreme risk due to pul status and low platelets and inability to use heparin at this time Will divide catheter under sterile prep in OR ( local anesthesia) and place in subcutaneous pocket for later potential removal,he needs mobility at this point to improve pulmonary status. Procedure and plan of care d/w patient and family   LOS: 6 days    VAN TRIGT III,PETER 12/14/2012

## 2012-12-14 NOTE — Progress Notes (Signed)
CRITICAL VALUE ALERT  Critical value received:  Platelets  Date of notification:  12/14/12  Time of notification:  0352  Critical value read back:yes  Nurse who received alert:  Viviano Simas  MD notified (1st page):  Bartle  Time of first page:  0400  MD notified (2nd page):  Time of second page:  Responding MD:  Laneta Simmers  Time MD responded:  [phone]

## 2012-12-14 NOTE — Plan of Care (Signed)
Problem: Phase II Progression Outcomes Goal: Tolerates weaning with O2 Sat > 90 Outcome: Not Progressing Pt still requires venti mask 50% or NRB to maintain sats > 90% Goal: Tolerates D/C of vasopressors Outcome: Not Progressing Pt still requires Neo-Synepherine to maintain adequate BP

## 2012-12-15 ENCOUNTER — Inpatient Hospital Stay (HOSPITAL_COMMUNITY): Payer: Medicare PPO | Admitting: Anesthesiology

## 2012-12-15 ENCOUNTER — Inpatient Hospital Stay (HOSPITAL_COMMUNITY): Payer: Medicare PPO

## 2012-12-15 ENCOUNTER — Encounter (HOSPITAL_COMMUNITY)
Admission: RE | Disposition: A | Discharge status: Home Health | Payer: Self-pay | Source: Ambulatory Visit | Attending: Cardiothoracic Surgery

## 2012-12-15 ENCOUNTER — Encounter (HOSPITAL_COMMUNITY): Payer: Medicare PPO | Admitting: Anesthesiology

## 2012-12-15 DIAGNOSIS — I712 Thoracic aortic aneurysm, without rupture, unspecified: Secondary | ICD-10-CM

## 2012-12-15 DIAGNOSIS — I359 Nonrheumatic aortic valve disorder, unspecified: Secondary | ICD-10-CM

## 2012-12-15 HISTORY — PX: CENTRAL VENOUS CATHETER INSERTION: SHX401

## 2012-12-15 LAB — PREPARE PLATELET PHERESIS: Unit division: 0

## 2012-12-15 LAB — COMPREHENSIVE METABOLIC PANEL
ALT: 28 U/L (ref 0–53)
AST: 26 U/L (ref 0–37)
Albumin: 2.3 g/dL — ABNORMAL LOW (ref 3.5–5.2)
Alkaline Phosphatase: 65 U/L (ref 39–117)
BUN: 22 mg/dL (ref 6–23)
CO2: 33 mEq/L — ABNORMAL HIGH (ref 19–32)
Calcium: 8 mg/dL — ABNORMAL LOW (ref 8.4–10.5)
Chloride: 95 mEq/L — ABNORMAL LOW (ref 96–112)
Creatinine, Ser: 1.01 mg/dL (ref 0.50–1.35)
GFR calc Af Amer: 84 mL/min — ABNORMAL LOW (ref >90)
GFR calc non Af Amer: 73 mL/min — ABNORMAL LOW (ref >90)
Glucose, Bld: 126 mg/dL — ABNORMAL HIGH (ref 70–99)
Potassium: 3.3 mEq/L — ABNORMAL LOW (ref 3.5–5.1)
Sodium: 135 mEq/L (ref 135–145)
Total Bilirubin: 0.5 mg/dL (ref 0.3–1.2)
Total Protein: 5.1 g/dL — ABNORMAL LOW (ref 6.0–8.3)

## 2012-12-15 LAB — CBC
HCT: 30.9 % — ABNORMAL LOW (ref 39.0–52.0)
HCT: 29 % — ABNORMAL LOW (ref 39.0–52.0)
Hemoglobin: 10.8 g/dL — ABNORMAL LOW (ref 13.0–17.0)
Hemoglobin: 10.1 g/dL — ABNORMAL LOW (ref 13.0–17.0)
MCH: 31.4 pg (ref 26.0–34.0)
MCH: 31.5 pg (ref 26.0–34.0)
MCHC: 35 g/dL (ref 30.0–36.0)
MCHC: 34.8 g/dL (ref 30.0–36.0)
MCV: 89.8 fL (ref 78.0–100.0)
MCV: 90.3 fL (ref 78.0–100.0)
Platelets: 57 10*3/uL — ABNORMAL LOW (ref 150–400)
Platelets: 93 10*3/uL — ABNORMAL LOW (ref 150–400)
RBC: 3.44 MIL/uL — ABNORMAL LOW (ref 4.22–5.81)
RBC: 3.21 MIL/uL — ABNORMAL LOW (ref 4.22–5.81)
RDW: 17 % — ABNORMAL HIGH (ref 11.5–15.5)
RDW: 16.8 % — ABNORMAL HIGH (ref 11.5–15.5)
WBC: 11.3 10*3/uL — ABNORMAL HIGH (ref 4.0–10.5)
WBC: 11.3 10*3/uL — ABNORMAL HIGH (ref 4.0–10.5)

## 2012-12-15 LAB — BASIC METABOLIC PANEL
BUN: 22 mg/dL (ref 6–23)
CO2: 34 mEq/L — ABNORMAL HIGH (ref 19–32)
Calcium: 8.3 mg/dL — ABNORMAL LOW (ref 8.4–10.5)
Chloride: 94 mEq/L — ABNORMAL LOW (ref 96–112)
Creatinine, Ser: 1 mg/dL (ref 0.50–1.35)
GFR calc Af Amer: 85 mL/min — ABNORMAL LOW (ref >90)
GFR calc non Af Amer: 74 mL/min — ABNORMAL LOW (ref >90)
Glucose, Bld: 105 mg/dL — ABNORMAL HIGH (ref 70–99)
Potassium: 3.2 mEq/L — ABNORMAL LOW (ref 3.5–5.1)
Sodium: 135 mEq/L (ref 135–145)

## 2012-12-15 LAB — GLUCOSE, CAPILLARY
Glucose-Capillary: 120 mg/dL — ABNORMAL HIGH (ref 70–99)
Glucose-Capillary: 124 mg/dL — ABNORMAL HIGH (ref 70–99)
Glucose-Capillary: 93 mg/dL (ref 70–99)
Glucose-Capillary: 91 mg/dL (ref 70–99)

## 2012-12-15 LAB — CARBOXYHEMOGLOBIN
Carboxyhemoglobin: 1.7 % — ABNORMAL HIGH (ref 0.5–1.5)
Methemoglobin: 1.5 % (ref 0.0–1.5)
O2 Saturation: 53.7 %
Total hemoglobin: 10.7 g/dL — ABNORMAL LOW (ref 13.5–18.0)

## 2012-12-15 LAB — SEDIMENTATION RATE: Sed Rate: 75 mm/hr — ABNORMAL HIGH (ref 0–16)

## 2012-12-15 LAB — PREPARE RBC (CROSSMATCH)

## 2012-12-15 SURGERY — INSERTION, CATHETER, CENTRAL VENOUS, ADULT
Anesthesia: Monitor Anesthesia Care | Site: Neck | Laterality: Right | Wound class: Clean

## 2012-12-15 MED ORDER — ALBUMIN HUMAN 5 % IV SOLN: 12.5000 g | Freq: Once | INTRAVENOUS | Status: AC

## 2012-12-15 MED ORDER — DEXTROSE 5 % IV SOLN
1.5000 g | INTRAVENOUS | Status: AC
  Administered 2012-12-15: 1.5 g via INTRAVENOUS
  Filled 2012-12-15: qty 1.5

## 2012-12-15 MED ORDER — DEXMEDETOMIDINE HCL IN NACL 400 MCG/100ML IV SOLN
0.4000 ug/kg/h | INTRAVENOUS | Status: AC
  Administered 2012-12-15: 0.7 ug/kg/h via INTRAVENOUS
  Filled 2012-12-15: qty 100

## 2012-12-15 MED ORDER — POTASSIUM CHLORIDE 10 MEQ/50ML IV SOLN
10.0000 meq | INTRAVENOUS | Status: AC | PRN
  Administered 2012-12-15 (×3): 10 meq via INTRAVENOUS

## 2012-12-15 MED ORDER — ALBUMIN HUMAN 5 % IV SOLN
12.5000 g | Freq: Once | INTRAVENOUS | Status: AC
  Administered 2012-12-15: 12.5 g via INTRAVENOUS

## 2012-12-15 MED ORDER — 0.9 % SODIUM CHLORIDE (POUR BTL) OPTIME
TOPICAL | Status: DC | PRN
  Administered 2012-12-15: 1000 mL

## 2012-12-15 MED ORDER — LIDOCAINE HCL (PF) 1 % IJ SOLN
INTRAMUSCULAR | Status: AC
  Filled 2012-12-15: qty 30

## 2012-12-15 MED ORDER — PHENYLEPHRINE HCL 10 MG/ML IJ SOLN
0.0000 ug/min | INTRAVENOUS | Status: DC
  Administered 2012-12-15: 70 ug/min via INTRAVENOUS
  Administered 2012-12-16: 15 ug/min via INTRAVENOUS
  Filled 2012-12-15 (×2): qty 4

## 2012-12-15 MED ORDER — HEPARIN SOD (PORK) LOCK FLUSH 100 UNIT/ML IV SOLN
INTRAVENOUS | Status: AC
  Filled 2012-12-15: qty 5

## 2012-12-15 MED ORDER — ALBUMIN HUMAN 5 % IV SOLN
INTRAVENOUS | Status: AC
  Administered 2012-12-15: 12.5 g
  Filled 2012-12-15: qty 250

## 2012-12-15 MED ORDER — DM-GUAIFENESIN ER 30-600 MG PO TB12
1.0000 | ORAL_TABLET | Freq: Two times a day (BID) | ORAL | Status: DC | PRN
  Filled 2012-12-15: qty 1

## 2012-12-15 MED ORDER — ALBUMIN HUMAN 5 % IV SOLN
INTRAVENOUS | Status: AC
  Filled 2012-12-15: qty 250

## 2012-12-15 MED ORDER — CALCIUM CHLORIDE 10 % IV SOLN
1.0000 g | Freq: Once | INTRAVENOUS | Status: AC
  Administered 2012-12-15: 1 g via INTRAVENOUS

## 2012-12-15 MED ORDER — LIDOCAINE HCL 1 % IJ SOLN
INTRAMUSCULAR | Status: DC | PRN
  Administered 2012-12-15: 20 mL

## 2012-12-15 MED ORDER — POTASSIUM CHLORIDE 10 MEQ/50ML IV SOLN
INTRAVENOUS | Status: AC
  Filled 2012-12-15: qty 150

## 2012-12-15 MED ORDER — AMIODARONE IV BOLUS ONLY 150 MG/100ML
150.0000 mg | Freq: Once | INTRAVENOUS | Status: AC
  Administered 2012-12-15: 150 mg via INTRAVENOUS

## 2012-12-15 MED ORDER — HYDROCORTISONE SOD SUCCINATE 100 MG IJ SOLR
100.0000 mg | Freq: Two times a day (BID) | INTRAMUSCULAR | Status: AC
  Administered 2012-12-15: 100 mg via INTRAVENOUS
  Administered 2012-12-16: 06:00:00 via INTRAVENOUS
  Administered 2012-12-16 – 2012-12-17 (×2): 100 mg via INTRAVENOUS
  Filled 2012-12-15 (×4): qty 2

## 2012-12-15 SURGICAL SUPPLY — 130 items
ADAPTER CARDIO PERF ANTE/RETRO (ADAPTER) IMPLANT
APPLIER CLIP 9.375 MED OPEN (MISCELLANEOUS)
APPLIER CLIP 9.375 SM OPEN (CLIP)
ATTRACTOMAT 16X20 MAGNETIC DRP (DRAPES) ×3 IMPLANT
BAG DECANTER FOR FLEXI CONT (MISCELLANEOUS) IMPLANT
BANDAGE ELASTIC 4 VELCRO ST LF (GAUZE/BANDAGES/DRESSINGS) IMPLANT
BANDAGE ELASTIC 6 VELCRO ST LF (GAUZE/BANDAGES/DRESSINGS) IMPLANT
BANDAGE GAUZE ELAST BULKY 4 IN (GAUZE/BANDAGES/DRESSINGS) IMPLANT
BLADE CORE FAN STRYKER (BLADE) ×6 IMPLANT
BLADE SAW SAG 29X58X.64 (BLADE) ×3 IMPLANT
BLADE STERNUM SYSTEM 6 (BLADE) ×3 IMPLANT
BLADE SURG 11 STRL SS (BLADE) IMPLANT
BLADE SURG 12 STRL SS (BLADE) ×3 IMPLANT
BLADE SURG ROTATE 9660 (MISCELLANEOUS) IMPLANT
CANISTER SUCTION 2500CC (MISCELLANEOUS) ×3 IMPLANT
CANNULA GUNDRY RCSP 15FR (MISCELLANEOUS) IMPLANT
CATH RETROPLEGIA CORONARY 14FR (CATHETERS) IMPLANT
CATH ROBINSON RED A/P 18FR (CATHETERS) IMPLANT
CATH THORACIC 28FR (CATHETERS) IMPLANT
CATH THORACIC 28FR RT ANG (CATHETERS) IMPLANT
CATH THORACIC 36FR (CATHETERS) IMPLANT
CATH THORACIC 36FR RT ANG (CATHETERS) IMPLANT
CLIP APPLIE 9.375 MED OPEN (MISCELLANEOUS) IMPLANT
CLIP APPLIE 9.375 SM OPEN (CLIP) IMPLANT
CLIP FOGARTY SPRING 6M (CLIP) IMPLANT
CLIP TI MEDIUM 24 (CLIP) IMPLANT
CLIP TI WIDE RED SMALL 24 (CLIP) IMPLANT
CONN Y 3/8X3/8X3/8  BEN (MISCELLANEOUS)
CONN Y 3/8X3/8X3/8 BEN (MISCELLANEOUS) IMPLANT
CORONARY SUCKER SOFT TIP 10052 (MISCELLANEOUS) IMPLANT
COVER SURGICAL LIGHT HANDLE (MISCELLANEOUS) ×3 IMPLANT
CRADLE DONUT ADULT HEAD (MISCELLANEOUS) ×3 IMPLANT
DRAPE C-ARM 42X72 X-RAY (DRAPES) ×3 IMPLANT
DRAPE CARDIOVASCULAR INCISE (DRAPES) ×1
DRAPE SLUSH/WARMER DISC (DRAPES) ×3 IMPLANT
DRAPE SRG 135X102X78XABS (DRAPES) ×2 IMPLANT
DRSG AQUACEL AG ADV 3.5X14 (GAUZE/BANDAGES/DRESSINGS) IMPLANT
ELECT BLADE 4.0 EZ CLEAN MEGAD (MISCELLANEOUS) ×3
ELECT BLADE 6.5 EXT (BLADE) ×3 IMPLANT
ELECT CAUTERY BLADE 6.4 (BLADE) IMPLANT
ELECT REM PT RETURN 9FT ADLT (ELECTROSURGICAL) ×6
ELECTRODE BLDE 4.0 EZ CLN MEGD (MISCELLANEOUS) ×2 IMPLANT
ELECTRODE REM PT RTRN 9FT ADLT (ELECTROSURGICAL) ×4 IMPLANT
GAUZE SPONGE 2X2 8PLY STRL LF (GAUZE/BANDAGES/DRESSINGS) ×2 IMPLANT
GAUZE XEROFORM 1X8 LF (GAUZE/BANDAGES/DRESSINGS) ×3 IMPLANT
GLOVE BIO SURGEON STRL SZ 6 (GLOVE) ×6 IMPLANT
GLOVE BIO SURGEON STRL SZ 6.5 (GLOVE) ×6 IMPLANT
GLOVE BIO SURGEON STRL SZ7 (GLOVE) ×3 IMPLANT
GLOVE BIO SURGEON STRL SZ7.5 (GLOVE) ×3 IMPLANT
GLOVE BIOGEL PI IND STRL 6 (GLOVE) ×4 IMPLANT
GLOVE BIOGEL PI IND STRL 6.5 (GLOVE) ×4 IMPLANT
GLOVE BIOGEL PI IND STRL 7.0 (GLOVE) ×4 IMPLANT
GLOVE BIOGEL PI INDICATOR 6 (GLOVE) ×2
GLOVE BIOGEL PI INDICATOR 6.5 (GLOVE) ×2
GLOVE BIOGEL PI INDICATOR 7.0 (GLOVE) ×2
GLOVE EUDERMIC 7 POWDERFREE (GLOVE) IMPLANT
GLOVE ORTHO TXT STRL SZ7.5 (GLOVE) IMPLANT
GOWN STRL NON-REIN LRG LVL3 (GOWN DISPOSABLE) ×12 IMPLANT
HEMOSTAT POWDER SURGIFOAM 1G (HEMOSTASIS) IMPLANT
HEMOSTAT SURGICEL 2X14 (HEMOSTASIS) IMPLANT
INSERT FOGARTY 61MM (MISCELLANEOUS) IMPLANT
INSERT FOGARTY XLG (MISCELLANEOUS) IMPLANT
KIT BASIN OR (CUSTOM PROCEDURE TRAY) ×3 IMPLANT
KIT ROOM TURNOVER OR (KITS) ×3 IMPLANT
KIT SUCTION CATH 14FR (SUCTIONS) IMPLANT
KIT VASOVIEW W/TROCAR VH 2000 (KITS) IMPLANT
MARKER GRAFT CORONARY BYPASS (MISCELLANEOUS) IMPLANT
NS IRRIG 1000ML POUR BTL (IV SOLUTION) ×15 IMPLANT
PACK OPEN HEART (CUSTOM PROCEDURE TRAY) ×3 IMPLANT
PAD ARMBOARD 7.5X6 YLW CONV (MISCELLANEOUS) ×6 IMPLANT
PAD DEFIB R2 (MISCELLANEOUS) IMPLANT
PAD ELECT DEFIB RADIOL ZOLL (MISCELLANEOUS) ×3 IMPLANT
PENCIL BUTTON HOLSTER BLD 10FT (ELECTRODE) IMPLANT
PUNCH AORTIC ROTATE 4.0MM (MISCELLANEOUS) IMPLANT
PUNCH AORTIC ROTATE 4.5MM 8IN (MISCELLANEOUS) IMPLANT
PUNCH AORTIC ROTATE 5MM 8IN (MISCELLANEOUS) IMPLANT
SENSOR MYOCARDIAL TEMP (MISCELLANEOUS) IMPLANT
SOLUTION ANTI FOG 6CC (MISCELLANEOUS) IMPLANT
SPONGE GAUZE 2X2 STER 10/PKG (GAUZE/BANDAGES/DRESSINGS) ×1
SPONGE GAUZE 4X4 12PLY (GAUZE/BANDAGES/DRESSINGS) ×3 IMPLANT
SPONGE INTESTINAL PEANUT (DISPOSABLE) IMPLANT
SPONGE LAP 18X18 X RAY DECT (DISPOSABLE) IMPLANT
SPONGE LAP 4X18 X RAY DECT (DISPOSABLE) IMPLANT
SUT BONE WAX W31G (SUTURE) IMPLANT
SUT ETHIBOND 2 0 SH (SUTURE)
SUT ETHIBOND 2 0 SH 36X2 (SUTURE) IMPLANT
SUT ETHILON 3 0 FSL (SUTURE) ×6 IMPLANT
SUT MNCRL AB 4-0 PS2 18 (SUTURE) IMPLANT
SUT PROLENE 3 0 SH 1 (SUTURE) IMPLANT
SUT PROLENE 3 0 SH DA (SUTURE) IMPLANT
SUT PROLENE 3 0 SH1 36 (SUTURE) IMPLANT
SUT PROLENE 4 0 RB 1 (SUTURE) ×2
SUT PROLENE 4 0 SH DA (SUTURE) IMPLANT
SUT PROLENE 4-0 RB1 .5 CRCL 36 (SUTURE) ×4 IMPLANT
SUT PROLENE 5 0 C 1 36 (SUTURE) IMPLANT
SUT PROLENE 6 0 C 1 30 (SUTURE) IMPLANT
SUT PROLENE 6 0 CC (SUTURE) IMPLANT
SUT PROLENE 7 0 BV 1 (SUTURE) IMPLANT
SUT PROLENE 7 0 BV1 MDA (SUTURE) IMPLANT
SUT PROLENE 7 0 DA (SUTURE) IMPLANT
SUT PROLENE 7.0 RB 3 (SUTURE) IMPLANT
SUT PROLENE 8 0 BV175 6 (SUTURE) IMPLANT
SUT PROLENE BLUE 7 0 (SUTURE) IMPLANT
SUT PROLENE POLY MONO (SUTURE) IMPLANT
SUT SILK  1 MH (SUTURE)
SUT SILK 1 MH (SUTURE) IMPLANT
SUT SILK 2 0 SH CR/8 (SUTURE) IMPLANT
SUT SILK 3 0 SH CR/8 (SUTURE) IMPLANT
SUT STEEL 6MS V (SUTURE) IMPLANT
SUT STEEL STERNAL CCS#1 18IN (SUTURE) IMPLANT
SUT STEEL SZ 6 DBL 3X14 BALL (SUTURE) IMPLANT
SUT VIC AB 1 CTX 36 (SUTURE) ×2
SUT VIC AB 1 CTX36XBRD ANBCTR (SUTURE) ×4 IMPLANT
SUT VIC AB 2-0 CT1 27 (SUTURE)
SUT VIC AB 2-0 CT1 TAPERPNT 27 (SUTURE) IMPLANT
SUT VIC AB 2-0 CTX 27 (SUTURE) IMPLANT
SUT VIC AB 3-0 SH 27 (SUTURE)
SUT VIC AB 3-0 SH 27X BRD (SUTURE) IMPLANT
SUT VIC AB 3-0 SH 8-18 (SUTURE) ×3 IMPLANT
SUT VIC AB 3-0 X1 27 (SUTURE) IMPLANT
SUTURE E-PAK OPEN HEART (SUTURE) ×3 IMPLANT
SYSTEM SAHARA CHEST DRAIN ATS (WOUND CARE) IMPLANT
TAPE CLOTH SURG 4X10 WHT LF (GAUZE/BANDAGES/DRESSINGS) ×3 IMPLANT
TOWEL OR 17X24 6PK STRL BLUE (TOWEL DISPOSABLE) ×6 IMPLANT
TOWEL OR 17X26 10 PK STRL BLUE (TOWEL DISPOSABLE) ×6 IMPLANT
TRAY FOLEY IC TEMP SENS 14FR (CATHETERS) IMPLANT
TUBE CONNECTING 12X1/4 (SUCTIONS) ×3 IMPLANT
TUBING INSUFFLATION 10FT LAP (TUBING) IMPLANT
UNDERPAD 30X30 INCONTINENT (UNDERPADS AND DIAPERS) ×3 IMPLANT
WATER STERILE IRR 1000ML POUR (IV SOLUTION) ×6 IMPLANT

## 2012-12-15 NOTE — Transfer of Care (Signed)
Immediate Anesthesia Transfer of Care Note  Patient: Gregory Turner  Procedure(s) Performed: Procedure(s): REMOVAL OF CENTRAL LINE ADULT- FROM NECK (Right)  Patient Location: ICU  Anesthesia Type:MAC  Level of Consciousness: awake and alert   Airway & Oxygen Therapy: Patient Spontanous Breathing and Patient connected to face mask oxygen  Post-op Assessment: Post -op Vital signs reviewed and stable  Post vital signs: Reviewed and stable  Complications: No apparent anesthesia complications

## 2012-12-15 NOTE — Preoperative (Signed)
Beta Blockers   Reason not to administer Beta Blockers:Hold beta blocker due to hypotension, Concurrent use of intravenous inotropic medications during the perioperative

## 2012-12-15 NOTE — Anesthesia Procedure Notes (Signed)
Procedure Name: MAC Date/Time: 12/15/2012 7:38 AM Performed by: Alanda Amass A Pre-anesthesia Checklist: Patient identified, Timeout performed, Emergency Drugs available, Suction available and Patient being monitored Oxygen Delivery Method: Circle system utilized

## 2012-12-15 NOTE — Progress Notes (Signed)
The patient was examined and preop studies reviewed. There has been no change from the prior exam and the patient is ready for surgery.   Plan neck, possible mediastinal exploration for manipulation of PA catheter today on E Davoli

## 2012-12-15 NOTE — Brief Op Note (Signed)
12/08/2012 - 12/15/2012  8:38 AM  PATIENT:  Gregory Turner  72 y.o. male  PRE-OPERATIVE DIAGNOSIS:  retained pa catheter  POST-OPERATIVE DIAGNOSIS:  retained pa catheter  PROCEDURE:  Procedure(s): REMOVAL OF CENTRAL LINE ADULT- FROM NECK (Right)  SURGEON:  Surgeon(s) and Role:    * Kerin Perna, MD - Primary  PHYSICIAN ASSISTANT:   ASSISTANTS: none   ANESTHESIA:   IV sedation  EBL:     BLOOD ADMINISTERED:none  DRAINS: none   LOCAL MEDICATIONS USED:  LIDOCAINE  and Amount: 10 ml  SPECIMEN:  No Specimen  DISPOSITION OF SPECIMEN:  N/A  COUNTS:  YES  TOURNIQUET:  * No tourniquets in log *  DICTATION: .Dragon Dictation  PLAN OF CARE: Admit to inpatient   PATIENT DISPOSITION:  SICU extubated   Delay start of Pharmacological VTE agent (>24hrs) due to surgical blood loss or risk of bleeding: yes

## 2012-12-15 NOTE — Progress Notes (Signed)
PT Cancellation Note  Patient Details Name: Gregory Turner MRN: [id number] DOB: 07-03-1940   Cancelled Treatment:    Reason Eval/Treat Not Completed: Patient at procedure or test/unavailable; currently in OR.  Will check later this pm as time permits to complete PT eval.     Chevonne Bostrom,CYNDI 12/15/2012, 8:40 AM

## 2012-12-15 NOTE — Anesthesia Postprocedure Evaluation (Signed)
  Anesthesia Post-op Note  Patient: Gregory Turner  Procedure(s) Performed: Procedure(s): REMOVAL OF CENTRAL LINE ADULT- FROM NECK (Right)  Patient Location: ICU  Anesthesia Type:MAC  Level of Consciousness: awake and alert   Airway and Oxygen Therapy: Patient Spontanous Breathing and Patient connected to face mask oxygen  Post-op Pain: none  Post-op Assessment: Post-op Vital signs reviewed, Patient's Cardiovascular Status Stable and Respiratory Function Stable  Post-op Vital Signs: Reviewed and stable  Complications: No apparent anesthesia complications

## 2012-12-15 NOTE — Anesthesia Preprocedure Evaluation (Addendum)
Anesthesia Evaluation  Patient identified by MRN, date of birth, ID band Patient awake    Reviewed: Allergy & Precautions, H&P , NPO status , Patient's Chart, lab work & pertinent test results, reviewed documented beta blocker date and time   Airway Mallampati: II TM Distance: >3 FB Neck ROM: Full    Dental  (+) Teeth Intact   Pulmonary shortness of breath, COPDCurrent Smoker,  + rhonchi         Cardiovascular hypertension, Pt. on medications and Pt. on home beta blockers + Peripheral Vascular Disease Rhythm:Irregular Rate:Normal     Neuro/Psych    GI/Hepatic   Endo/Other    Renal/GU      Musculoskeletal   Abdominal   Peds  Hematology   Anesthesia Other Findings S/p bentall with retained PAC S/p AAA endograft cxr with effusion, edema Thrombocytopenia (xfused last night)  Reproductive/Obstetrics                       Anesthesia Physical Anesthesia Plan  ASA: III  Anesthesia Plan: MAC   Post-op Pain Management:    Induction: Intravenous  Airway Management Planned: Natural Airway and Simple Face Mask  Additional Equipment:   Intra-op Plan:   Post-operative Plan:   Informed Consent: I have reviewed the patients History and Physical, chart, labs and discussed the procedure including the risks, benefits and alternatives for the proposed anesthesia with the patient or authorized representative who has indicated his/her understanding and acceptance.     Plan Discussed with: CRNA and Anesthesiologist  Anesthesia Plan Comments:         Anesthesia Quick Evaluation

## 2012-12-15 NOTE — Progress Notes (Signed)
PT Cancellation Note  Patient Details Name: DERRON PIPKINS MRN: [id number] DOB: [birth year]   Cancelled Treatment:    Reason Eval/Treat Not Completed: Fatigue/lethargy limiting ability to participate; pt still groggy from meds from procedure this morning.  RN assisted pt up to chair.  Will see tomorrow for PT eval.   Chiron Campione,CYNDI 12/15/2012, 1:27 PM

## 2012-12-15 NOTE — Addendum Note (Signed)
Addendum created 12/15/12 1104 by Atilano Ina, CRNA   Modules edited: Anesthesia Review and Sign Navigator Section

## 2012-12-15 NOTE — Op Note (Signed)
NAMEKYZER, BLOWE NO.:  [id number]  MEDICAL RECORD NO.:  [id number]  LOCATION:  2S09C                        FACILITY:  [facility]  PHYSICIAN:  Kerin Perna, M.D.  DATE OF BIRTH:  Aug 09, 1940  DATE OF PROCEDURE:  12/15/2012 DATE OF DISCHARGE:                              OPERATIVE REPORT   OPERATION:  Removal of central venous catheter from right neck.  SURGEON:  Kerin Perna, M.D.  ANESTHESIA:  IV sedation and 1% local lidocaine infiltration.  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  POSTOPERATIVE DIAGNOSIS:  Retained pulmonary artery catheter after redo Bentall root replacement and replacement of ascending fusiform aneurysm.  INDICATIONS:  The patient is a 72 year old gentleman who underwent redo AVR with a root replacement and replacement of the fusiform ascending aneurysm with hemiarch reconstruction.  This was performed 4 days ago. On the second postoperative day an attempt to remove the Swan catheter was unsuccessful.  It was felt that Swan catheter was probably retained by a suture placed during pulmonary artery repair, which was done emergently after separation from cardiopulmonary bypass, and decannulation.  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  I recommended removal of the catheter with securing the end in the subcutaneous pocket since removal of the neck line as a source of possible infection as well as to improve mobilization of the patient would optimize his postop care.  I discussed the procedure in detail with both the patient and his family and informed consent was obtained.  OPERATIVE PROCEDURE:  The patient was brought directly from the SICU to the operating room and placed supine on  the operating table and maintained on oxygen mask ventilation.  A proper time-out was performed and the right neck and chest was prepped and draped as a sterile field. Lidocaine 1% was infiltrated around the entry site of the introducer sheath, into the neck.  The Ernestine Conrad was then divided off the sterile field by the anesthesia team and the sheath was removed, as I grabbed the [place] catheter.  Pressure was held on the exit site to stop bleeding.  Under C-arm fluoroscopy, gentle pressure was placed on the Swan catheter to see if it would be extracted, however, the catheter showed resistance to movement and the exact spot of entrapment could not be ascertained.  I then divided the Swan catheter to minimize the length, placed two surgical clips on the end and then tied a 4-0 Prolene around the catheter and clips several times and then secured this into the strap muscle in the neck in the subcutaneous pocket.  The wound was irrigated. I closed the incision with interrupted 3-0 Vicryl for the subcutaneous layer and interrupted 3-0 nylon for the skin.  Sterile dressings were applied.  A C-arm picture showed the catheter to be secured proximally and distally in the vasculature.  The patient was then transported back to ICU in stable condition.     Kerin Perna, M.D.     PV/MEDQ  D:  12/15/2012  T:  12/15/2012  Job:  [id number]

## 2012-12-15 NOTE — Progress Notes (Signed)
Day of Surgery  Subjective: Postop day #4 redo root replacement biologic-Bentall and replacement of ascending fusiform aneurysm Postoperative severe thrombocytopenia Postoperative edema-ARDS pattern on chest x-ray with low oxygenation-sedimentation rate 75 with normal WBC Postoperative atrial fibrillation on IV amiodarone Patient was out of bed to chair today after the Swann catheter was removed from the neck to improve mobility. Objective: Vital signs in last 24 hours: Temp:  [98.1 F (36.7 C)-100 F (37.8 C)] 100 F (37.8 C) (11/28 1617) Pulse Rate:  [45-109] 94 (11/28 1800) Cardiac Rhythm:  [-] Atrial fibrillation (11/28 0900) Resp:  [16-36] 22 (11/28 1800) BP: (79-140)/(53-100) 91/55 mmHg (11/28 1800) SpO2:  [89 %-100 %] 91 % (11/28 1800) FiO2 (%):  [70 %-100 %] 70 % (11/28 1457) Weight:  [209 lb (94.802 kg)] 209 lb (94.802 kg) (11/28 0500)  Hemodynamic parameters for last 24 hours: PAP: (30-47)/(17-33) 47/33 mmHg  Intake/Output from previous day: 11/27 0701 - 11/28 0700 In: 3893.8 [P.O.:440; I.V.:2318.8; Blood:185; IV Piggyback:950] Out: 3685 [Urine:3555; Chest Tube:130] Intake/Output this shift:    Alert and appropriate Lungs with scattered crackles Heart rhythm regular, no cardiac murmur Extremities warm with edema   Lab Results:  Recent Labs  12/15/12 0313 12/15/12 1510  WBC 11.3* 11.3*  HGB 10.8* 10.1*  HCT 30.9* 29.0*  PLT 57* 93*   BMET:  Recent Labs  12/15/12 0313 12/15/12 1510  NA 135 135  K 3.3* 3.2*  CL 95* 94*  CO2 33* 34*  GLUCOSE 126* 105*  BUN 22 22  CREATININE 1.01 1.00  CALCIUM 8.0* 8.3*    PT/INR: No results found for this basename: LABPROT, INR,  in the last 72 hours ABG    Component Value Date/Time   PHART 7.450 12/14/2012 0823   HCO3 27.7* 12/14/2012 0823   TCO2 29 12/14/2012 0823   ACIDBASEDEF 1.0 12/12/2012 0449   O2SAT 53.7 12/15/2012 0455   CBG (last 3)   Recent Labs  12/15/12 0350 12/15/12 1205 12/15/12 1614   GLUCAP 120* 124* 93    Assessment/Plan: S/P Procedure(s) (LRB): REMOVAL OF CENTRAL LINE ADULT- FROM NECK (Right) Plan to continue IV Lasix drip due to severe hypoxemia from probable ARDS. 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

## 2012-12-16 ENCOUNTER — Inpatient Hospital Stay (HOSPITAL_COMMUNITY): Payer: Medicare PPO

## 2012-12-16 LAB — COMPREHENSIVE METABOLIC PANEL
ALT: 21 U/L (ref 0–53)
AST: 23 U/L (ref 0–37)
Albumin: 2.6 g/dL — ABNORMAL LOW (ref 3.5–5.2)
Alkaline Phosphatase: 65 U/L (ref 39–117)
BUN: 23 mg/dL (ref 6–23)
CO2: 34 mEq/L — ABNORMAL HIGH (ref 19–32)
Calcium: 8.2 mg/dL — ABNORMAL LOW (ref 8.4–10.5)
Chloride: 95 mEq/L — ABNORMAL LOW (ref 96–112)
Creatinine, Ser: 0.94 mg/dL (ref 0.50–1.35)
GFR calc Af Amer: >90 mL/min (ref >90)
GFR calc non Af Amer: 82 mL/min — ABNORMAL LOW (ref >90)
Glucose, Bld: 109 mg/dL — ABNORMAL HIGH (ref 70–99)
Potassium: 3.3 mEq/L — ABNORMAL LOW (ref 3.5–5.1)
Sodium: 138 mEq/L (ref 135–145)
Total Bilirubin: 0.7 mg/dL (ref 0.3–1.2)
Total Protein: 5.5 g/dL — ABNORMAL LOW (ref 6.0–8.3)

## 2012-12-16 LAB — CARBOXYHEMOGLOBIN
Carboxyhemoglobin: 1.6 % — ABNORMAL HIGH (ref 0.5–1.5)
Methemoglobin: 0.8 % (ref 0.0–1.5)
O2 Saturation: 63.5 %
Total hemoglobin: 9.8 g/dL — ABNORMAL LOW (ref 13.5–18.0)

## 2012-12-16 LAB — BASIC METABOLIC PANEL
BUN: 28 mg/dL — ABNORMAL HIGH (ref 6–23)
CO2: 34 mEq/L — ABNORMAL HIGH (ref 19–32)
Calcium: 8.7 mg/dL (ref 8.4–10.5)
Chloride: 95 mEq/L — ABNORMAL LOW (ref 96–112)
Creatinine, Ser: 0.87 mg/dL (ref 0.50–1.35)
GFR calc Af Amer: >90 mL/min (ref >90)
GFR calc non Af Amer: 85 mL/min — ABNORMAL LOW (ref >90)
Glucose, Bld: 142 mg/dL — ABNORMAL HIGH (ref 70–99)
Potassium: 3.3 mEq/L — ABNORMAL LOW (ref 3.5–5.1)
Sodium: 137 mEq/L (ref 135–145)

## 2012-12-16 LAB — CBC
HCT: 29 % — ABNORMAL LOW (ref 39.0–52.0)
Hemoglobin: 9.8 g/dL — ABNORMAL LOW (ref 13.0–17.0)
MCH: 30.9 pg (ref 26.0–34.0)
MCHC: 33.8 g/dL (ref 30.0–36.0)
MCV: 91.5 fL (ref 78.0–100.0)
Platelets: 92 10*3/uL — ABNORMAL LOW (ref 150–400)
RBC: 3.17 MIL/uL — ABNORMAL LOW (ref 4.22–5.81)
RDW: 16.6 % — ABNORMAL HIGH (ref 11.5–15.5)
WBC: 11.1 10*3/uL — ABNORMAL HIGH (ref 4.0–10.5)

## 2012-12-16 LAB — PREPARE PLATELET PHERESIS: Unit division: 0

## 2012-12-16 LAB — GLUCOSE, CAPILLARY
Glucose-Capillary: 103 mg/dL — ABNORMAL HIGH (ref 70–99)
Glucose-Capillary: 106 mg/dL — ABNORMAL HIGH (ref 70–99)
Glucose-Capillary: 93 mg/dL (ref 70–99)
Glucose-Capillary: 151 mg/dL — ABNORMAL HIGH (ref 70–99)
Glucose-Capillary: 124 mg/dL — ABNORMAL HIGH (ref 70–99)
Glucose-Capillary: 96 mg/dL (ref 70–99)

## 2012-12-16 LAB — APTT
aPTT: 40 seconds — ABNORMAL HIGH (ref 24–37)
aPTT: 60 seconds — ABNORMAL HIGH (ref 24–37)
aPTT: 58 seconds — ABNORMAL HIGH (ref 24–37)

## 2012-12-16 MED ORDER — SODIUM CHLORIDE 0.9 % IV SOLN
0.0400 mg/kg/h | INTRAVENOUS | Status: DC
  Administered 2012-12-16 – 2012-12-17 (×2): 0.04 mg/kg/h via INTRAVENOUS
  Filled 2012-12-16 (×4): qty 250

## 2012-12-16 MED ORDER — BIVALIRUDIN 250 MG IV SOLR
0.0500 mg/kg/h | INTRAVENOUS | Status: DC
  Filled 2012-12-16: qty 250

## 2012-12-16 MED ORDER — POTASSIUM CHLORIDE 10 MEQ/50ML IV SOLN
INTRAVENOUS | Status: AC
  Filled 2012-12-16: qty 50

## 2012-12-16 MED ORDER — FUROSEMIDE 10 MG/ML IJ SOLN
20.0000 mg | Freq: Two times a day (BID) | INTRAMUSCULAR | Status: DC
  Administered 2012-12-16 – 2012-12-17 (×4): 20 mg via INTRAVENOUS
  Filled 2012-12-16 (×8): qty 2

## 2012-12-16 MED ORDER — POTASSIUM CHLORIDE 10 MEQ/50ML IV SOLN
10.0000 meq | INTRAVENOUS | Status: AC
  Administered 2012-12-16 (×3): 10 meq via INTRAVENOUS
  Filled 2012-12-16 (×3): qty 50

## 2012-12-16 MED ORDER — POTASSIUM CHLORIDE 10 MEQ/50ML IV SOLN
10.0000 meq | INTRAVENOUS | Status: AC
  Administered 2012-12-16 (×3): 10 meq via INTRAVENOUS

## 2012-12-16 MED ORDER — SODIUM CHLORIDE 0.9 % IV SOLN
0.0400 mg/kg/h | INTRAVENOUS | Status: DC
  Filled 2012-12-16: qty 250

## 2012-12-16 MED ORDER — VANCOMYCIN HCL IN DEXTROSE 1-5 GM/200ML-% IV SOLN
1000.0000 mg | INTRAVENOUS | Status: DC
  Administered 2012-12-17: 1000 mg via INTRAVENOUS
  Filled 2012-12-16: qty 200

## 2012-12-16 NOTE — Evaluation (Signed)
Physical Therapy Evaluation Patient Details Name: Gregory Turner MRN: [id number] DOB: 1940-12-30 Today's Date: 12/16/2012 Time: 1324-4010 PT Time Calculation (min): 26 min  PT Assessment / Plan / Recommendation History of Present Illness  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  Post-op ARDS.  Clinical Impression  Patient is s/p above surgery resulting in functional limitations due to the deficits listed below (see PT Problem List).  Patient will benefit from skilled PT to increase their independence and safety with mobility to allow discharge home with family.     PT Assessment  Patient needs continued PT services    Follow Up Recommendations  Home health PT    Does the patient have the potential to tolerate intense rehabilitation      Barriers to Discharge        Equipment Recommendations  Other (comment) (TBD)    Recommendations for Other Services OT consult   Frequency Min 3X/week    Precautions / Restrictions Precautions Precautions: Sternal;Fall   Pertinent Vitals/Pain VSS. Pt on nonrebreather.      Mobility  Bed Mobility Bed Mobility: Supine to Sit;Sitting - Scoot to Edge of Bed Supine to Sit: 1: +2 Total assist Supine to Sit: Patient Percentage: 50% Sitting - Scoot to Edge of Bed: 2: Max assist Transfers Transfers: Sit to Stand;Stand to [employer] Transfers Sit to Stand: 1: +2 Total assist;Without upper extremity assist;From bed;From chair/3-in-1 Sit to Stand: Patient Percentage: 60% Stand to Sit: 1: +2 Total assist;Without upper extremity assist;To chair/3-in-1 Stand to Sit: Patient Percentage: 60% Stand Pivot Transfers: 1: +2 Total assist Stand Pivot Transfers: Patient Percentage: 60% Details for Transfer Assistance: Verbal cues to place hands on knees to encourage use of legs to stand.  Used walker for stand pivot. Ambulation/Gait Ambulation/Gait Assistance: 1: +2 Total assist Ambulation/Gait: Patient Percentage:  70% Ambulation Distance (Feet): 5 Feet Assistive device: Rolling walker Ambulation/Gait Assistance Details: Verbal cues to stand more erect. Gait Pattern: Step-through pattern;Decreased step length - right;Decreased step length - left;Trunk flexed;Shuffle Gait velocity: slow General Gait Details: Amb distance limited by repeated need to have BM.    Exercises     PT Diagnosis: Difficulty walking;Generalized weakness  PT Problem List: Decreased strength;Decreased activity tolerance;Decreased balance;Decreased mobility;Decreased knowledge of use of DME;Decreased knowledge of precautions PT Treatment Interventions: DME instruction;Gait training;Functional mobility training;Therapeutic activities;Therapeutic exercise;Balance training;Patient/family education     PT Goals(Current goals can be found in the care plan section) Acute Rehab PT Goals Patient Stated Goal: Return home PT Goal Formulation: With patient Time For Goal Achievement: 12/23/12 Potential to Achieve Goals: Good  Visit Information  Last PT Received On: 12/16/12 Assistance Needed: +2 History of Present Illness: 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  Post-op ARDS.       Prior Functioning       Cognition  Cognition Arousal/Alertness: Awake/alert Behavior During Therapy: WFL for tasks assessed/performed Overall Cognitive Status: Within Functional Limits for tasks assessed    Extremity/Trunk Assessment Upper Extremity Assessment Upper Extremity Assessment: Generalized weakness Lower Extremity Assessment Lower Extremity Assessment: Generalized weakness   Balance Balance Balance Assessed: Yes Static Sitting Balance Static Sitting - Balance Support: Bilateral upper extremity supported Static Sitting - Level of Assistance: 5: Stand by assistance Static Standing Balance Static Standing - Balance Support: Bilateral upper extremity supported Static Standing - Level of Assistance: 4: Min assist   End of Session PT - End of Session Activity Tolerance: Patient limited by fatigue Patient left: in chair;with call  bell/phone within reach Nurse Communication: Mobility status  GP     [facility] 12/16/2012, 2:56 PM  [facility] PT [phone]

## 2012-12-16 NOTE — Progress Notes (Signed)
ANTICOAGULATION CONSULT NOTE - Initial Consult  Pharmacy Consult for Bivalirudin Indication: atrial fibrillation, severe thrombocytopenia  Allergies  Allergen Reactions  . Lisinopril Cough  . Sulfa Drugs Cross Reactors Other (See Comments)    Makes patient feel faint    Patient Measurements: Height: 5\' 10"  (177.8 cm) Weight: 205 lb 7.5 oz (93.2 kg) IBW/kg (Calculated) : 73  Vital Signs: Temp: 97.5 F (36.4 C) (11/29 1248) Temp src: Oral (11/29 1248) BP: 100/57 mmHg (11/29 1000) Pulse Rate: 87 (11/29 1000)  Labs:  Recent Labs  12/15/12 0313 12/15/12 1510 12/16/12 0404 12/16/12 0900  HGB 10.8* 10.1* 9.8*  --   HCT 30.9* 29.0* 29.0*  --   PLT 57* 93* 92*  --   APTT  --   --   --  40*  CREATININE 1.01 1.00 0.94  --     Estimated Creatinine Clearance: 82.7 ml/min (by C-G formula based on Cr of 0.94).   Medical History: Past Medical History  Diagnosis Date  . HTN (hypertension)   . COPD (chronic obstructive pulmonary disease)   . Bronchitis   . Afib   . H/O echocardiogram 04/2011  . 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 Shortness of breath     walks 1/2 mile every evening, /w some SOB  . Blood transfusion     many yrs. ago  . Arthritis     shoulders, back & hips   . Hernia, inguinal     will need surgery on this in the future   . AAA (abdominal aortic aneurysm)     Medications:  Infusions:  . sodium chloride 20 mL/hr at 12/15/12 2100  . amiodarone (NEXTERONE PREMIX) 360 mg/200 mL dextrose 30 mg/hr (12/16/12 1328)  . bivalirudin (ANGIOMAX) infusion 0.5 mg/mL (Non-ACS indications) 0.04 mg/kg/hr (12/16/12 1133)  . DOPamine 3 mcg/kg/min (12/16/12 1000)  . phenylephrine (NEO-SYNEPHRINE) Adult infusion 30 mcg/min (12/16/12 1000)    Assessment: 72 year old male s/p Bentall procedure with bioprosthetic valve on 11/24.  He has developed atrial fibrillation and has thrombocytopenia.  Bivalirudin was selected for  anticoagulation and initiated by Dr. Donata Clay today.  He has a slightly lower goal range requested by Dr. Donata Clay.  Goal of Therapy:  aPTT 50-70 seconds Monitor platelets by anticoagulation protocol: Yes   Plan:  Bivalirudin is infusing at 0.04 mg/kg/hr Check PTT now.  Estella Husk, Pharm.D., BCPS, AAHIVP Clinical Pharmacist Phone: [phone] or [phone] 12/16/2012, 2:03 PM

## 2012-12-16 NOTE — Progress Notes (Addendum)
ANTICOAGULATION CONSULT NOTE - Follow Up Consult  Pharmacy Consult for Bivalirudin Indication: atrial fibrillation, thrombocytopenia  Allergies  Allergen Reactions  . Lisinopril Cough  . Sulfa Drugs Cross Reactors Other (See Comments)    Makes patient feel faint    Patient Measurements: Height: 5\' 10"  (177.8 cm) Weight: 205 lb 7.5 oz (93.2 kg) IBW/kg (Calculated) : 73  Vital Signs: Temp: 97.6 F (36.4 C) (11/29 1602) Temp src: Oral (11/29 1602) BP: 119/71 mmHg (11/29 1600) Pulse Rate: 75 (11/29 1600)  Labs:  Recent Labs  12/15/12 0313 12/15/12 1510 12/16/12 0404 12/16/12 0900 12/16/12 1511  HGB 10.8* 10.1* 9.8*  --   --   HCT 30.9* 29.0* 29.0*  --   --   PLT 57* 93* 92*  --   --   APTT  --   --   --  40* 60*  CREATININE 1.01 1.00 0.94  --  0.87    Estimated Creatinine Clearance: 89.3 ml/min (by C-G formula based on Cr of 0.87).   Medications:  Infusions:  . sodium chloride 20 mL/hr at 12/15/12 2100  . amiodarone (NEXTERONE PREMIX) 360 mg/200 mL dextrose 30 mg/hr (12/16/12 1328)  . bivalirudin (ANGIOMAX) infusion 0.5 mg/mL (Non-ACS indications) 0.04 mg/kg/hr (12/16/12 1133)  . DOPamine 3 mcg/kg/min (12/16/12 1000)  . phenylephrine (NEO-SYNEPHRINE) Adult infusion 30 mcg/min (12/16/12 1000)    Assessment: 72 year old male s/p Bentall procedure with bioprosthetic valve on 11/24. He has developed atrial fibrillation and has thrombocytopenia. Bivalirudin was selected for anticoagulation and initiated by Dr. Donata Clay today. He has a slightly lower goal range requested by Dr. Donata Clay.  His initial PTT is within the goal range.  Goal of Therapy:  aPTT 50-70 seconds Monitor platelets by anticoagulation protocol: Yes   Plan:  Continue Bivalirudin at 0.04 mg/kg/hr Recheck PTT in 2 hours  Estella Husk, Pharm.D., BCPS, AAHIVP Clinical Pharmacist Phone: [phone] or [phone] 12/16/2012, 4:59 PM  12/16/2012, 7:17 PM:  Confirmatory PTT = 58 sec. - within  range Continue Bivalirudin at 0.04 mg/kg/hr Daily PTT and CBC  Mayford Knife, Eula Fried

## 2012-12-17 ENCOUNTER — Inpatient Hospital Stay (HOSPITAL_COMMUNITY): Payer: Medicare PPO

## 2012-12-17 LAB — GLUCOSE, CAPILLARY
Glucose-Capillary: 112 mg/dL — ABNORMAL HIGH (ref 70–99)
Glucose-Capillary: 117 mg/dL — ABNORMAL HIGH (ref 70–99)
Glucose-Capillary: 170 mg/dL — ABNORMAL HIGH (ref 70–99)
Glucose-Capillary: 189 mg/dL — ABNORMAL HIGH (ref 70–99)
Glucose-Capillary: 76 mg/dL (ref 70–99)
Glucose-Capillary: 94 mg/dL (ref 70–99)

## 2012-12-17 LAB — COMPREHENSIVE METABOLIC PANEL
ALT: 18 U/L (ref 0–53)
AST: 19 U/L (ref 0–37)
Albumin: 2.3 g/dL — ABNORMAL LOW (ref 3.5–5.2)
Alkaline Phosphatase: 59 U/L (ref 39–117)
BUN: 25 mg/dL — ABNORMAL HIGH (ref 6–23)
CO2: 33 mEq/L — ABNORMAL HIGH (ref 19–32)
Calcium: 8.2 mg/dL — ABNORMAL LOW (ref 8.4–10.5)
Chloride: 97 mEq/L (ref 96–112)
Creatinine, Ser: 0.84 mg/dL (ref 0.50–1.35)
GFR calc Af Amer: >90 mL/min (ref >90)
GFR calc non Af Amer: 86 mL/min — ABNORMAL LOW (ref >90)
Glucose, Bld: 106 mg/dL — ABNORMAL HIGH (ref 70–99)
Potassium: 3.5 mEq/L (ref 3.5–5.1)
Sodium: 139 mEq/L (ref 135–145)
Total Bilirubin: 0.5 mg/dL (ref 0.3–1.2)
Total Protein: 5.3 g/dL — ABNORMAL LOW (ref 6.0–8.3)

## 2012-12-17 LAB — CBC
HCT: 24.9 % — ABNORMAL LOW (ref 39.0–52.0)
Hemoglobin: 8.6 g/dL — ABNORMAL LOW (ref 13.0–17.0)
MCH: 31.5 pg (ref 26.0–34.0)
MCHC: 34.5 g/dL (ref 30.0–36.0)
MCV: 91.2 fL (ref 78.0–100.0)
Platelets: 91 10*3/uL — ABNORMAL LOW (ref 150–400)
RBC: 2.73 MIL/uL — ABNORMAL LOW (ref 4.22–5.81)
RDW: 16.7 % — ABNORMAL HIGH (ref 11.5–15.5)
WBC: 7.7 10*3/uL (ref 4.0–10.5)

## 2012-12-17 LAB — APTT: aPTT: 52 seconds — ABNORMAL HIGH (ref 24–37)

## 2012-12-17 MED ORDER — POTASSIUM CHLORIDE 10 MEQ/50ML IV SOLN
10.0000 meq | INTRAVENOUS | Status: AC
  Administered 2012-12-17 (×3): 10 meq via INTRAVENOUS
  Filled 2012-12-17 (×3): qty 50

## 2012-12-17 MED ORDER — FE FUMARATE-B12-VIT C-FA-IFC PO CAPS
1.0000 | ORAL_CAPSULE | Freq: Three times a day (TID) | ORAL | Status: DC
  Administered 2012-12-17 – 2012-12-26 (×25): 1 via ORAL
  Filled 2012-12-17 (×32): qty 1

## 2012-12-17 MED ORDER — AMIODARONE HCL 200 MG PO TABS
200.0000 mg | ORAL_TABLET | Freq: Two times a day (BID) | ORAL | Status: DC
  Administered 2012-12-17 – 2012-12-26 (×18): 200 mg via ORAL
  Filled 2012-12-17 (×21): qty 1

## 2012-12-17 MED ORDER — PANTOPRAZOLE SODIUM 40 MG PO TBEC
40.0000 mg | DELAYED_RELEASE_TABLET | Freq: Every day | ORAL | Status: DC
  Administered 2012-12-17 – 2012-12-26 (×10): 40 mg via ORAL
  Filled 2012-12-17 (×10): qty 1

## 2012-12-17 NOTE — Progress Notes (Signed)
ANTICOAGULATION CONSULT NOTE - Follow Up Consult  Pharmacy Consult for Bivalirudin Indication: atrial fibrillation, thrombocytopenia  Allergies  Allergen Reactions  . Lisinopril Cough  . Sulfa Drugs Cross Reactors Other (See Comments)    Makes patient feel faint    Patient Measurements: Height: 5\' 10"  (177.8 cm) Weight: 206 lb 5.6 oz (93.6 kg) IBW/kg (Calculated) : 73  Vital Signs: Temp: 97.5 F (36.4 C) (11/30 1240) Temp src: Oral (11/30 1240) BP: 99/60 mmHg (11/30 1400) Pulse Rate: 67 (11/30 1400)  Labs:  Recent Labs  12/15/12 1510 12/16/12 0404  12/16/12 1511 12/16/12 1849 12/17/12 0400  HGB 10.1* 9.8*  --   --   --  8.6*  HCT 29.0* 29.0*  --   --   --  24.9*  PLT 93* 92*  --   --   --  91*  APTT  --   --   < > 60* 58* 52*  CREATININE 1.00 0.94  --  0.87  --  0.84  < > = values in this interval not displayed.  Estimated Creatinine Clearance: 92.6 ml/min (by C-G formula based on Cr of 0.84).   Medications:  Infusions:  . sodium chloride 20 mL/hr at 12/15/12 2100  . amiodarone (NEXTERONE PREMIX) 360 mg/200 mL dextrose 30 mg/hr (12/17/12 1253)  . bivalirudin (ANGIOMAX) infusion 0.5 mg/mL (Non-ACS indications) 0.04 mg/kg/hr (12/16/12 1600)    Assessment: 72 year old male s/p Bentall procedure with bioprosthetic valve on 11/24. He has developed atrial fibrillation and has thrombocytopenia. Bivalirudin was selected for anticoagulation and initiated by Dr. Donata Clay 11/29. He has a slightly lower goal range requested by Dr. Donata Clay.  His PTT remains within the therapeutic range.  Goal of Therapy:  aPTT 50-70 seconds Monitor platelets by anticoagulation protocol: Yes   Plan:  Continue Bivalirudin at 0.04 mg/kg/hr Daily PTT and CBC  Estella Husk, Pharm.D., BCPS, AAHIVP Clinical Pharmacist Phone: [phone] or [phone] 12/17/2012, 2:22 PM

## 2012-12-17 NOTE — Progress Notes (Signed)
Postop day 6 redo biologic-Bentall procedure and removal of [employer]-Hall mechanical valve  Postoperative atrial fibrillation and severe thrombocytopenia on low-dose bivalirudin-today in sinus rhythm on amiodarone  Postoperative ARDS with high oxygen dependence now improving with diuresis and short course of IV steroids-elevated sed rate 75  Retained PA catheter, ligated and divided and placed in subcutaneous pocket sterilely in the OR for potential later removal.  Severe COPD with active smoking up until surgery  Subjective: Patient feels much improved on nasal cannula, able to walk on the hallway with assistance Tolerating regular diet Urine cultures demonstrate Proteus UTI, patient on Elita Quick currently for pulmonary coverage which also covers UTI Neurologic intact Still edematous from long pump run on twice a day IV Lasix  Objective: Vital signs in last 24 hours: Temp:  [97.5 F (36.4 C)-97.9 F (36.6 C)] 97.7 F (36.5 C) (11/30 1606) Pulse Rate:  [67-102] 77 (11/30 1700) Cardiac Rhythm:  [-] Atrial fibrillation (11/30 1600) Resp:  [15-30] 30 (11/30 1700) BP: (90-126)/(53-79) 90/55 mmHg (11/30 1600) SpO2:  [87 %-99 %] 92 % (11/30 1700) Weight:  [206 lb 5.6 oz (93.6 kg)] 206 lb 5.6 oz (93.6 kg) (11/30 0500)  Hemodynamic parameters for last 24 hours: CVP:  [8 mmHg-12 mmHg] 9 mmHg  Intake/Output from previous day: 11/29 0701 - 11/30 0700 In: 1709.4 [I.V.:1109.4; IV Piggyback:600] Out: 1685 [Urine:1685] Intake/Output this shift: Total I/O In: 1549.4 [P.O.:1080; I.V.:369.4; IV Piggyback:100] Out: 765 [Urine:765]  Exam Alert and comfortable taking by mouth's well Lungs with scattered rhonchi Heart rhythm regular murmur Mild peripheral edema Abdomen soft nontender Neuro intact  Lab Results:  Recent Labs  12/16/12 0404 12/17/12 0400  WBC 11.1* 7.7  HGB 9.8* 8.6*  HCT 29.0* 24.9*  PLT 92* 91*   BMET:  Recent Labs  12/16/12 1511 12/17/12 0400  NA 137 139  K  3.3* 3.5  CL 95* 97  CO2 34* 33*  GLUCOSE 142* 106*  BUN 28* 25*  CREATININE 0.87 0.84  CALCIUM 8.7 8.2*    PT/INR: No results found for this basename: LABPROT, INR,  in the last 72 hours ABG    Component Value Date/Time   PHART 7.450 12/14/2012 0823   HCO3 27.7* 12/14/2012 0823   TCO2 29 12/14/2012 0823   ACIDBASEDEF 1.0 12/12/2012 0449   O2SAT 63.5 12/16/2012 0409   CBG (last 3)   Recent Labs  12/17/12 0756 12/17/12 1239 12/17/12 1604  GLUCAP 76 189* 170*    Assessment/Plan: S/P Procedure(s) (LRB): REMOVAL OF CENTRAL LINE ADULT- FROM NECK (Right) Plan Continue low-dose bivalirudin for now because of recurrent atrial fibrillation and severe thrombocytopenia, now improved. Hx he is pending.  Continue physical therapy Pending PICC line placement and DC triple-lumen subclavian central line  Keep in ICU for pulmonary issues and deconditioning   LOS: 9 days    VAN TRIGT III,PETER 12/17/2012

## 2012-12-18 ENCOUNTER — Inpatient Hospital Stay (HOSPITAL_COMMUNITY): Payer: Medicare PPO

## 2012-12-18 ENCOUNTER — Encounter (HOSPITAL_COMMUNITY): Payer: Self-pay | Admitting: Cardiothoracic Surgery

## 2012-12-18 ENCOUNTER — Encounter: Payer: Self-pay | Admitting: *Deleted

## 2012-12-18 LAB — HEPARIN INDUCED THROMBOCYTOPENIA PNL
Heparin Induced Plt Ab: NEGATIVE
Patient O.D.: 0.098
UFH High Dose UFH H: 0 % Release
UFH Low Dose 0.1 IU/mL: 0 % Release
UFH Low Dose 0.5 IU/mL: 0 % Release
UFH SRA Result: NEGATIVE

## 2012-12-18 LAB — CBC
HCT: 27.5 % — ABNORMAL LOW (ref 39.0–52.0)
Hemoglobin: 9.3 g/dL — ABNORMAL LOW (ref 13.0–17.0)
MCH: 31.1 pg (ref 26.0–34.0)
MCHC: 33.8 g/dL (ref 30.0–36.0)
MCV: 92 fL (ref 78.0–100.0)
Platelets: 131 10*3/uL — ABNORMAL LOW (ref 150–400)
RBC: 2.99 MIL/uL — ABNORMAL LOW (ref 4.22–5.81)
RDW: 16.7 % — ABNORMAL HIGH (ref 11.5–15.5)
WBC: 10 10*3/uL (ref 4.0–10.5)

## 2012-12-18 LAB — TYPE AND SCREEN
ABO/RH(D): B POS
Antibody Screen: NEGATIVE
Unit division: 0
Unit division: 0

## 2012-12-18 LAB — COMPREHENSIVE METABOLIC PANEL
ALT: 19 U/L (ref 0–53)
AST: 20 U/L (ref 0–37)
Albumin: 2.3 g/dL — ABNORMAL LOW (ref 3.5–5.2)
Alkaline Phosphatase: 66 U/L (ref 39–117)
BUN: 26 mg/dL — ABNORMAL HIGH (ref 6–23)
CO2: 32 mEq/L (ref 19–32)
Calcium: 8.2 mg/dL — ABNORMAL LOW (ref 8.4–10.5)
Chloride: 97 mEq/L (ref 96–112)
Creatinine, Ser: 0.9 mg/dL (ref 0.50–1.35)
GFR calc Af Amer: >90 mL/min (ref >90)
GFR calc non Af Amer: 84 mL/min — ABNORMAL LOW (ref >90)
Glucose, Bld: 87 mg/dL (ref 70–99)
Potassium: 3 mEq/L — ABNORMAL LOW (ref 3.5–5.1)
Sodium: 139 mEq/L (ref 135–145)
Total Bilirubin: 0.6 mg/dL (ref 0.3–1.2)
Total Protein: 5.3 g/dL — ABNORMAL LOW (ref 6.0–8.3)

## 2012-12-18 LAB — GLUCOSE, CAPILLARY
Glucose-Capillary: 124 mg/dL — ABNORMAL HIGH (ref 70–99)
Glucose-Capillary: 136 mg/dL — ABNORMAL HIGH (ref 70–99)
Glucose-Capillary: 68 mg/dL — ABNORMAL LOW (ref 70–99)
Glucose-Capillary: 69 mg/dL — ABNORMAL LOW (ref 70–99)
Glucose-Capillary: 72 mg/dL (ref 70–99)
Glucose-Capillary: 75 mg/dL (ref 70–99)
Glucose-Capillary: 87 mg/dL (ref 70–99)

## 2012-12-18 LAB — APTT: aPTT: 50 seconds — ABNORMAL HIGH (ref 24–37)

## 2012-12-18 MED ORDER — INSULIN ASPART 100 UNIT/ML ~~LOC~~ SOLN
0.0000 [IU] | Freq: Three times a day (TID) | SUBCUTANEOUS | Status: DC
  Administered 2012-12-18 (×2): 2 [IU] via SUBCUTANEOUS

## 2012-12-18 MED ORDER — POTASSIUM CHLORIDE CRYS ER 20 MEQ PO TBCR
20.0000 meq | EXTENDED_RELEASE_TABLET | ORAL | Status: DC | PRN
  Administered 2012-12-18: 20 meq via ORAL
  Filled 2012-12-18: qty 1

## 2012-12-18 MED ORDER — POTASSIUM CHLORIDE CRYS ER 20 MEQ PO TBCR
40.0000 meq | EXTENDED_RELEASE_TABLET | Freq: Two times a day (BID) | ORAL | Status: DC
  Administered 2012-12-18 – 2012-12-20 (×5): 40 meq via ORAL
  Filled 2012-12-18 (×9): qty 2

## 2012-12-18 MED ORDER — INSULIN DETEMIR 100 UNIT/ML ~~LOC~~ SOLN
10.0000 [IU] | Freq: Two times a day (BID) | SUBCUTANEOUS | Status: DC
  Administered 2012-12-18: 10 [IU] via SUBCUTANEOUS
  Filled 2012-12-18 (×4): qty 0.1

## 2012-12-18 MED ORDER — FUROSEMIDE 10 MG/ML IJ SOLN
40.0000 mg | Freq: Two times a day (BID) | INTRAMUSCULAR | Status: DC
  Administered 2012-12-18 – 2012-12-21 (×7): 40 mg via INTRAVENOUS
  Filled 2012-12-18 (×7): qty 4

## 2012-12-18 MED ORDER — SODIUM CHLORIDE 0.9 % IJ SOLN
10.0000 mL | Freq: Two times a day (BID) | INTRAMUSCULAR | Status: DC
  Administered 2012-12-18 – 2012-12-20 (×5): 10 mL
  Administered 2012-12-21: 20 mL
  Administered 2012-12-22: 10 mL

## 2012-12-18 MED ORDER — BOOST / RESOURCE BREEZE PO LIQD
237.0000 mL | Freq: Three times a day (TID) | ORAL | Status: DC
  Administered 2012-12-18 – 2012-12-25 (×16): 1 via ORAL
  Filled 2012-12-18: qty 1

## 2012-12-18 MED ORDER — SODIUM CHLORIDE 0.9 % IJ SOLN
10.0000 mL | INTRAMUSCULAR | Status: DC | PRN
  Administered 2012-12-21 – 2012-12-23 (×4): 10 mL
  Administered 2012-12-25: 20 mL

## 2012-12-18 MED ORDER — FUROSEMIDE 10 MG/ML IJ SOLN
40.0000 mg | Freq: Two times a day (BID) | INTRAMUSCULAR | Status: DC
  Filled 2012-12-18: qty 4

## 2012-12-18 NOTE — Progress Notes (Signed)
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  Post-op ARDS.   PT Comments   Pt making slow progress.  Will need 24 assistance at DC.  Follow Up Recommendations  Home health PT;Supervision/Assistance - 24 hour     Does the patient have the potential to tolerate intense rehabilitation     Barriers to Discharge        Equipment Recommendations  Other (comment) (TBD)    Recommendations for Other Services OT consult  Frequency Min 3X/week   Progress towards PT Goals Progress towards PT goals: Progressing toward goals  Plan Current plan remains appropriate    Precautions / Restrictions Precautions Precautions: Sternal;Fall   Pertinent Vitals/Pain SaO2 89% on 6L with amb.    Mobility  Bed Mobility Bed Mobility: Sit to Sidelying Left Supine to Sit: 3: Mod assist Sitting - Scoot to Edge of Bed: 3: Mod assist Sit to Sidelying Left: 1: +2 Total assist Sit to Sidelying Left: Patient Percentage: 60% Details for Bed Mobility Assistance: Assist to bring trunk up and verbal cues for sternal precautions. Transfers Sit to Stand: 1: +2 Total assist;Without upper extremity assist;From bed;From chair/3-in-1 Sit to Stand: Patient Percentage: 70% Stand to Sit: 4: Min assist Stand Pivot Transfers: 1: +2 Total assist Stand Pivot Transfers: Patient Percentage: 70% Details for Transfer Assistance: Verbal cues to place hands on knees to encourage use of legs to stand.  Ambulation/Gait Ambulation/Gait Assistance: 1: +2 Total assist Ambulation/Gait: Patient Percentage: 70% Ambulation Distance (Feet): 80 Feet Assistive device: Other (Comment) (pushing w/c) Ambulation/Gait Assistance Details: verbal cues to stand more erect and to take longer  steps. Gait Pattern: Step-through pattern;Decreased step length - right;Decreased step length - left;Trunk flexed;Shuffle Gait velocity: slow General Gait Details: Amb distance limited by incontinent of BM.    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  Post-op ARDS.    Subjective Data      Cognition  Cognition Arousal/Alertness: Awake/alert Behavior During Therapy: WFL for tasks assessed/performed Overall Cognitive Status: Within Functional Limits for tasks assessed    Balance  Static Standing Balance Static Standing - Balance Support: Bilateral upper extremity supported Static Standing - Level of Assistance: 4: Min assist  End of Session PT - End of Session Activity Tolerance: Patient limited by fatigue;Other (comment) (incontinent of BM) Patient left: in bed;with call bell/phone within reach;with nursing/sitter in room Nurse Communication: Mobility status   GP     [facility] 12/18/2012, 3:48 PM  [facility] PT [phone]

## 2012-12-18 NOTE — Progress Notes (Signed)
Inpatient Diabetes Program Recommendations  AACE/ADA: New Consensus Statement on Inpatient Glycemic Control (2013)  Target Ranges:  Prepandial:   less than 140 mg/dL      Peak postprandial:   less than 180 mg/dL (1-2 hours)      Critically ill patients:  140 - 180 mg/dL   Reason for Assessment: Hypoglycemia  Inpatient Diabetes Program Recommendations Insulin - Basal: Currently receiving Levemir 10 units bid Correction (SSI): Currently has orders for CVTS correction tid and HS HgbA1C: 5.4  No history of diabetes Diet: Heart Healthy  Results for [name], [name] (MRN [id number]) as of 12/18/2012 10:50  Ref. Range 12/16/2012 23:34 12/17/2012 03:52 12/17/2012 07:56 12/17/2012 12:39 12/17/2012 16:04 12/17/2012 20:41 12/18/2012 00:07 12/18/2012 00:30 12/18/2012 00:43 12/18/2012 08:14  Glucose-Capillary Latest Range: 70-99 mg/dL 409 (H) 94 76 811 (H) 914 (H) 112 (H) 68 (L) 69 (L) 87 72   Note:  Please consider the following:  Reduce Levemir to once daily or stop   Change correction scale to Sensitive tid with meals with or without HS scale from Glycemic Control Order Set and stop TCTS correction scale. Thank you.  Desiree Daise S. Elsie Lincoln, RN, CNS, CDE Inpatient Diabetes Program, team pager [phone]

## 2012-12-18 NOTE — Progress Notes (Addendum)
TCTS DAILY ICU PROGRESS NOTE                   [address]            [place] [id number]          [phone]   3 Days Post-Op Procedure(s) (LRB): REMOVAL OF CENTRAL LINE ADULT- FROM NECK (Right)  Total Length of Stay:  LOS: 10 days   Subjective: Feels "rough" this am.  Increased cough and wheezing after walking.  Rested poorly. Having loose stools and leaking stool when coughing.    Objective: Vital signs in last 24 hours: Temp:  [97.5 F (36.4 C)-98.2 F (36.8 C)] 98.2 F (36.8 C) (12/01 0400) Pulse Rate:  [67-99] 82 (12/01 0700) Cardiac Rhythm:  [-] Atrial fibrillation (11/30 2100) Resp:  [15-30] 21 (12/01 0700) BP: (90-133)/(55-79) 95/57 mmHg (12/01 0700) SpO2:  [87 %-100 %] 93 % (12/01 0700) Weight:  [209 lb 7 oz (95 kg)] 209 lb 7 oz (95 kg) (12/01 0500)  Filed Weights   12/16/12 0500 12/17/12 0500 12/18/12 0500  Weight: 205 lb 7.5 oz (93.2 kg) 206 lb 5.6 oz (93.6 kg) 209 lb 7 oz (95 kg)    Weight change: 3 lb 1.4 oz (1.4 kg)   Hemodynamic parameters for last 24 hours: CVP:  [9 mmHg-12 mmHg] 12 mmHg  Intake/Output from previous day: 11/30 0701 - 12/01 0700 In: 2752.4 [P.O.:1680; I.V.:872.4; IV Piggyback:200] Out: 1165 [Urine:1165]  CBGs  170-112-87-87-50    Current Meds: Scheduled Meds: . amiodarone  200 mg Oral BID  . bisacodyl  10 mg Oral Daily   Or  . bisacodyl  10 mg Rectal Daily  . budesonide-formoterol  2 puff Inhalation BID  . cefTAZidime (FORTAZ)  IV  1 g Intravenous Q8H  . docusate sodium  200 mg Oral Daily  . feeding supplement (ENSURE COMPLETE)  237 mL Oral TID WC  . ferrous fumarate-b12-vitamic C-folic acid  1 capsule Oral TID PC  . furosemide  20 mg Intravenous BID  . insulin aspart  0-24 Units Subcutaneous Q4H  . insulin detemir  12 Units Subcutaneous BID  . levalbuterol  1.25 mg Nebulization Q6H  . metoprolol tartrate  12.5 mg Oral BID   Or  . metoprolol tartrate  12.5 mg Per Tube BID  . nicotine  14 mg  Transdermal Daily  . pantoprazole  40 mg Oral Daily  . sodium chloride  3 mL Intravenous Q12H  . thiamine  100 mg Intravenous Daily   Continuous Infusions: . sodium chloride 20 mL/hr at 12/17/12 1827  . bivalirudin (ANGIOMAX) infusion 0.5 mg/mL (Non-ACS indications) 0.04 mg/kg/hr (12/17/12 2300)   PRN Meds:.acetaminophen, dextromethorphan-guaiFENesin, HYDROcodone-acetaminophen, metoprolol, ondansetron (ZOFRAN) IV, potassium chloride, sodium chloride, traMADol   Physical Exam: General appearance: alert, cooperative and no distress Heart: regular rate and rhythm Lungs: wheezes throughout Extremities: edema mild BLE Wound: Clean and dry    Lab Results: CBC: Recent Labs  12/17/12 0400 12/18/12 0435  WBC 7.7 10.0  HGB 8.6* 9.3*  HCT 24.9* 27.5*  PLT 91* 131*   BMET:  Recent Labs  12/17/12 0400 12/18/12 0435  NA 139 139  K 3.5 3.0*  CL 97 97  CO2 33* 32  GLUCOSE 106* 87  BUN 25* 26*  CREATININE 0.84 0.90  CALCIUM 8.2* 8.2*    PT/INR: No results found for this basename: LABPROT, INR,  in the last 72 hours Radiology: Dg Chest Port 1 View  12/17/2012   CLINICAL  DATA:  ARDS  EXAM: PORTABLE CHEST - 1 VIEW  COMPARISON:  12/16/2012; 12/15/2012; 12/12/2012; 11/08/2012  FINDINGS: Grossly unchanged enlarged cardiac silhouette and mediastinal contours with atherosclerotic plaque within a tortuous and possibly ectatic thoracic aorta. Post median sternotomy and aortic valve replacement.  Stable positioning of support apparatus including a fractured right jugular approach Swan-Ganz catheter with tip again overlying the expected location of the right main pulmonary artery outflow tract.  Minimally improved aeration of the lungs with persistent rather extensive coarse heterogeneous opacities with relative areas of consolidation within the bilateral mid and lower lungs, right greater than left. Trace right-sided pleural effusion is suspected. No pneumothorax. Unchanged bones. Surgical clips  overlie the right axilla.  IMPRESSION: 1. Stable position of support apparatus including a fractured right jugular approach PA catheter with tip again overlying the expected location of the right pulmonary artery outflow tract. 2. Minimally improved aeration of the lungs with persistent rather extensive mid and lower lung predominant opacities with differential considerations again including asymmetric pulmonary edema, multifocal infection and ARDS. 3. Trace right-sided pleural effusion. Critical Value/emergent results were again called by telephone at the time of interpretation on 12/17/2012 at 7:18 AM to the patient's nurse who verbally acknowledged these results.   Electronically Signed   By: Simonne Come M.D.   On: 12/17/2012 07:21     Assessment/Plan: S/P Procedure(s) (LRB): REMOVAL OF CENTRAL LINE ADULT- FROM NECK (Right)  CV- AF, maintaining SR.  BPs stable.  Continue Amio, Lopressor.  On Angiomax for recurrent AF in light of thrombocytopenia.  ID- On Fortaz D#4 for Proteus UTI/presumed bronchitis.  Pulm- Continue aggressive pulm toilet, nebs.  Has received steroids for postop ARDS.  Vol overload- Continue IV Lasix.  Hypokalemia- replace K+.  Thrombocytopenia- plts recovering.  HIT panel pending.  Continue Angiomax for now.  GI-  Having loose stools, laxatives held yesterday.  Pt states he has chronic bowel issues, and this could be exacerbated by antibiotics.  Continue to hold stool softeners, laxatives and check stool for C diff.  CBGs stable, low at times.  A1C=5.4.  Will decrease Levemir and watch.  Deconditioning- Continue PT.   COLLINS,GINA H 12/18/2012 7:49 AM   Chart reviewed, patient examined, agree with above. His weight is up 3 lbs from yesterday and 18 lbs from preop. Will increase diuretic and continue K+ replacement. CXR is stable. He has wheezes and rhonchi on exam. Ambulating but gets dyspneic easily. Continue bronchodilators, IS.

## 2012-12-18 NOTE — Progress Notes (Addendum)
Hypoglycemic Event  CBG: 68  Treatment: 15 GM carbohydrate snack  Symptoms: None  Follow-up CBG: Time:0028 CBG Result:69  Additional followup 0044, result is 87  Possible Reasons for Event: Inadequate meal intake and Unknown  Comments/MD notified: n/a    Raffi Milstein, Augustina Mood  Remember to initiate Hypoglycemia Order Set & complete

## 2012-12-18 NOTE — Progress Notes (Signed)
ANTICOAGULATION CONSULT NOTE - Follow Up Consult  Pharmacy Consult for Bivalirudin Indication: atrial fibrillation, thrombocytopenia  Allergies  Allergen Reactions  . Lisinopril Cough  . Sulfa Drugs Cross Reactors Other (See Comments)    Makes patient feel faint    Labs:  Recent Labs  12/16/12 0404  12/16/12 1511 12/16/12 1849 12/17/12 0400 12/18/12 0435  HGB 9.8*  --   --   --  8.6* 9.3*  HCT 29.0*  --   --   --  24.9* 27.5*  PLT 92*  --   --   --  91* 131*  APTT  --   < > 60* 58* 52* 50*  CREATININE 0.94  --  0.87  --  0.84 0.90  < > = values in this interval not displayed.  Estimated Creatinine Clearance: 87.1 ml/min (by C-G formula based on Cr of 0.9).   Medications:  Infusions:  . sodium chloride 20 mL/hr at 12/17/12 1827  . 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 He has developed atrial fibrillation and has thrombocytopenia. Bivalirudin was selected for anticoagulation and initiated by Dr. Donata Clay 11/29. He has a slightly lower goal range requested by Dr. Donata Clay.  His PTT remains within the therapeutic range.  Goal of Therapy:  aPTT 50-70 seconds Monitor platelets by anticoagulation protocol: Yes   Plan:  Continue Bivalirudin at 0.04 mg/kg/hr Daily PTT and CBC  Thank you. Okey Regal, PharmD [phone]  12/18/2012, 9:38 AM

## 2012-12-18 NOTE — Progress Notes (Signed)
UR Completed.  Gregory Turner Jane [phone] 02/08/2012  

## 2012-12-18 NOTE — Progress Notes (Signed)
PM ROUNDS  BP 112/67  Pulse 87  Temp(Src) 97.8 F (36.6 C) (Oral)  Resp 30  Ht 5\' 10"  (1.778 m)  Wt 209 lb 7 oz (95 kg)  BMI 30.05 kg/m2  SpO2 94%   Intake/Output Summary (Last 24 hours) at 12/18/12 1808 Last data filed at 12/18/12 1600  Gross per 24 hour  Intake 1822.7 ml  Output    750 ml  Net 1072.7 ml    CBG (last 3)   Recent Labs  12/18/12 0426 12/18/12 0814 12/18/12 1218  GLUCAP 75 72 136*   Levimir held at noon, will hold PM dose as well as PO intake poor

## 2012-12-18 NOTE — Progress Notes (Signed)
Peripherally Inserted Central Catheter/Midline Placement  The IV Nurse has discussed with the patient and/or persons authorized to consent for the patient, the purpose of this procedure and the potential benefits and risks involved with this procedure.  The benefits include less needle sticks, lab draws from the catheter and patient may be discharged home with the catheter.  Risks include, but not limited to, infection, bleeding, blood clot (thrombus formation), and puncture of an artery; nerve damage and irregular heat beat.  Alternatives to this procedure were also discussed.  PICC/Midline Placement Documentation        Lisabeth Devoid 12/18/2012, 11:25 AM Consent obtained by Merleen Milliner, RN, CRNI

## 2012-12-19 ENCOUNTER — Inpatient Hospital Stay (HOSPITAL_COMMUNITY): Payer: Medicare PPO

## 2012-12-19 LAB — COMPREHENSIVE METABOLIC PANEL
ALT: 16 U/L (ref 0–53)
AST: 15 U/L (ref 0–37)
Albumin: 2.1 g/dL — ABNORMAL LOW (ref 3.5–5.2)
Alkaline Phosphatase: 63 U/L (ref 39–117)
BUN: 28 mg/dL — ABNORMAL HIGH (ref 6–23)
CO2: 30 mEq/L (ref 19–32)
Calcium: 7.8 mg/dL — ABNORMAL LOW (ref 8.4–10.5)
Chloride: 96 mEq/L (ref 96–112)
Creatinine, Ser: 0.97 mg/dL (ref 0.50–1.35)
GFR calc Af Amer: >90 mL/min (ref >90)
GFR calc non Af Amer: 81 mL/min — ABNORMAL LOW (ref >90)
Glucose, Bld: 98 mg/dL (ref 70–99)
Potassium: 3.7 mEq/L (ref 3.5–5.1)
Sodium: 135 mEq/L (ref 135–145)
Total Bilirubin: 0.7 mg/dL (ref 0.3–1.2)
Total Protein: 5.1 g/dL — ABNORMAL LOW (ref 6.0–8.3)

## 2012-12-19 LAB — CBC
HCT: 26 % — ABNORMAL LOW (ref 39.0–52.0)
Hemoglobin: 8.4 g/dL — ABNORMAL LOW (ref 13.0–17.0)
MCH: 30.7 pg (ref 26.0–34.0)
MCHC: 32.3 g/dL (ref 30.0–36.0)
MCV: 94.9 fL (ref 78.0–100.0)
Platelets: 150 10*3/uL (ref 150–400)
RBC: 2.74 MIL/uL — ABNORMAL LOW (ref 4.22–5.81)
RDW: 17.2 % — ABNORMAL HIGH (ref 11.5–15.5)
WBC: 11.9 10*3/uL — ABNORMAL HIGH (ref 4.0–10.5)

## 2012-12-19 LAB — GLUCOSE, CAPILLARY
Glucose-Capillary: 138 mg/dL — ABNORMAL HIGH (ref 70–99)
Glucose-Capillary: 96 mg/dL (ref 70–99)
Glucose-Capillary: 112 mg/dL — ABNORMAL HIGH (ref 70–99)
Glucose-Capillary: 112 mg/dL — ABNORMAL HIGH (ref 70–99)

## 2012-12-19 LAB — APTT: aPTT: 61 seconds — ABNORMAL HIGH (ref 24–37)

## 2012-12-19 MED ORDER — POTASSIUM CHLORIDE CRYS ER 20 MEQ PO TBCR
30.0000 meq | EXTENDED_RELEASE_TABLET | Freq: Once | ORAL | Status: AC
  Administered 2012-12-19: 30 meq via ORAL

## 2012-12-19 MED ORDER — METOLAZONE 5 MG PO TABS
5.0000 mg | ORAL_TABLET | Freq: Every day | ORAL | Status: DC
  Administered 2012-12-19 – 2012-12-20 (×2): 5 mg via ORAL
  Filled 2012-12-19 (×3): qty 1

## 2012-12-19 NOTE — Progress Notes (Signed)
ANTICOAGULATION CONSULT NOTE - Follow Up Consult  Pharmacy Consult for Bivalirudin Indication: atrial fibrillation, thrombocytopenia  Allergies  Allergen Reactions  . Lisinopril Cough  . Sulfa Drugs Cross Reactors Other (See Comments)    Makes patient feel faint    Labs:  Recent Labs  12/17/12 0400 12/18/12 0435 12/19/12 0430  HGB 8.6* 9.3* 8.4*  HCT 24.9* 27.5* 26.0*  PLT 91* 131* 150  APTT 52* 50* 61*  CREATININE 0.84 0.90 0.97    Estimated Creatinine Clearance: 80.8 ml/min (by C-G formula based on Cr of 0.97).   Medications:  Infusions:  . sodium chloride 15 mL/hr at 12/19/12 0700  . 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 He has developed atrial fibrillation and has thrombocytopenia. Bivalirudin was selected for anticoagulation and initiated by Dr. Donata Clay 11/29. He has a slightly lower goal range requested by Dr. Donata Clay.  His PTT remains within the therapeutic range.  HIT screen is negative.  Platelets have recovered -- 150 this AM  Goal of Therapy:  aPTT 50-70 seconds Monitor platelets by anticoagulation protocol: Yes   Plan:  Continue Bivalirudin at 0.04 mg/kg/hr Daily PTT and CBC  DC Bivalirudin??  Thank you. Okey Regal, PharmD [phone]  12/19/2012, 8:51 AM

## 2012-12-19 NOTE — Progress Notes (Addendum)
      [address]       [place] [address]             [phone]        4 Days Post-Op Procedure(s) (LRB): REMOVAL OF CENTRAL LINE ADULT- FROM NECK (Right)  Subjective: Patient sitting in chair. Feels "ok". He has already walked this morning. Still has loose stools but according to him, that is about his normal.  Objective: Vital signs in last 24 hours: Temp:  [97.7 F (36.5 C)-98.5 F (36.9 C)] 97.8 F (36.6 C) (12/02 0400) Pulse Rate:  [37-95] 81 (12/02 0600) Cardiac Rhythm:  [-] Junctional rhythm (12/02 0700) Resp:  [15-34] 18 (12/02 0600) BP: (76-128)/(47-76) 89/59 mmHg (12/02 0600) SpO2:  [89 %-95 %] 93 % (12/02 0600)  Pre op weight  87 kg Current Weight  12/18/12 95 kg (209 lb 7 oz)      Intake/Output from previous day: 12/01 0701 - 12/02 0700 In: 1609.2 [P.O.:720; I.V.:789.2; IV Piggyback:100] Out: 835 [Urine:835]   Physical Exam:  Cardiovascular: RRR. Pulmonary: Coarse breath sounds bilaterally Abdomen: Soft, non tender, bowel sounds present. Extremities:Bilateral lower extremity edema. Wounds: Clean and dry.  No erythema or signs of infection.  Lab Results: CBC: Recent Labs  12/18/12 0435 12/19/12 0430  WBC 10.0 11.9*  HGB 9.3* 8.4*  HCT 27.5* 26.0*  PLT 131* 150   BMET:  Recent Labs  12/18/12 0435 12/19/12 0430  NA 139 135  K 3.0* 3.7  CL 97 96  CO2 32 30  GLUCOSE 87 98  BUN 26* 28*  CREATININE 0.90 0.97  CALCIUM 8.2* 7.8*    PT/INR:  Lab Results  Component Value Date   INR 0.95 12/11/2012   INR 0.87 12/11/2012   INR 1.02 12/11/2012   ABG:  INR: Will add last result for INR, ABG once components are confirmed Will add last 4 CBG results once components are confirmed  Assessment/Plan:  1. CV - Previous a fib. SR this am. On Amiodarone 200 bid, Lopressor 12.5 bid. SBP labile this am. Hold Lopressor. On Angiomax as well. 2.  Pulmonary - Post op ARDS. CXR this am appears to show stable, diffuse airspace  disease and no pneumothorax.Encourage incentive spirometer. 3. Volume Overload - On Lasix 40 IV bid. Will diurese as bp allows 4.  Acute blood loss anemia - H and H decreased to 8.4 and 26. Continue Trinsicon 5. Thrombocytopenia resolved-platelets up to 150,000. HIT negative. 6. GI- encourage oral intake. Less loose stools in a patient with chronic bowel difficulties.C dif not collected as urinates when having bowel movement. 7. CBGs 72/136/124. Pre op HGA1C 5.4.On Levemir 10 bid. Will stop for now and cover with SS. 8. Supplement potassium 9. On Fortaz for Proteus UTI, possible bronchitis. 10. Deconditioning-continue with PT  ZIMMERMAN,DONIELLE MPA-C 12/19/2012,8:01 AM   Chart reviewed, patient examined, agree with above. His CXR is stable. He still has positive fluid balance on lasix 40 bid and weight is not decreasing. Wt is 18 lbs over preop and he has moderate bilateral leg edema. Will give him a dose of metolazone. Platelets have continued to rise and HIT panel negative. He is maintaining sinus rhythm. Will stop angiomax.

## 2012-12-19 NOTE — Progress Notes (Signed)
Patient ID: TESLA BOCHICCHIO, male   DOB: 1940-05-07, 72 y.o.   MRN: [id number]   SICU Evening Rounds:   Hemodynamically stable  Ambulated today.   Urine output OK but not diuresing as much as I would expect with normal creatinine. He received a dose of metolzaone today.    CBC    Component Value Date/Time   WBC 11.9* 12/19/2012 0430   WBC 5.9 11/10/2012 1206   RBC 2.74* 12/19/2012 0430   RBC 4.65 11/10/2012 1206   HGB 8.4* 12/19/2012 0430   HCT 26.0* 12/19/2012 0430   PLT 150 12/19/2012 0430   MCV 94.9 12/19/2012 0430   MCH 30.7 12/19/2012 0430   MCH 32.3 11/10/2012 1206   MCHC 32.3 12/19/2012 0430   MCHC 35.0 11/10/2012 1206   RDW 17.2* 12/19/2012 0430   RDW 13.9 11/10/2012 1206   LYMPHSABS 1.6 11/10/2012 1206   EOSABS 0.1 11/10/2012 1206   BASOSABS 0.1 11/10/2012 1206     BMET    Component Value Date/Time   NA 135 12/19/2012 0430   NA 144 11/10/2012 1206   K 3.7 12/19/2012 0430   CL 96 12/19/2012 0430   CO2 30 12/19/2012 0430   GLUCOSE 98 12/19/2012 0430   GLUCOSE 74 11/10/2012 1206   BUN 28* 12/19/2012 0430   BUN 15 11/10/2012 1206   CREATININE 0.97 12/19/2012 0430   CREATININE 1.20 08/02/2012 0740   CALCIUM 7.8* 12/19/2012 0430   GFRNONAA 81* 12/19/2012 0430   GFRAA >90 12/19/2012 0430     A/P:  Stable postop course. Continue current plans

## 2012-12-20 LAB — COMPREHENSIVE METABOLIC PANEL
ALT: 16 U/L (ref 0–53)
AST: 17 U/L (ref 0–37)
Albumin: 2.3 g/dL — ABNORMAL LOW (ref 3.5–5.2)
Alkaline Phosphatase: 72 U/L (ref 39–117)
BUN: 28 mg/dL — ABNORMAL HIGH (ref 6–23)
CO2: 29 mEq/L (ref 19–32)
Calcium: 8.3 mg/dL — ABNORMAL LOW (ref 8.4–10.5)
Chloride: 96 mEq/L (ref 96–112)
Creatinine, Ser: 0.99 mg/dL (ref 0.50–1.35)
GFR calc Af Amer: >90 mL/min (ref >90)
GFR calc non Af Amer: 80 mL/min — ABNORMAL LOW (ref >90)
Glucose, Bld: 139 mg/dL — ABNORMAL HIGH (ref 70–99)
Potassium: 3.8 mEq/L (ref 3.5–5.1)
Sodium: 136 mEq/L (ref 135–145)
Total Bilirubin: 0.9 mg/dL (ref 0.3–1.2)
Total Protein: 5.6 g/dL — ABNORMAL LOW (ref 6.0–8.3)

## 2012-12-20 LAB — CBC
HCT: 26.9 % — ABNORMAL LOW (ref 39.0–52.0)
Hemoglobin: 8.8 g/dL — ABNORMAL LOW (ref 13.0–17.0)
MCH: 31 pg (ref 26.0–34.0)
MCHC: 32.7 g/dL (ref 30.0–36.0)
MCV: 94.7 fL (ref 78.0–100.0)
Platelets: 185 10*3/uL (ref 150–400)
RBC: 2.84 MIL/uL — ABNORMAL LOW (ref 4.22–5.81)
RDW: 16.9 % — ABNORMAL HIGH (ref 11.5–15.5)
WBC: 13.9 10*3/uL — ABNORMAL HIGH (ref 4.0–10.5)

## 2012-12-20 LAB — APTT: aPTT: 41 seconds — ABNORMAL HIGH (ref 24–37)

## 2012-12-20 MED ORDER — POTASSIUM CHLORIDE CRYS ER 20 MEQ PO TBCR: 20.0000 meq | EXTENDED_RELEASE_TABLET | Freq: Once | ORAL | Status: DC

## 2012-12-20 NOTE — Progress Notes (Addendum)
      [address]       [place] [address]             [phone]        5 Days Post-Op Procedure(s) (LRB): REMOVAL OF CENTRAL LINE ADULT- FROM NECK (Right)  Subjective: Patient feels weak. States his breathing is "ok"  Objective: Vital signs in last 24 hours: Temp:  [97.2 F (36.2 C)-98 F (36.7 C)] 97.5 F (36.4 C) (12/03 0730) Pulse Rate:  [73-91] 85 (12/03 0700) Cardiac Rhythm:  [-] Junctional rhythm (12/03 0700) Resp:  [17-30] 23 (12/03 0700) BP: (88-131)/(60-75) 105/60 mmHg (12/03 0700) SpO2:  [90 %-95 %] 93 % (12/03 0700) Weight:  [95.528 kg (210 lb 9.6 oz)] 95.528 kg (210 lb 9.6 oz) (12/03 0500)  Pre op weight  87 kg Current Weight  12/20/12 95.528 kg (210 lb 9.6 oz)      Intake/Output from previous day: 12/02 0701 - 12/03 0700 In: 814 [P.O.:480; I.V.:184; IV Piggyback:150] Out: 750 [Urine:750]   Physical Exam:  Cardiovascular: RRR. Pulmonary: Diminshed breath sounds bilaterally Abdomen: Soft, non tender, bowel sounds present. Extremities:Bilateral lower extremity edema. SCDs in place this am. Wounds: Clean and dry.  No erythema or signs of infection.  Lab Results: CBC:  Recent Labs  12/19/12 0430 12/20/12 0510  WBC 11.9* 13.9*  HGB 8.4* 8.8*  HCT 26.0* 26.9*  PLT 150 185   BMET:   Recent Labs  12/19/12 0430 12/20/12 0510  NA 135 136  K 3.7 3.8  CL 96 96  CO2 30 29  GLUCOSE 98 139*  BUN 28* 28*  CREATININE 0.97 0.99  CALCIUM 7.8* 8.3*    PT/INR:  Lab Results  Component Value Date   INR 0.95 12/11/2012   INR 0.87 12/11/2012   INR 1.02 12/11/2012   ABG:  INR: Will add last result for INR, ABG once components are confirmed Will add last 4 CBG results once components are confirmed  Assessment/Plan:  1. CV - Previous a fib. SR this am. On Amiodarone 200 bid, Lopressor 12.5 bid.  Angiomax was stopped yesterday. 2.  Pulmonary - Post op ARDS.Encourage incentive spirometer. 3. Volume Overload - On Lasix 40  IV bid.Given Zaroxolyn yesterday, but not much diuresis. Will discuss with surgeon 4.  Acute blood loss anemia - H and H increased to 8.8 and 26.9. Continue Trinsicon 5. Thrombocytopenia resolved-platelets up to 185,000. HIT negative. 6. GI- encourage oral intake. No loose stools yesterday in a patient with chronic bowel difficulties. Continue Resource 7. CBGs 96/112/112. Pre op HGA1C 5.4.On Levemir 10 bid. Will stop for now and cover with SS. 8. Supplement potassium 9. On Fortaz for Proteus UTI, possible bronchitis. 10. WBC slightly increased to 13,900. Given steroids previously. No sign of wound infection. 11. Deconditioning-continue with PT  ZIMMERMAN,DONIELLE MPA-C 12/20/2012,8:04 AM    Chart reviewed, patient examined, agree with above. His lungs sound much better today and he ambulated 3 times yesterday and used his flutter valve. He requires a lot of encouragement and insistence to ambulate and sit up in chair.  He made a lot of urine yesterday according to his nurse but can't keep a condom cath on and won't or can't use a urinal so he has been incontinent in the chair a lot. His weight is the same though and still 18 lbs over his preop wt. Will continue diuresis. I think he needs to stay in the ICU today to get adequate attention to mobilize.

## 2012-12-20 NOTE — Progress Notes (Signed)
Patient ID: Gregory Turner, male   DOB: 1940/10/12, 72 y.o.   MRN: [id number] EVENING ROUNDS NOTE :     [address]       [place] [address]             [phone]                 5 Days Post-Op Procedure(s) (LRB): REMOVAL OF CENTRAL LINE ADULT- FROM NECK (Right)  Total Length of Stay:  LOS: 12 days  BP 96/71  Pulse 86  Temp(Src) 97.8 F (36.6 C) (Oral)  Resp 32  Ht 5\' 10"  (1.778 m)  Wt 210 lb 9.6 oz (95.528 kg)  BMI 30.22 kg/m2  SpO2 94%  .Intake/Output     12/02 0701 - 12/03 0700 12/03 0701 - 12/04 0700   P.O. 480 360   I.V. (mL/kg) 184 (1.9)    IV Piggyback 150 50   Total Intake(mL/kg) 814 (8.5) 410 (4.3)   Urine (mL/kg/hr) 750 (0.3)    Total Output 750     Net +64 +410        Urine Occurrence 9 x 5 x     . sodium chloride Stopped (12/19/12 2000)     Lab Results  Component Value Date   WBC 13.9* 12/20/2012   HGB 8.8* 12/20/2012   HCT 26.9* 12/20/2012   PLT 185 12/20/2012   GLUCOSE 139* 12/20/2012   ALT 16 12/20/2012   AST 17 12/20/2012   NA 136 12/20/2012   K 3.8 12/20/2012   CL 96 12/20/2012   CREATININE 0.99 12/20/2012   BUN 28* 12/20/2012   CO2 29 12/20/2012   INR 0.95 12/11/2012   HGBA1C 5.4 12/09/2012   Walked 3 times, breathing better on 3l [place] angiomax plts 185  Delight Ovens MD  Beeper [phone] Office [phone] 12/20/2012 6:26 PM

## 2012-12-20 NOTE — Progress Notes (Signed)
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  Post-op ARDS.   PT Comments   Pt making great progress and able to ambulate using w/c using 6L O2.  Will continue to follow.   Follow Up Recommendations  Home health PT;Supervision/Assistance - 24 hour     Equipment Recommendations  Rolling walker with 5" wheels    Recommendations for Other Services OT consult  Frequency Min 3X/week   Progress towards PT Goals Progress towards PT goals: Progressing toward goals  Plan Current plan remains appropriate    Precautions / Restrictions Precautions Precautions: Sternal;Fall Restrictions Weight Bearing Restrictions: Yes (sternal precautions)   Pertinent Vitals/Pain 6/10 sternal pain; premedicated    Mobility  Bed Mobility Bed Mobility: Sit to Sidelying Left Sit to Sidelying Left: 3: Mod assist Details for Bed Mobility Assistance: (A) to slowly lower trunk and elevate LE into bed.  Transfers Transfers: Sit to Stand;Stand to [employer] Transfers Sit to Stand: 4: Min assist;From bed Stand to Sit: 4: Min assist Details for Transfer Assistance: Verbal cues to place hands on knees to encourage use of legs to stand.  Ambulation/Gait Ambulation/Gait Assistance: 4: Min guard Ambulation Distance (Feet): 100 Feet Assistive device:  (pushing w/c) Ambulation/Gait Assistance Details: Minguard for safety with several standing rest breaks.  Gait Pattern: Step-through pattern;Decreased step length - right;Decreased step length - left;Trunk flexed;Shuffle Gait velocity: slow Stairs: No    Exercises General Exercises - Lower Extremity Ankle Circles/Pumps: Strengthening;Both;10 reps Quad Sets: Strengthening;Both;10 reps;Supine Gluteal  Sets: Strengthening;Both;5 reps;Supine   PT Diagnosis:    PT Problem List:   PT Treatment Interventions:     PT Goals (current goals can now be found in the care plan section) Acute Rehab PT Goals Patient Stated Goal: Return home PT Goal Formulation: With patient Time For Goal Achievement: 12/23/12 Potential to Achieve Goals: Good  Visit Information  Last PT Received On: 12/20/12 Assistance Needed: +1 History of Present Illness: 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  Post-op ARDS.    Subjective Data  Subjective: "I'm trying to do all I can to get out of here.: Patient Stated Goal: Return home   Cognition  Cognition Arousal/Alertness: Awake/alert Behavior During Therapy: WFL for tasks assessed/performed Overall Cognitive Status: Within Functional Limits for tasks assessed    Balance     End of Session PT - End of Session Equipment Utilized During Treatment: Gait belt;Oxygen (6L) Activity Tolerance: Patient limited by fatigue;Other (comment) Patient left: in chair;with call bell/phone within reach;with family/visitor present Nurse Communication: Mobility status   GP     Nickolas Chalfin 12/20/2012, 2:45 PM  Jake Shark, PT DPT [phone]

## 2012-12-21 ENCOUNTER — Inpatient Hospital Stay (HOSPITAL_COMMUNITY): Payer: Medicare PPO

## 2012-12-21 LAB — GLUCOSE, CAPILLARY
Glucose-Capillary: 139 mg/dL — ABNORMAL HIGH (ref 70–99)
Glucose-Capillary: 114 mg/dL — ABNORMAL HIGH (ref 70–99)

## 2012-12-21 LAB — COMPREHENSIVE METABOLIC PANEL
ALT: 16 U/L (ref 0–53)
AST: 22 U/L (ref 0–37)
Albumin: 2.2 g/dL — ABNORMAL LOW (ref 3.5–5.2)
Alkaline Phosphatase: 70 U/L (ref 39–117)
BUN: 25 mg/dL — ABNORMAL HIGH (ref 6–23)
CO2: 29 mEq/L (ref 19–32)
Calcium: 8.3 mg/dL — ABNORMAL LOW (ref 8.4–10.5)
Chloride: 95 mEq/L — ABNORMAL LOW (ref 96–112)
Creatinine, Ser: 1.06 mg/dL (ref 0.50–1.35)
GFR calc Af Amer: 80 mL/min — ABNORMAL LOW (ref >90)
GFR calc non Af Amer: 69 mL/min — ABNORMAL LOW (ref >90)
Glucose, Bld: 106 mg/dL — ABNORMAL HIGH (ref 70–99)
Potassium: 4.4 mEq/L (ref 3.5–5.1)
Sodium: 134 mEq/L — ABNORMAL LOW (ref 135–145)
Total Bilirubin: 0.8 mg/dL (ref 0.3–1.2)
Total Protein: 5.8 g/dL — ABNORMAL LOW (ref 6.0–8.3)

## 2012-12-21 LAB — CBC
HCT: 27.4 % — ABNORMAL LOW (ref 39.0–52.0)
Hemoglobin: 8.9 g/dL — ABNORMAL LOW (ref 13.0–17.0)
MCH: 30.8 pg (ref 26.0–34.0)
MCHC: 32.5 g/dL (ref 30.0–36.0)
MCV: 94.8 fL (ref 78.0–100.0)
Platelets: 185 10*3/uL (ref 150–400)
RBC: 2.89 MIL/uL — ABNORMAL LOW (ref 4.22–5.81)
RDW: 17 % — ABNORMAL HIGH (ref 11.5–15.5)
WBC: 12.4 10*3/uL — ABNORMAL HIGH (ref 4.0–10.5)

## 2012-12-21 MED ORDER — MAGNESIUM HYDROXIDE 400 MG/5ML PO SUSP: 30.0000 mL | Freq: Every day | ORAL | Status: DC | PRN

## 2012-12-21 MED ORDER — BISACODYL 5 MG PO TBEC
10.0000 mg | DELAYED_RELEASE_TABLET | Freq: Every day | ORAL | Status: DC | PRN
  Filled 2012-12-21: qty 2

## 2012-12-21 MED ORDER — SODIUM CHLORIDE 0.9 % IJ SOLN: 3.0000 mL | INTRAMUSCULAR | Status: DC | PRN

## 2012-12-21 MED ORDER — POTASSIUM CHLORIDE CRYS ER 20 MEQ PO TBCR
20.0000 meq | EXTENDED_RELEASE_TABLET | Freq: Every day | ORAL | Status: DC
  Administered 2012-12-21 – 2012-12-26 (×6): 20 meq via ORAL
  Filled 2012-12-21 (×5): qty 1

## 2012-12-21 MED ORDER — FUROSEMIDE 40 MG PO TABS
40.0000 mg | ORAL_TABLET | Freq: Every day | ORAL | Status: DC
  Administered 2012-12-22: 40 mg via ORAL
  Filled 2012-12-21 (×2): qty 1

## 2012-12-21 MED ORDER — METOPROLOL TARTRATE 12.5 MG HALF TABLET
12.5000 mg | ORAL_TABLET | Freq: Every day | ORAL | Status: DC
  Administered 2012-12-21 – 2012-12-26 (×6): 12.5 mg via ORAL
  Filled 2012-12-21 (×6): qty 1

## 2012-12-21 MED ORDER — DOCUSATE SODIUM 100 MG PO CAPS
200.0000 mg | ORAL_CAPSULE | Freq: Every day | ORAL | Status: DC
  Administered 2012-12-22 – 2012-12-24 (×3): 200 mg via ORAL
  Filled 2012-12-21 (×5): qty 2

## 2012-12-21 MED ORDER — VITAMIN B-1 100 MG PO TABS
100.0000 mg | ORAL_TABLET | Freq: Every day | ORAL | Status: DC
  Administered 2012-12-21 – 2012-12-26 (×6): 100 mg via ORAL
  Filled 2012-12-21 (×6): qty 1

## 2012-12-21 MED ORDER — BISACODYL 10 MG RE SUPP: 10.0000 mg | Freq: Every day | RECTAL | Status: DC | PRN

## 2012-12-21 MED ORDER — SODIUM CHLORIDE 0.9 % IV SOLN: 250.0000 mL | INTRAVENOUS | Status: DC | PRN

## 2012-12-21 MED ORDER — SODIUM CHLORIDE 0.9 % IJ SOLN
3.0000 mL | Freq: Two times a day (BID) | INTRAMUSCULAR | Status: DC
  Administered 2012-12-22 – 2012-12-23 (×2): 3 mL via INTRAVENOUS

## 2012-12-21 MED ORDER — INSULIN ASPART 100 UNIT/ML ~~LOC~~ SOLN
0.0000 [IU] | Freq: Three times a day (TID) | SUBCUTANEOUS | Status: DC
  Administered 2012-12-21: 2 [IU] via SUBCUTANEOUS
  Administered 2012-12-23: 4 [IU] via SUBCUTANEOUS
  Administered 2012-12-23: 2 [IU] via SUBCUTANEOUS
  Administered 2012-12-24: 4 [IU] via SUBCUTANEOUS

## 2012-12-21 MED ORDER — MOVING RIGHT ALONG BOOK
Freq: Once | Status: AC
  Administered 2012-12-23: 08:00:00
  Filled 2012-12-21: qty 1

## 2012-12-21 NOTE — Progress Notes (Signed)
6 Days Post-Op Procedure(s) (LRB): REMOVAL OF CENTRAL LINE ADULT- FROM NECK (Right) Subjective: OOB Lungs improved NSR O2 now 3 l/min  Objective: Vital signs in last 24 hours: Temp:  [97.8 F (36.6 C)-98.3 F (36.8 C)] 98.3 F (36.8 C) (12/04 0400) Pulse Rate:  [81-92] 87 (12/04 0700) Cardiac Rhythm:  [-] Junctional rhythm (12/04 0700) Resp:  [14-32] 21 (12/04 0700) BP: (81-120)/(43-82) 90/65 mmHg (12/04 0700) SpO2:  [89 %-99 %] 92 % (12/04 0700) Weight:  [203 lb 6.4 oz (92.262 kg)] 203 lb 6.4 oz (92.262 kg) (12/04 0500)  Hemodynamic parameters for last 24 hours:  stable  Intake/Output from previous day: 12/03 0701 - 12/04 0700 In: 790 [P.O.:600; I.V.:40; IV Piggyback:150] Out: 1325 [Urine:1325] Intake/Output this shift:    Incisions clean 2+ edema  Lab Results:  Recent Labs  12/20/12 0510 12/21/12 0505  WBC 13.9* 12.4*  HGB 8.8* 8.9*  HCT 26.9* 27.4*  PLT 185 185   BMET:  Recent Labs  12/20/12 0510 12/21/12 0505  NA 136 134*  K 3.8 4.4  CL 96 95*  CO2 29 29  GLUCOSE 139* 106*  BUN 28* 25*  CREATININE 0.99 1.06  CALCIUM 8.3* 8.3*    PT/INR: No results found for this basename: LABPROT, INR,  in the last 72 hours ABG    Component Value Date/Time   PHART 7.450 12/14/2012 0823   HCO3 27.7* 12/14/2012 0823   TCO2 29 12/14/2012 0823   ACIDBASEDEF 1.0 12/12/2012 0449   O2SAT 63.5 12/16/2012 0409   CBG (last 3)   Recent Labs  12/19/12 0806 12/19/12 1239 12/19/12 1708  GLUCAP 96 112* 112*    Assessment/Plan: S/P Procedure(s) (LRB): REMOVAL OF CENTRAL LINE ADULT- FROM NECK (Right) Plan for transfer to step-down: see transfer orders   LOS: 13 days    VAN TRIGT III,PETER 12/21/2012

## 2012-12-21 NOTE — Progress Notes (Signed)
PT Cancellation Note  Patient Details Name: IMER FOXWORTH MRN: [id number] DOB: 1940/07/20   Cancelled Treatment:    Reason Eval/Treat Not Completed: Patient at procedure or test/unavailable. Going for post-op CABG class. Will attempt later as schedule allows.   Lilit Cinelli 12/21/2012, 2:28 PM Pager [phone]

## 2012-12-21 NOTE — Progress Notes (Signed)
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  Post-op ARDS.   PT Comments   Pt able to make progress and increase ambulation distance.  Pt able to ambulate with decrease O2 from 6L to 2L.  Pt with mild SOB during ambulation.    Follow Up Recommendations  Home health PT;Supervision/Assistance - 24 hour     Equipment Recommendations  None recommended by PT    Recommendations for Other Services    Frequency Min 3X/week   Progress towards PT Goals    Plan Current plan remains appropriate    Precautions / Restrictions Precautions Precautions: Sternal;Fall Restrictions Weight Bearing Restrictions: Yes (sternal precautions)   Pertinent Vitals/Pain Unable to get read SaO2 during ambulation due to machine.  Pt reports mild sternal/back pain but does not rate    Mobility  Bed Mobility Bed Mobility: Not assessed (Pt sitting in recliner when entering room. ) Sit to Sidelying Left: 4: Min assist;HOB flat Details for Bed Mobility Assistance: (A) with LE into bed with pt holding onto heart pillow to brace Transfers Transfers: Sit to Stand;Stand to Sit Sit to Stand: From bed;4: Min guard Stand to Sit: 4: Min guard;To bed Details for Transfer Assistance: Verbal cues to place hands on knees to encourage use of legs to stand.  Needed several attempts to stand but able to complete with minguard.  Ambulation/Gait Ambulation/Gait Assistance: 4: Min guard Ambulation Distance (Feet): 175 Feet Assistive device: Rolling walker (pushing w/c) Ambulation/Gait Assistance Details: Minguard for safety with cues for upright posture and correct body position within RW.  Gait Pattern: Step-through pattern;Decreased step length - right;Decreased step  length - left;Trunk flexed;Shuffle Gait velocity: slow Stairs: No    Exercises General Exercises - Lower Extremity Ankle Circles/Pumps: Strengthening;Both;10 reps Quad Sets: Strengthening;Both;10 reps;Supine Gluteal Sets: Strengthening;Both;5 reps;Supine   PT Diagnosis:    PT Problem List:   PT Treatment Interventions:     PT Goals (current goals can now be found in the care plan section) Acute Rehab PT Goals Patient Stated Goal: Return home PT Goal Formulation: With patient Time For Goal Achievement: 12/23/12 Potential to Achieve Goals: Good  Visit Information  Last PT Received On: 12/21/12 Assistance Needed: +1 Reason Eval/Treat Not Completed: Patient at procedure or test/unavailable History of Present Illness: 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  Post-op ARDS.    Subjective Data  Subjective: "I'm trying to do all I can to get out of here.: Patient Stated Goal: Return home   Cognition  Cognition Arousal/Alertness: Awake/alert Behavior During Therapy: WFL for tasks assessed/performed Overall Cognitive Status: Within Functional Limits for tasks assessed    Balance     End of Session PT - End of Session Equipment Utilized During Treatment: Gait belt;Oxygen (3L) Activity Tolerance: Patient limited by fatigue;Other (comment) Patient left: with call bell/phone within reach;with family/visitor present;in bed Nurse Communication: Mobility status   GP     Dorma Altman 12/21/2012, 5:25 PM  Jake Shark, PT DPT [phone]

## 2012-12-21 NOTE — Progress Notes (Signed)
CARDIAC REHAB PHASE I   PRE:  Rate/Rhythm: 85SR PACs  BP:  Supine:   Sitting: 110/62  Standing:    SaO2: 93% 3 MODE:  Ambulation: 150 ft   POST:  Rate/Rhythm: 88 SR  BP:  Supine:   Sitting: 106/68  Standing:    SaO2: 88%3L, 94%4L 1410-1455 Pt walked 150 ft on 3L with gait belt use, rolling walker and asst x 2.  To discharge class after walk. Sats at 88% 3L so increased to 4L for class. Sats to 94%. Pt encouraged not to use arms. Tolerated well.    Luetta Nutting, RN BSN  12/21/2012 2:42 PM

## 2012-12-21 NOTE — Progress Notes (Signed)
No blood return noted to either lumen of PICC, both lumen flushed with normal saline, but with difficulty. IV team paged.

## 2012-12-22 ENCOUNTER — Encounter (HOSPITAL_COMMUNITY)
Admission: RE | Disposition: A | Discharge status: Home Health | Payer: Self-pay | Source: Ambulatory Visit | Attending: Cardiothoracic Surgery

## 2012-12-22 ENCOUNTER — Inpatient Hospital Stay (HOSPITAL_COMMUNITY): Payer: Medicare PPO

## 2012-12-22 LAB — CBC
HCT: 28.4 % — ABNORMAL LOW (ref 39.0–52.0)
Hemoglobin: 9.3 g/dL — ABNORMAL LOW (ref 13.0–17.0)
MCH: 31.2 pg (ref 26.0–34.0)
MCHC: 32.7 g/dL (ref 30.0–36.0)
MCV: 95.3 fL (ref 78.0–100.0)
Platelets: 191 10*3/uL (ref 150–400)
RBC: 2.98 MIL/uL — ABNORMAL LOW (ref 4.22–5.81)
RDW: 17.5 % — ABNORMAL HIGH (ref 11.5–15.5)
WBC: 11.7 10*3/uL — ABNORMAL HIGH (ref 4.0–10.5)

## 2012-12-22 LAB — GLUCOSE, CAPILLARY
Glucose-Capillary: 130 mg/dL — ABNORMAL HIGH (ref 70–99)
Glucose-Capillary: 116 mg/dL — ABNORMAL HIGH (ref 70–99)
Glucose-Capillary: 108 mg/dL — ABNORMAL HIGH (ref 70–99)
Glucose-Capillary: 108 mg/dL — ABNORMAL HIGH (ref 70–99)
Glucose-Capillary: 127 mg/dL — ABNORMAL HIGH (ref 70–99)

## 2012-12-22 LAB — COMPREHENSIVE METABOLIC PANEL
ALT: 14 U/L (ref 0–53)
AST: 17 U/L (ref 0–37)
Albumin: 2.3 g/dL — ABNORMAL LOW (ref 3.5–5.2)
Alkaline Phosphatase: 67 U/L (ref 39–117)
BUN: 25 mg/dL — ABNORMAL HIGH (ref 6–23)
CO2: 32 mEq/L (ref 19–32)
Calcium: 8.5 mg/dL (ref 8.4–10.5)
Chloride: 97 mEq/L (ref 96–112)
Creatinine, Ser: 1.12 mg/dL (ref 0.50–1.35)
GFR calc Af Amer: 74 mL/min — ABNORMAL LOW (ref >90)
GFR calc non Af Amer: 64 mL/min — ABNORMAL LOW (ref >90)
Glucose, Bld: 124 mg/dL — ABNORMAL HIGH (ref 70–99)
Potassium: 4.6 mEq/L (ref 3.5–5.1)
Sodium: 136 mEq/L (ref 135–145)
Total Bilirubin: 0.6 mg/dL (ref 0.3–1.2)
Total Protein: 6 g/dL (ref 6.0–8.3)

## 2012-12-22 SURGERY — REDO STERNOTOMY
Anesthesia: General

## 2012-12-22 MED ORDER — ALTEPLASE 2 MG IJ SOLR
2.0000 mg | Freq: Once | INTRAMUSCULAR | Status: AC
  Administered 2012-12-22: 2 mg
  Filled 2012-12-22: qty 2

## 2012-12-22 NOTE — Progress Notes (Signed)
       [address]       ,[place] [address]             [phone]          7 Days Post-Op Procedure(s) (LRB): REMOVAL OF CENTRAL LINE ADULT- FROM NECK (Right)  Subjective: Just back from X-ray.  Breathing stable, still with some cough.  No complaints.   Objective: Vital signs in last 24 hours: Patient Vitals for the past 24 hrs:  BP Temp Temp src Pulse Resp SpO2 Weight  12/22/12 0756 - - - - - 93 % -  12/22/12 0434 104/68 mmHg 98.3 F (36.8 C) Oral 77 18 95 % 201 lb 1 oz (91.2 kg)  12/22/12 0156 - - - - - 93 % -  12/21/12 2106 - - - - - 92 % -  12/21/12 1954 108/72 mmHg 98.1 F (36.7 C) Oral 77 18 92 % -  12/21/12 1415 110/68 mmHg - - 82 - 95 % -  12/21/12 1021 115/71 mmHg 97.5 F (36.4 C) Oral 95 20 95 % -  12/21/12 0945 - - - - - 95 % -   Current Weight  12/22/12 201 lb 1 oz (91.2 kg)     Intake/Output from previous day: 12/04 0701 - 12/05 0700 In: 860 [P.O.:840; I.V.:20] Out: 1050 [Urine:1050]  CBGs 114-130-116-124   PHYSICAL EXAM:  Heart: RRR Lungs: Coarse rhonchi bilaterally Wound: Clean and dry Extremities: Mild LE edema    Lab Results: CBC: Recent Labs  12/21/12 0505 12/22/12 0750  WBC 12.4* 11.7*  HGB 8.9* 9.3*  HCT 27.4* 28.4*  PLT 185 191   BMET:  Recent Labs  12/21/12 0505 12/22/12 0750  NA 134* 136  K 4.4 4.6  CL 95* 97  CO2 29 32  GLUCOSE 106* 124*  BUN 25* 25*  CREATININE 1.06 1.12  CALCIUM 8.3* 8.5    PT/INR: No results found for this basename: LABPROT, INR,  in the last 72 hours  CXR: FINDINGS:  Enlargement of cardiac silhouette post median sternotomy and AVR.  With left arm PICC line tip projects over SVC.  Swan-Ganz catheter fragment again identified with tip projecting  over right pulmonary artery at hilum.  Enlargement of cardiac silhouette post median sternotomy and AVR.  Pulmonary vascular congestion.  Atherosclerotic calcification aorta.  Underlying COPD changes with superimposed  bilateral per lobe  infiltrate and minimal atelectasis.  Persistent right basilar pleural effusion atelectasis, slightly  increased.  No pneumothorax.  IMPRESSION:  Slightly increased right pleural effusion and basilar atelectasis.  Persistent upper lobe infiltrates and minimal atelectasis.   Assessment/Plan: S/P Procedure(s) (LRB): REMOVAL OF CENTRAL LINE ADULT- FROM NECK (Right)  CV- AF, now maintaining SR. Continue Amio, Lopressor.  BPs stable.  Vol overload- Continue diuresis.     Increased R effusion- watch.  May need thoracentesis since he still has increased O2 requirements.  Elevated CBGs- sugars stable.  Continue SSI.  ID- D#8 Elita Quick for Proteus UTI/presumed bronchitis.  WBC stable and afebrile.  ?can we d/c abx soon.  Thrombocytopenia- plts stable. HIT negative.  Expected postop blood loss anemia- H/H improved.  Continue Trinsicon.  Deconditioning- PT/CRPI.    LOS: 14 days    Glendale Youngblood H 12/22/2012

## 2012-12-22 NOTE — Progress Notes (Signed)
Physical medicine and rehabilitation consult requested with chart reviewed. Patient participating nicely with therapies endurance and strength continues to improve. Presently ambulating 175 feet minimal assistance pushing a wheelchair as well as less demand for oxygen. Recommendations have been made by therapy team for home health therapies. Will hold on rehabilitation consult at this time with recommendations of home health therapies

## 2012-12-22 NOTE — Progress Notes (Signed)
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  Post-op ARDS.   PT Comments   Pt limited this session due to overall fatigue with ambulation.  Pt continues to need 3L O2 during ambulation.  Educated pt on energy conservation.    Follow Up Recommendations  Home health PT;Supervision/Assistance - 24 hour     Equipment Recommendations  None recommended by PT    Frequency Min 3X/week   Progress towards PT Goals Progress towards PT goals: Progressing toward goals  Plan Current plan remains appropriate    Precautions / Restrictions Precautions Precautions: Sternal;Fall Restrictions Weight Bearing Restrictions: Yes (sternal precautions)   Pertinent Vitals/Pain No reports of pain    Mobility  Bed Mobility Bed Mobility: Not assessed Transfers Transfers: Sit to Stand;Stand to Sit Sit to Stand: From bed;4: Min guard Stand to Sit: 4: Min guard;To bed Details for Transfer Assistance: Verbal cues to place hands on knees to encourage use of legs to stand.  Needed several attempts to stand but able to complete with minguard.  Ambulation/Gait Ambulation/Gait Assistance: 4: Min guard Ambulation Distance (Feet): 175 Feet Assistive device:  (pushing w/c) Ambulation/Gait Assistance Details: Minguard for safety with 3 standing rest breaks.  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  Post-op ARDS.    Subjective Data  Subjective: "I'm trying to do all I can to get out of here.: Patient Stated Goal: Return home   Cognition  Cognition Arousal/Alertness: Awake/alert Behavior During Therapy: WFL for tasks assessed/performed Overall Cognitive Status: Within Functional Limits for tasks assessed    Balance  Balance Balance Assessed: Yes Static Sitting Balance Static Sitting - Balance Support: Bilateral upper extremity supported Static Sitting - Level of Assistance: 5: Stand by assistance Static Standing Balance Static Standing - Balance Support: Bilateral upper extremity supported  End of Session PT - End of Session Equipment Utilized During Treatment: Gait belt;Oxygen (2L-3L) Activity Tolerance: Patient limited by fatigue;Other (comment) Patient left: with call bell/phone within reach;with family/visitor present;in bed Nurse Communication: Mobility status   GP     Chrystina Naff 12/22/2012, 4:46 PM Jake Shark, PT DPT [phone]

## 2012-12-22 NOTE — Progress Notes (Signed)
CARDIAC REHAB PHASE I   PRE:  Rate/Rhythm: 78 SR  BP:  Supine:   Sitting: 102/68  Standing:    SaO2: 90-92 3L  MODE:  Ambulation: 350 ft   POST:  Rate/Rhythm: 95 SR  BP:  Supine:   Sitting: 100/62  Standing:    SaO2: 93 4L 1015-1100 Assisted X 2 used walker, gait belt and O2 4L to ambulate. Gait steady with walker. Pt does have some DOE, but does not c/o of it. He was able to walk 350 feet. Pt to recliner after walk with call light in reach and wife present. Discussed smoking cessation with pt and wife. Pt quit recently, his wife is still smoking. We gave her tips for quitting and coaching contact number.  Melina Copa RN 12/22/2012 10:55 AM

## 2012-12-23 ENCOUNTER — Inpatient Hospital Stay (HOSPITAL_COMMUNITY): Payer: Medicare PPO

## 2012-12-23 LAB — GLUCOSE, CAPILLARY
Glucose-Capillary: 104 mg/dL — ABNORMAL HIGH (ref 70–99)
Glucose-Capillary: 142 mg/dL — ABNORMAL HIGH (ref 70–99)
Glucose-Capillary: 183 mg/dL — ABNORMAL HIGH (ref 70–99)

## 2012-12-23 LAB — COMPREHENSIVE METABOLIC PANEL
ALT: 13 U/L (ref 0–53)
AST: 17 U/L (ref 0–37)
Albumin: 2.3 g/dL — ABNORMAL LOW (ref 3.5–5.2)
Alkaline Phosphatase: 62 U/L (ref 39–117)
BUN: 26 mg/dL — ABNORMAL HIGH (ref 6–23)
CO2: 29 mEq/L (ref 19–32)
Calcium: 8.3 mg/dL — ABNORMAL LOW (ref 8.4–10.5)
Chloride: 96 mEq/L (ref 96–112)
Creatinine, Ser: 1.04 mg/dL (ref 0.50–1.35)
GFR calc Af Amer: 81 mL/min — ABNORMAL LOW (ref >90)
GFR calc non Af Amer: 70 mL/min — ABNORMAL LOW (ref >90)
Glucose, Bld: 115 mg/dL — ABNORMAL HIGH (ref 70–99)
Potassium: 3.9 mEq/L (ref 3.5–5.1)
Sodium: 135 mEq/L (ref 135–145)
Total Bilirubin: 0.7 mg/dL (ref 0.3–1.2)
Total Protein: 5.8 g/dL — ABNORMAL LOW (ref 6.0–8.3)

## 2012-12-23 LAB — CBC
HCT: 27.6 % — ABNORMAL LOW (ref 39.0–52.0)
Hemoglobin: 8.7 g/dL — ABNORMAL LOW (ref 13.0–17.0)
MCH: 30.5 pg (ref 26.0–34.0)
MCHC: 31.5 g/dL (ref 30.0–36.0)
MCV: 96.8 fL (ref 78.0–100.0)
Platelets: 210 10*3/uL (ref 150–400)
RBC: 2.85 MIL/uL — ABNORMAL LOW (ref 4.22–5.81)
RDW: 17.3 % — ABNORMAL HIGH (ref 11.5–15.5)
WBC: 10.7 10*3/uL — ABNORMAL HIGH (ref 4.0–10.5)

## 2012-12-23 LAB — PRO B NATRIURETIC PEPTIDE: Pro B Natriuretic peptide (BNP): 1834 pg/mL — ABNORMAL HIGH (ref 0–125)

## 2012-12-23 MED ORDER — FUROSEMIDE 10 MG/ML IJ SOLN: 40.0000 mg | Freq: Two times a day (BID) | INTRAMUSCULAR | Status: DC

## 2012-12-23 MED ORDER — LEVALBUTEROL HCL 1.25 MG/0.5ML IN NEBU
1.2500 mg | INHALATION_SOLUTION | Freq: Four times a day (QID) | RESPIRATORY_TRACT | Status: DC
  Administered 2012-12-23 – 2012-12-24 (×2): 1.25 mg via RESPIRATORY_TRACT
  Filled 2012-12-23 (×6): qty 0.5

## 2012-12-23 MED ORDER — FUROSEMIDE 40 MG PO TABS
40.0000 mg | ORAL_TABLET | Freq: Every day | ORAL | Status: DC
  Filled 2012-12-23: qty 1

## 2012-12-23 MED ORDER — FUROSEMIDE 10 MG/ML IJ SOLN
40.0000 mg | Freq: Two times a day (BID) | INTRAMUSCULAR | Status: AC
  Administered 2012-12-23 (×2): 40 mg via INTRAVENOUS
  Filled 2012-12-23 (×2): qty 4

## 2012-12-23 NOTE — Progress Notes (Addendum)
CARDIAC REHAB PHASE I   PRE:  Rate/Rhythm: 79 SR  BP:  Sitting: 103/64     SaO2: 95 3L  MODE:  Ambulation: 400 ft   POST:  Rate/Rhythm: 85 SR  BP:  Sitting: 120/70    SaO2: 95 2L  Pt walked 400 ft with light assist x1 with RW and on 2L during ambulation.  Pts SaO2 remained stable and >93% during ambulation.  Pt c/o of mild SOB and no rest breaks.  Pt had a slow, but steady gait.  Reviewed sternal precautions, IS and an order was placed for CRP II in [place].  Returned pt to recliner with call button in lap and IS by his side. [phone]  Marvene Staff MS, ACSM RCEP 10:25 AM 12/23/2012

## 2012-12-23 NOTE — Progress Notes (Addendum)
       [address]       [place] [address]             [phone]          8 Days Post-Op Procedure(s) (LRB): REMOVAL OF CENTRAL LINE ADULT- FROM NECK (Right)  Subjective: Complaining of increased swelling in legs this am.  Breathing subjectively improving.  Appetite good.   Objective: Vital signs in last 24 hours: Patient Vitals for the past 24 hrs:  BP Temp Temp src Pulse Resp SpO2 Weight  12/23/12 0343 108/71 mmHg 98 F (36.7 C) Oral 82 18 95 % 199 lb 4.7 oz (90.4 kg)  12/22/12 2021 104/68 mmHg 97.8 F (36.6 C) Axillary 73 18 96 % -  12/22/12 2013 - - - - - 95 % -  12/22/12 1521 98/65 mmHg 97.9 F (36.6 C) Oral 75 18 95 % -   Current Weight  12/23/12 199 lb 4.7 oz (90.4 kg)   PRE-OPERATIVE WEIGHT: 87kg   Intake/Output from previous day: 12/05 0701 - 12/06 0700 In: 600 [P.O.:600] Out: 250 [Urine:250]  CBGs 108-127-115-104   PHYSICAL EXAM:  Heart: RRR Lungs:Crackles in R base, scattered rhonchi bilaterally Wound: Clean and dry Extremities: 3+ bilateral LE edema    Lab Results: CBC: Recent Labs  12/22/12 0750 12/23/12 0515  WBC 11.7* 10.7*  HGB 9.3* 8.7*  HCT 28.4* 27.6*  PLT 191 210   BMET:  Recent Labs  12/22/12 0750 12/23/12 0515  NA 136 135  K 4.6 3.9  CL 97 96  CO2 32 29  GLUCOSE 124* 115*  BUN 25* 26*  CREATININE 1.12 1.04  CALCIUM 8.5 8.3*    PT/INR: No results found for this basename: LABPROT, INR,  in the last 72 hours    Assessment/Plan: S/P Procedure(s) (LRB): REMOVAL OF CENTRAL LINE ADULT- FROM NECK (Right) CV- AF, now maintaining SR. Continue Amio, Lopressor. BPs stable.  Vol overload- UOP low over past 24 hrs and pt significantly more edematous today.  Will give Lasix IV today, check pro BNP and watch. Increased R effusion- Repeat CXR this am.  May need thoracentesis if not improving. Elevated CBGs- sugars stable. Continue SSI.  ID- D#9 Elita Quick for Proteus UTI/presumed bronchitis. WBC stable and  afebrile. D/c abx. Expected postop blood loss anemia- H/H improved. Continue Trinsicon.  Deconditioning- PT/CRPI.    LOS: 15 days    COLLINS,GINA H 12/23/2012  Patient seen and examined Has 3+ peripheral edema Will order TED hose, IV diuresis CXR shows a small right effusion

## 2012-12-23 NOTE — Progress Notes (Signed)
Patient ambulated approximately 400 ft this morning with RW and RN on 3L O2 and tolerated fairly well.  Stopped a few times to rest but stated he did not feel SOB.  HR stable.  O2 sats 93-95%, will start weaning O2.  Left in room with call bell within reach.  Will continue to monitor.  Gregory Turner

## 2012-12-24 LAB — GLUCOSE, CAPILLARY
Glucose-Capillary: 90 mg/dL (ref 70–99)
Glucose-Capillary: 91 mg/dL (ref 70–99)
Glucose-Capillary: 179 mg/dL — ABNORMAL HIGH (ref 70–99)
Glucose-Capillary: 115 mg/dL — ABNORMAL HIGH (ref 70–99)
Glucose-Capillary: 90 mg/dL (ref 70–99)

## 2012-12-24 LAB — BASIC METABOLIC PANEL
BUN: 25 mg/dL — ABNORMAL HIGH (ref 6–23)
CO2: 31 mEq/L (ref 19–32)
Calcium: 8.2 mg/dL — ABNORMAL LOW (ref 8.4–10.5)
Creatinine, Ser: 1.05 mg/dL (ref 0.50–1.35)
GFR calc Af Amer: 80 mL/min — ABNORMAL LOW (ref >90)
Sodium: 138 mEq/L (ref 135–145)

## 2012-12-24 MED ORDER — FUROSEMIDE 40 MG PO TABS
40.0000 mg | ORAL_TABLET | Freq: Every day | ORAL | Status: DC
  Administered 2012-12-25 – 2012-12-26 (×2): 40 mg via ORAL
  Filled 2012-12-24 (×2): qty 1

## 2012-12-24 MED ORDER — FUROSEMIDE 10 MG/ML IJ SOLN
40.0000 mg | Freq: Once | INTRAMUSCULAR | Status: AC
  Administered 2012-12-24: 40 mg via INTRAVENOUS

## 2012-12-24 NOTE — Progress Notes (Signed)
Pt ambulated approx. 400 ft with O2 and rolling walker. Tolerated without shob or increased pain.  Pt not sleeping consistently.  Assisted to chair with feet elevated.

## 2012-12-24 NOTE — Progress Notes (Addendum)
       [address]       [place] [address]             [phone]          9 Days Post-Op Procedure(s) (LRB): REMOVAL OF CENTRAL LINE ADULT- FROM NECK (Right)  Subjective: Feels better today.  "Can I go home?" Breathing stable, less coughing. Clear sputum.    Objective: Vital signs in last 24 hours: Patient Vitals for the past 24 hrs:  BP Temp Temp src Pulse Resp SpO2 Weight  12/24/12 0420 139/71 mmHg 98.7 F (37.1 C) Oral 76 18 96 % 196 lb 3.4 oz (89 kg)  12/23/12 2159 105/66 mmHg 98 F (36.7 C) Oral 77 18 92 % -  12/23/12 2114 - - - - - 90 % -  12/23/12 1428 - - - - - 90 % -  12/23/12 1300 111/72 mmHg 98.3 F (36.8 C) Oral 70 19 92 % -  12/23/12 1054 102/66 mmHg - - 78 - - -   Current Weight  12/24/12 196 lb 3.4 oz (89 kg)  PRE-OPERATIVE WEIGHT: 87kg    Intake/Output from previous day: 12/06 0701 - 12/07 0700 In: 900 [P.O.:600; IV Piggyback:300] Out: 2600 [Urine:2600]  CBGs 183-90-98-91   PHYSICAL EXAM:  Heart: RRR Lungs: Few coarse rhonchi, scattered wheezes bilaterally Wound: Clean and dry Extremities: 1-2+ LE edema    Lab Results: CBC: Recent Labs  12/22/12 0750 12/23/12 0515  WBC 11.7* 10.7*  HGB 9.3* 8.7*  HCT 28.4* 27.6*  PLT 191 210   BMET:  Recent Labs  12/23/12 0515 12/24/12 0452  NA 135 138  K 3.9 4.0  CL 96 97  CO2 29 31  GLUCOSE 115* 98  BUN 26* 25*  CREATININE 1.04 1.05  CALCIUM 8.3* 8.2*    PT/INR: No results found for this basename: LABPROT, INR,  in the last 72 hours  Pro-BNP 1834   Assessment/Plan: S/P Procedure(s) (LRB): REMOVAL OF CENTRAL LINE ADULT- FROM NECK (Right)  CV- AF, now maintaining SR. Continue Amio, Lopressor. BPs stable.   Vol overload- UOP excellent with decreased edema on exam.  Will give Lasix IV again today and watch.  R effusion- Stable.  Continue diuresis.  Continue CRPI, pulm toilet, try to wean O2.  May eventually need home O2.    LOS: 16 days     COLLINS,GINA H 12/24/2012  Patient seen and examined, agree with above

## 2012-12-25 LAB — BASIC METABOLIC PANEL
CO2: 30 mEq/L (ref 19–32)
Calcium: 8.5 mg/dL (ref 8.4–10.5)
Creatinine, Ser: 0.96 mg/dL (ref 0.50–1.35)
GFR calc Af Amer: >90 mL/min (ref >90)
GFR calc non Af Amer: 81 mL/min — ABNORMAL LOW (ref >90)
Glucose, Bld: 93 mg/dL (ref 70–99)
Potassium: 4.3 mEq/L (ref 3.5–5.1)
Sodium: 137 mEq/L (ref 135–145)

## 2012-12-25 LAB — GLUCOSE, CAPILLARY: Glucose-Capillary: 85 mg/dL (ref 70–99)

## 2012-12-25 NOTE — Progress Notes (Addendum)
      [address]       [place] [address]             [phone]        10 Days Post-Op Procedure(s) (LRB): REMOVAL OF CENTRAL LINE ADULT- FROM NECK (Right)  Subjective: Patient sitting in chair. No specific complaints. He really wants to go home.  Objective: Vital signs in last 24 hours: Temp:  [97.7 F (36.5 C)-98.5 F (36.9 C)] 98 F (36.7 C) (12/08 0442) Pulse Rate:  [73-77] 73 (12/08 0442) Cardiac Rhythm:  [-] Normal sinus rhythm (12/07 2039) Resp:  [18-19] 18 (12/08 0442) BP: (94-118)/(61-75) 102/65 mmHg (12/08 0442) SpO2:  [94 %-96 %] 94 % (12/08 0442) Weight:  [87.7 kg (193 lb 5.5 oz)] 87.7 kg (193 lb 5.5 oz) (12/08 0442)  Pre op weight  87 kg Current Weight  12/25/12 87.7 kg (193 lb 5.5 oz)      Intake/Output from previous day: 12/07 0701 - 12/08 0700 In: 920 [P.O.:920] Out: 1750 [Urine:1750]   Physical Exam:  Cardiovascular: RRR. Pulmonary: Some expiratory wheezing Abdomen: Soft, non tender, bowel sounds present. Extremities:Trace bilateral lower extremity edema. SCDs in place this am. Wounds: Clean and dry.  No erythema or signs of infection.  Lab Results: CBC:  Recent Labs  12/23/12 0515  WBC 10.7*  HGB 8.7*  HCT 27.6*  PLT 210   BMET:   Recent Labs  12/24/12 0452 12/25/12 0500  NA 138 137  K 4.0 4.3  CL 97 100  CO2 31 30  GLUCOSE 98 93  BUN 25* 23  CREATININE 1.05 0.96  CALCIUM 8.2* 8.5    PT/INR:  Lab Results  Component Value Date   INR 0.95 12/11/2012   INR 0.87 12/11/2012   INR 1.02 12/11/2012   ABG:  INR: Will add last result for INR, ABG once components are confirmed Will add last 4 CBG results once components are confirmed  Assessment/Plan:  1. CV - Previous a fib. SR this am. On Amiodarone 200 bid, Lopressor 12.5 daily.   2.  Pulmonary - Post op ARDS.Will likely require oxygen via [place] at discharge.Hope to wean to 2 liters via [place] today.Encourage incentive spirometer. 3. Volume Overload -  On Lasix 40 daily. Was given Lasix IV yesterday with good diuresis. 4.  Acute blood loss anemia - Last H and H to 8.7 and 27.6. Continue Trinsicon 5. Thrombocytopenia resolved-platelets up to 210,000. HIT negative. 6. CBGs 115/90/85. Pre op HGA1C 5.4. Stop accu checks and SS 7. Deconditioning-continue with PT. Continues to make steady progress. 8. Will discuss with surgeon if able to remove every other staple from wounds 9. Remove EPW in am 10. Will discuss with surgeon, if when ready, patient to go home vs SNF  ZIMMERMAN,DONIELLE MPA-C 12/25/2012,8:10 AM    Home tomorrow on home O2 Discussed retained PA cath with IR- will wait 3 months postop op for transvascular extraction  patient examined and medical record reviewed,agree with above note. VAN TRIGT III,PETER 12/25/2012

## 2012-12-25 NOTE — Progress Notes (Signed)
CARDIAC REHAB PHASE I   PRE:  Rate/Rhythm: 72 SR  BP:  Supine:   Sitting: 98/60  Standing:    SaO2: 97 3L 93 RA  MODE:  Ambulation: 550 ft   POST:  Rate/Rhythm: 88 SR  BP:  Supine:   Sitting: 114/70  Standing:   SaO2: 94 RA 0820-0905 Assisted X 1 and used walker to ambulate. Gait steady with walker. Pt able to walk 550 feet without O2. Room air sats 89-91% during walk. Pt has some DOE but pt without c/o. Pt back to recliner after walk with call light in reach. O2 left off after walk, reported to PA and RN.  Melina Copa RN 12/25/2012 9:07 AM

## 2012-12-25 NOTE — Progress Notes (Signed)
Chaplain visited with the patient today and offered emotional and spiritual support. The patient informed the Chaplain that he was intially upset that he was not released from the hospital today because he wanted to go home to be with the rest of his family. Although the patient did not go home today, he remained positive about his health challenges and said "he believes he will go home soon". The patient expressed a lot of joy and happiness that the Chaplain came by and spent time with him today during his most difficult moments. The patient asked if the Chaplain would pray for him and the Chaplain did so.  Chaplain Bryson Ha Holdyn Poyser

## 2012-12-25 NOTE — Progress Notes (Signed)
7 staples removed from R chest incision; 2 sutures removed from MSI; 2 sutures removed from R neck incision; pt tolerated well; all done per Doree Fudge, PA.

## 2012-12-25 NOTE — Progress Notes (Signed)
Physical Therapy Treatment Patient Details Name: Gregory Turner MRN: [id number] DOB: 1940/05/14 Today's Date: 12/25/2012 Time: 6644-0347 PT Time Calculation (min): 23 min  PT Assessment / Plan / Recommendation  History of Present Illness 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  Post-op ARDS.   PT Comments   Progressing nicely and pt hopeful to d/c home tomorrow.  Only one step to enter so pt declined need for practice.  Able to demo appropriate sternal precautions.  Follow Up Recommendations  Home health PT;Supervision/Assistance - 24 hour     Does the patient have the potential to tolerate intense rehabilitation   N/A  Barriers to Discharge  None      Equipment Recommendations  None recommended by PT    Recommendations for Other Services  None  Frequency Min 3X/week   Progress towards PT Goals Progress towards PT goals: Progressing toward goals  Plan Current plan remains appropriate    Precautions / Restrictions Precautions Precautions: Sternal;Fall   Pertinent Vitals/Pain Denies pain    Mobility  Bed Mobility Bed Mobility: Not assessed Transfers Sit to Stand: From chair/3-in-1;5: Supervision Stand to Sit: 5: Supervision;To chair/3-in-1 Details for Transfer Assistance: uses momentum to stand without UE assist due to sternal precautions Ambulation/Gait Ambulation/Gait Assistance: 5: Supervision Ambulation Distance (Feet): 350 Feet Assistive device: Rolling walker Ambulation/Gait Assistance Details: min cues for safety with turns, at times picks up walker to turn; cues for posture Gait Pattern: Step-through pattern;Trunk flexed      PT Goals (current goals can now be found in the care plan section) Acute Rehab PT Goals Time For Goal Achievement: 12/29/12 Potential to Achieve Goals: Good  Visit Information  Last PT Received On: 12/25/12 Assistance Needed: +1 History of Present Illness: Pt underwent re-do AVR and repair of thoracic aortic  aneurysm.  Post-op ARDS.    Subjective Data      Cognition  Cognition Arousal/Alertness: Awake/alert Behavior During Therapy: WFL for tasks assessed/performed Overall Cognitive Status: Within Functional Limits for tasks assessed    Balance  Static Sitting Balance Static Sitting - Balance Support: No upper extremity supported Static Sitting - Level of Assistance: 5: Stand by assistance  End of Session PT - End of Session Equipment Utilized During Treatment: Gait belt Activity Tolerance: Patient tolerated treatment well Patient left: in chair;with call bell/phone within reach;with family/visitor present   GP     [facility] 12/25/2012, 4:58 PM Sheran Lawless, PT [phone] 12/25/2012

## 2012-12-25 NOTE — Progress Notes (Signed)
Pt ambulated 350 ft with O2 and rolling walker. Pt took one standing break, otherwise her tolerated it well.

## 2012-12-25 NOTE — Progress Notes (Signed)
Pt ambulated 550 feet with rolling walker and RN; pt back to room to chair at this time; call bell w/i reach; O2 sats 95% RA upon completion of ambulation; will cont. To monitor.

## 2012-12-25 NOTE — Progress Notes (Signed)
Ambulated 300 ft using rolling walker on room air tolerated well,O2 saturation post ambulation is 92%.

## 2012-12-25 NOTE — Discharge Summary (Signed)
[address]       [place] [address]             [phone]              Discharge Summary  Name: Gregory Turner DOB: February 17, 1940 72 y.o. 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 HTN (hypertension)   . COPD (chronic obstructive pulmonary disease)   . Bronchitis   . Afib   . H/O echocardiogram 04/2011  . 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 Shortness of breath     walks 1/2 mile every evening, /w some SOB  . Blood transfusion     many yrs. ago  . Arthritis     shoulders, back & hips   . Hernia, inguinal     will need surgery on this in the future   . AAA (abdominal aortic aneurysm)       Procedures: 1. REDO AORTIC VALVE REPLACEMENT WITH BIOLOGICAL BENTALL ROOT REPLACEMENT (27 mm Edwards pericardial tissue valve, 30 mm Gelweave Valsalva graft) 2. RESECTION AND GRAFTING OF ASCENDING THORACIC AORTIC ANEURYSM WITH HEMI-ARCH RECONSTRUCTION (30 mm Hemashield platinum graft) 3. HYPOTHERMIC CIRCULATORY ARREST WITH ANTEGRADE CERBRAL PERFUSION  -12/11/2012  4. REMOVAL OF CENTRAL VENOUS CATHETER FROM RIGHT NECK - 12/15/2012   HPI:  The patient is a 72 y.o. male with known history of aortic valve replacement with a mechanical prosthesis performed in 1985 He also has an extensive smoking history and has hypertension. The patient also has a recent history of atrial  fibrillation which was treated with Amiodarone with successful conversion to sinus rhythm. The patient was recently diagnosed with an abdominal aneurysm measuring at 5.5 cm. He underwent stent graft repair performed by Dr. Edilia Bo. CT scan obtained during that time revealed a 5.3 cm ascending aortic aneurysm, prompting referral to [facility] for further evaluation. He was initially evaluated by Dr. Donata Clay on 08/08/2012, at which time it was felt he would benefit from surgical intervention. However, the patient was still recovering from a recent leg fracture, he continued to smoke heavily, and was suffering from bronchitis during the visit. It was also felt that he would require dental evaluation and cardiac catheterization prior to proceeding with surgical intervention, therefore, plans for surgery were delayed.   The patient presented for follow up in October of this year, and had quit smoking. He had completed his physical therapy and rehabilitation from his leg fracture. He also had undergone dental clearance. Echocardiogram was completed and showed the patient's mechanical valve to be functioning properly. Cardiac catheterization did not reveal evidence of coronary disease. It was felt the patient would benefit from [facility] procedure with use of bioprosthetic valve. The risks and benefits of the procedure were explained to the patient and he was agreeable to proceed. Surgery was scheduled for Monday, 12/11/2012. The patient was admitted prior to surgery for bridging of Coumadin for his mechanical aortic valve.      Hospital Course:  The patient was admitted  to [facility] on 12/08/2012.  He was started on Heparin and Coumadin was held in anticipation of surgery.  Preoperative urinalysis was positive for Proteus mirabilis, and he was started on appropriate antibiotic therapy. He otherwise remained stable and without complaints. The patient was taken to the operating room on 12/11/2012 and underwent the above  procedure.    The postoperative course was initially notable for difficulty in removing the patient's Theone Murdoch catheter.  It was felt that the catheter was probably retained by a suture placed during repair if the pulmonary artery, which was done emergently after separation from cardiopulmonary bypass intraoperatively.  He had significant thrombocytopenia and severe ARDS, and was not felt to be a candidate for redo cardiac surgery, therefore, he was returned to the operating room on 12/15/2012, and the catheter was secured in a subcutaneous pocket and stabilized for future removal.  He tolerated the procedure well and was returned to the ICU for further monitoring.    He developed atrial fibrillation, but converted to sinus rhythm on Amiodarone.  He was treated with aggressive diuresis, steroids, antibiotics and pulmonary toilet for ARDS and presumed bronchitis.  His thrombocytopenia was managed conservatively with discontinuation of Heparin, and he was started on Angiomax until HIT panel was proven negative. PT was enlisted to help with deconditioning. He showed steady improvement and was able to be transferred to stepdown on 12/21/2012.  He has continued to make progress.  He remains in sinus rhythm and blood pressures have been low normal.  He completed a 10 day course of antibiotics for his UTI and bronchitis.  He has remained afebrile and white blood count is stable.  Incisions are healing well. Platelet count is back within normal range. He did have significant volume overload and a right pleural effusion on chest x-ray and was treated with IV diuresis with improvement.  He has been weaned from supplemental oxygen, and other vital signs are stable.  He is walking well with cardiac rehab and physical therapy.  The patient is presently clinically stable for discharge home on today's date. We plan to wait for 3 months postop for transvascular extraction of the retained PA catheter, and this has been  discussed extensively with the patient.    Recent vital signs:  Filed Vitals:   12/26/12 0801  BP: 109/65  Pulse: 74  Temp:   Resp:     Recent laboratory studies:  CBC: Lab Results  Component Value Date   WBC 10.7* 12/23/2012   HGB 8.7* 12/23/2012   HCT 27.6* 12/23/2012   MCV 96.8 12/23/2012   PLT 210 12/23/2012    BMET:  Recent Labs  12/24/12 0452 12/25/12 0500  NA 138 137  K 4.0 4.3  CL 97 100  CO2 31 30  GLUCOSE 98 93  BUN 25* 23  CREATININE 1.05 0.96  CALCIUM 8.2* 8.5    PT/INR: No results found for this basename: LABPROT, INR,  in the last 72 hours     Discharge Medications:     Medication List    STOP taking these medications       enoxaparin 100 MG/ML injection  Commonly known as:  LOVENOX     losartan 100 MG tablet  Commonly known as:  COZAAR     nicotine 21 mg/24hr patch  Commonly known as:  NICODERM CQ - dosed in mg/24 hours  Replaced by:  nicotine 14 mg/24hr patch      TAKE these medications       acetaminophen  500 MG tablet  Commonly known as:  TYLENOL  Take 500 mg by mouth every 6 (six) hours as needed. For pain     albuterol 108 (90 BASE) MCG/ACT inhaler  Commonly known as:  PROVENTIL HFA;VENTOLIN HFA  Inhale 2 puffs into the lungs every 6 (six) hours as needed for shortness of breath.     amiodarone 200 MG tablet  Commonly known as:  PACERONE  Take 1 tablet (200 mg total) by mouth 2 (two) times daily.     budesonide-formoterol 80-4.5 MCG/ACT inhaler  Commonly known as:  SYMBICORT  Inhale 2 puffs into the lungs 2 (two) times daily.     diflorasone 0.05 % cream  Commonly known as:  PSORCON  Apply 1 application topically daily as needed. For eczema     ferrous fumarate-b12-vitamic C-folic acid capsule  Commonly known as:  TRINSICON / FOLTRIN  Take 1 capsule by mouth 3 (three) times daily after meals.     furosemide 40 MG tablet  Commonly known as:  LASIX  Take 1 tablet (40 mg total) by mouth daily.      HYDROcodone-acetaminophen 5-325 MG per tablet  Commonly known as:  NORCO/VICODIN  Take 1 tablet by mouth every 4 (four) hours as needed for moderate pain.     metoprolol tartrate 12.5 mg Tabs tablet  Commonly known as:  LOPRESSOR  Take 0.5 tablets (12.5 mg total) by mouth daily.     nicotine 14 mg/24hr patch  Commonly known as:  NICODERM CQ - dosed in mg/24 hours  Place 1 patch (14 mg total) onto the skin daily.     potassium chloride SA 20 MEQ tablet  Commonly known as:  K-DUR,KLOR-CON  Take 1 tablet (20 mEq total) by mouth daily.     thiamine 100 MG tablet  Take 1 tablet (100 mg total) by mouth daily.         Discharge Instructions:  The patient is to refrain from driving, heavy lifting or strenuous activity.  May shower daily and clean incisions with soap and water.  May resume regular diet.    Follow Up:      Discharge Orders   Future Appointments Provider Department Dept Phone   01/08/2013 8:15 AM Antonieta Iba, MD [facility] [place] [phone]   01/17/2013 1:30 PM Kerin Perna, MD [facility] [place] [phone]   03/01/2013 9:30 AM Antonieta Iba, MD [facility] [place] [phone]   08/08/2013 8:30 AM [facility] [place] [facility] [address] [phone]   Eat a light meal the night before the exam Nothing to eat or drink for at least 8 hours before exam No gum chewing, or smoking the morning of the exam. Please take your morning medications with small sips of water, especially blood pressure medication *Very Important* Please wear 2 piece clothing   08/08/2013 9:00 AM Chuck Hint, MD [facility] [phone]   Future Orders Complete By Expires   Amb Referral to Cardiac Rehabilitation  As directed    Comments:     CRP II in [place]      Follow-up Information   Follow up with Julien Nordmann, MD On 01/08/2013. (Appointment is at 8:15 am)    Specialty:   Cardiology   Contact information:   [facility] [place] [address] [place] [address] [phone]       Follow up with VAN Dinah Beers, MD. (PA/LAT CXR to be taken (at [facility]  which is in the same building as Dr. Zenaida Niece Trigt's office) on 01/17/2013 at 12:30 pm;Appointment with Dr. Donata Clay is on 01/17/2013 at 1:30 pm)    Specialty:  Cardiothoracic Surgery   Contact information:   [address] [place] [address] [phone]       Follow up with [facility] [place] NURSE On 01/01/2013. (Office will schedule an appointment with Dr. Zenaida Niece Trigt's nurse for wound check and staple removal)        Trudy Kory H 12/26/2012, 8:19 AM

## 2012-12-26 MED ORDER — FE FUMARATE-B12-VIT C-FA-IFC PO CAPS: 1.0000 | ORAL_CAPSULE | Freq: Three times a day (TID) | ORAL | Status: DC

## 2012-12-26 MED ORDER — METOPROLOL TARTRATE 12.5 MG HALF TABLET: 12.5000 mg | ORAL_TABLET | Freq: Every day | ORAL | Status: DC

## 2012-12-26 MED ORDER — THIAMINE HCL 100 MG PO TABS: 100.0000 mg | ORAL_TABLET | Freq: Every day | ORAL | Status: DC

## 2012-12-26 MED ORDER — HYDROCODONE-ACETAMINOPHEN 5-325 MG PO TABS: 1.0000 | ORAL_TABLET | ORAL | Status: DC | PRN

## 2012-12-26 MED ORDER — AMIODARONE HCL 200 MG PO TABS: 200.0000 mg | ORAL_TABLET | Freq: Two times a day (BID) | ORAL | Status: DC

## 2012-12-26 MED ORDER — FUROSEMIDE 40 MG PO TABS: 40.0000 mg | ORAL_TABLET | Freq: Every day | ORAL | Status: DC

## 2012-12-26 MED ORDER — NICOTINE 14 MG/24HR TD PT24: 14.0000 mg | MEDICATED_PATCH | Freq: Every day | TRANSDERMAL | Status: DC

## 2012-12-26 MED ORDER — POTASSIUM CHLORIDE CRYS ER 20 MEQ PO TBCR: 20.0000 meq | EXTENDED_RELEASE_TABLET | Freq: Every day | ORAL | Status: DC

## 2012-12-26 NOTE — Progress Notes (Signed)
EPW d/c at this time per MD order; pt remained SR overnight; SR this AM; VSS; bedrest until 0845; pt tolerated well; will cont. To monitor.

## 2012-12-26 NOTE — Progress Notes (Signed)
[id number] Education completed with pt and wife. Reviewed information as it had been few days since ed done. Gave written ed re exercise. Referring to [facility] Phase 2. Encouraged low sodium diet. Luetta Nutting RN BSN 12/26/2012 11:20 AM

## 2012-12-26 NOTE — Progress Notes (Signed)
Chest tube sutures d/c at this time per MD order; steri strips applied to site; no drainage noted; will cont. To monitor.

## 2012-12-26 NOTE — Progress Notes (Signed)
Pt and wife given d/c instructions; both verbalized understanding; PICC d/c by IV team; pt to be d/c home with wife and HH; will cont. To monitor.

## 2012-12-26 NOTE — Progress Notes (Addendum)
       [address]       [place] [address]             [phone]          11 Days Post-Op Procedure(s) (LRB): REMOVAL OF CENTRAL LINE ADULT- FROM NECK (Right)  Subjective: Feels well, no complaints.  Breathing stable. Ready to go home.   Objective: Vital signs in last 24 hours: Patient Vitals for the past 24 hrs:  BP Temp Temp src Pulse Resp SpO2 Weight  12/26/12 0746 120/64 mmHg - - 72 - - -  12/26/12 0742 128/73 mmHg - - 72 - - -  12/26/12 0502 113/73 mmHg 97.9 F (36.6 C) Oral 82 18 92 % 194 lb 0.1 oz (88 kg)  12/25/12 2122 - - - - - 94 % -  12/25/12 2033 98/66 mmHg 98 F (36.7 C) Oral 72 17 94 % -  12/25/12 1422 101/66 mmHg 98.2 F (36.8 C) Oral 73 16 91 % -  12/25/12 1045 111/80 mmHg - - - - 95 % -   Current Weight  12/26/12 194 lb 0.1 oz (88 kg)     Intake/Output from previous day: 12/08 0701 - 12/09 0700 In: 720 [P.O.:720] Out: 1200 [Urine:1200]    PHYSICAL EXAM:  Heart: RRR Lungs: Slightly decreased BS in R base Wound: Clean and dry Extremities: Mild LE edema    Lab Results: CBC:No results found for this basename: WBC, HGB, HCT, PLT,  in the last 72 hours BMET:  Recent Labs  12/24/12 0452 12/25/12 0500  NA 138 137  K 4.0 4.3  CL 97 100  CO2 31 30  GLUCOSE 98 93  BUN 25* 23  CREATININE 1.05 0.96  CALCIUM 8.2* 8.5    PT/INR: No results found for this basename: LABPROT, INR,  in the last 72 hours    Assessment/Plan: S/P Procedure(s) (LRB): REMOVAL OF CENTRAL LINE ADULT- FROM NECK (Right) Off O2 and sats stable. Plan home today- instructions reviewed with patient.    LOS: 18 days    COLLINS,GINA H 12/26/2012  patient examined and medical record reviewed,agree with above note. VAN TRIGT III,PETER 12/26/2012

## 2012-12-27 ENCOUNTER — Other Ambulatory Visit: Payer: Self-pay | Admitting: *Deleted

## 2012-12-27 ENCOUNTER — Telehealth: Payer: Self-pay | Admitting: *Deleted

## 2012-12-27 DIAGNOSIS — D649 Anemia, unspecified: Secondary | ICD-10-CM

## 2012-12-27 DIAGNOSIS — I350 Nonrheumatic aortic (valve) stenosis: Secondary | ICD-10-CM

## 2012-12-27 MED ORDER — FOLIC ACID 1 MG PO TABS: 1.0000 mg | ORAL_TABLET | Freq: Every day | ORAL | Status: DC

## 2012-12-27 MED ORDER — FERROUS SULFATE DRIED ER 160 (50 FE) MG PO TBCR: 160.0000 mg | EXTENDED_RELEASE_TABLET | Freq: Two times a day (BID) | ORAL | Status: DC

## 2012-12-27 NOTE — Telephone Encounter (Signed)
I consulted with Coral Ceo, P.A. in regards to the patient being unable to obtain Trinsicon at his local pharmacy. It was decided to order Iron and Folic Acid.

## 2013-01-01 ENCOUNTER — Ambulatory Visit (INDEPENDENT_AMBULATORY_CARE_PROVIDER_SITE_OTHER): Payer: Medicare PPO

## 2013-01-01 DIAGNOSIS — D381 Neoplasm of uncertain behavior of trachea, bronchus and lung: Secondary | ICD-10-CM

## 2013-01-01 DIAGNOSIS — Z4802 Encounter for removal of sutures: Secondary | ICD-10-CM

## 2013-01-01 NOTE — Progress Notes (Signed)
Removed 2 sutures from Central line incision. Removed 1 suture from sternal incision and 7 staples from Axillary Cannulation incision. No signs of infection and pt tolerated well. He is scheduled to see Dr Donata Clay on 01/17/13.

## 2013-01-08 ENCOUNTER — Encounter: Payer: Medicare PPO | Admitting: Cardiovascular Disease

## 2013-01-09 IMAGING — CR DG ABD PORTABLE 1V
2 series · 2 of 2 positions shown · non-contrast
Comparison: Intraoperative repair of AAA - earlier same day

CLINICAL DATA: Post AAA stent placement

PORTABLE ABDOMEN - 1 VIEW

[view not recorded (1 of 2)]
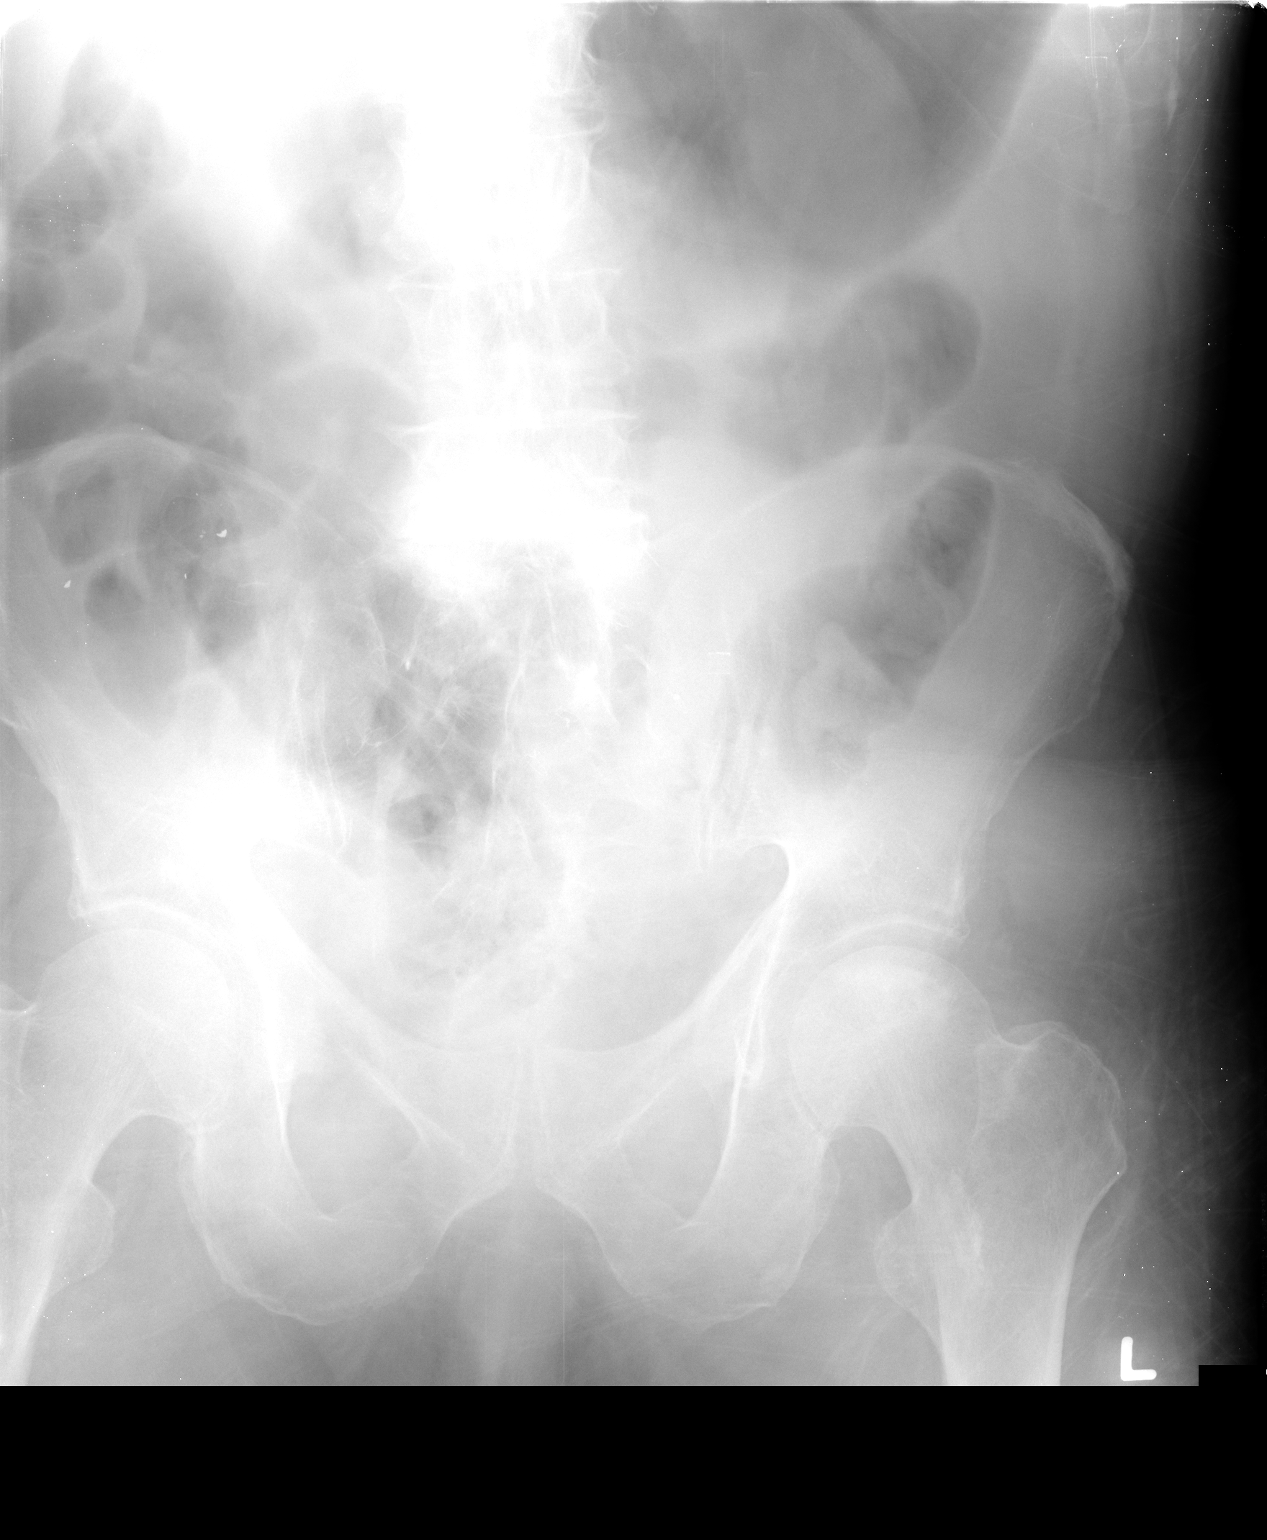

[view not recorded (2 of 2)]
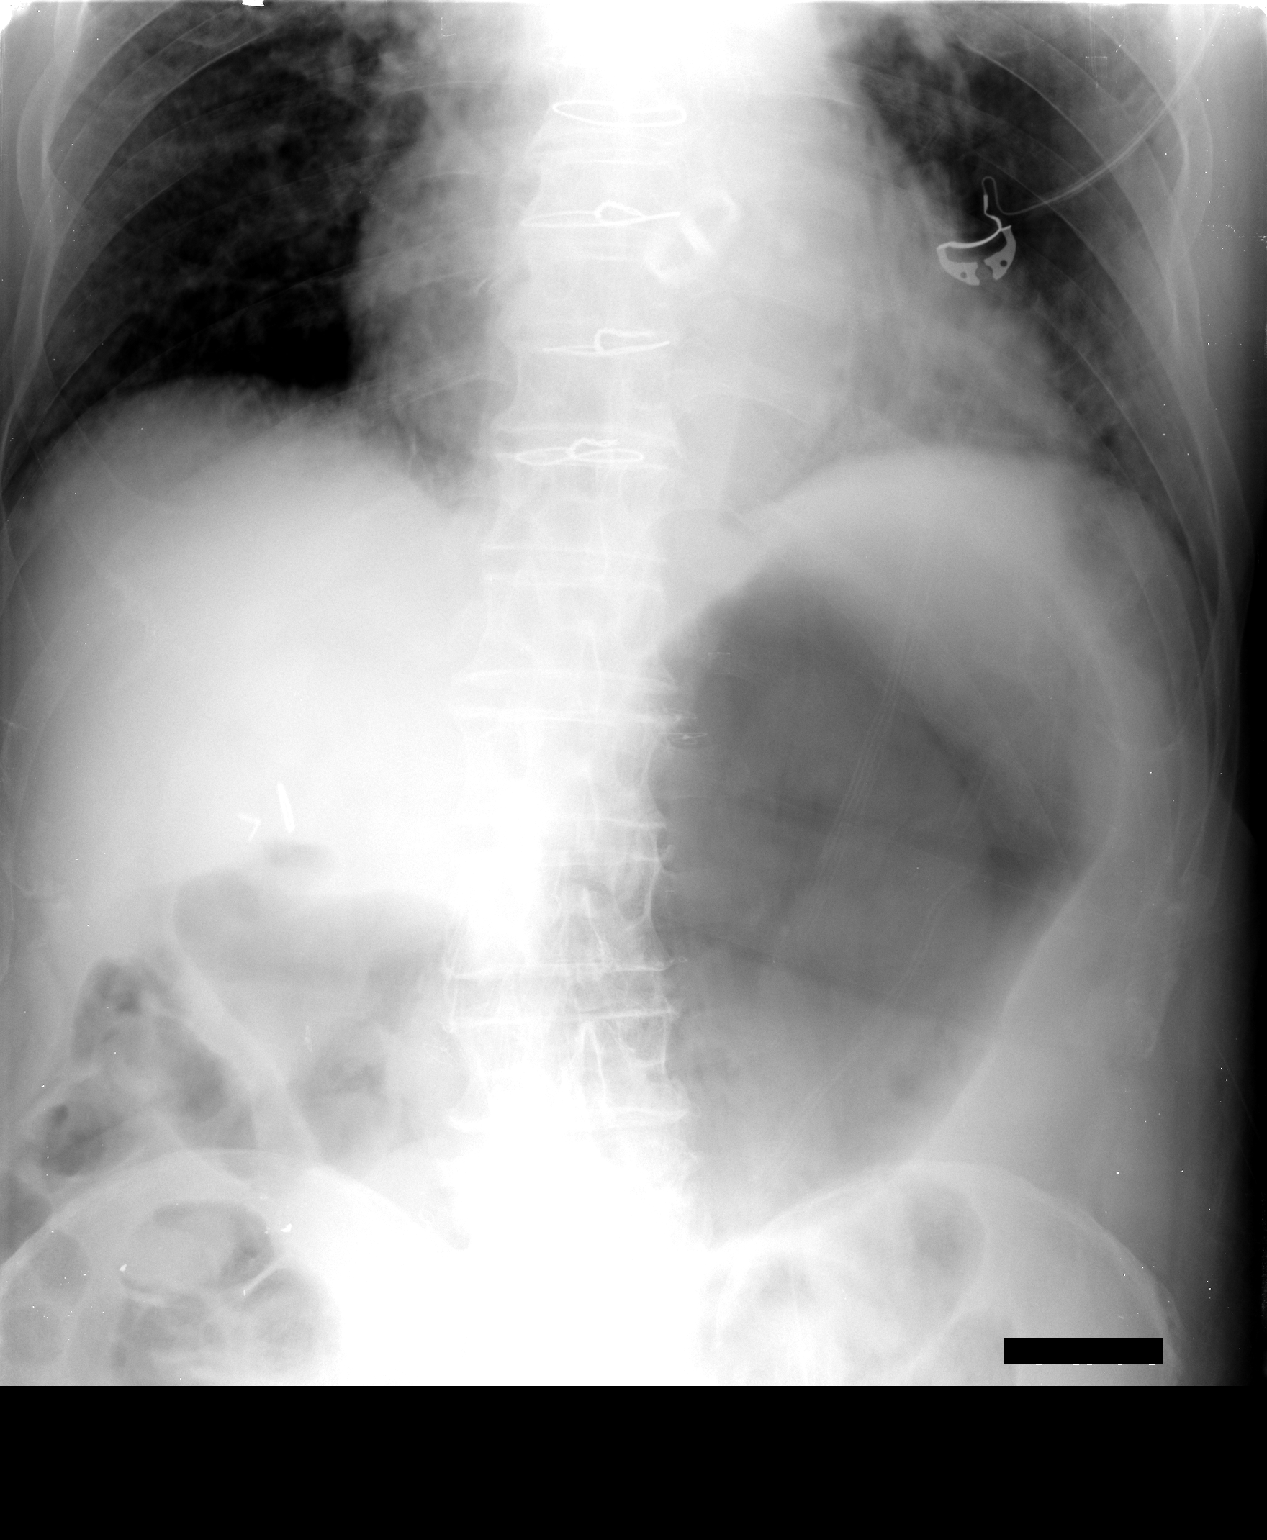

[2 of 2 positions shown; findings below may reference images not displayed]

FINDINGS: Known OLCA repair of AAA is not well depicted on this examination
secondary to underpenetration and bowel gas.

Moderate gaseous distension of the stomach and colon.
Nonobstructive bowel gas pattern.  Evaluation for pneumoperitoneum
is limited secondary to supine patient positioning.
Postcholecystectomy.

Limited visualization of the lower thorax demonstrates enlarged
cardiac silhouette and sequela of prior aortic valve repair.

Multilevel lumbar spine degenerative change.
IMPRESSION: Suboptimal evaluation of known OLCA repair of AAA secondary to
underpenetration and bowel gas.

## 2013-01-09 IMAGING — RF DG ABDOMEN 2V
1 series · 7 of 7 positions shown · non-contrast
Comparison: None.

CLINICAL DATA: LIVIANA repair of AAA

ABDOMEN - 2 VIEW

[Series 1: run · 7 of 7 slices shown]
[im 1/7]
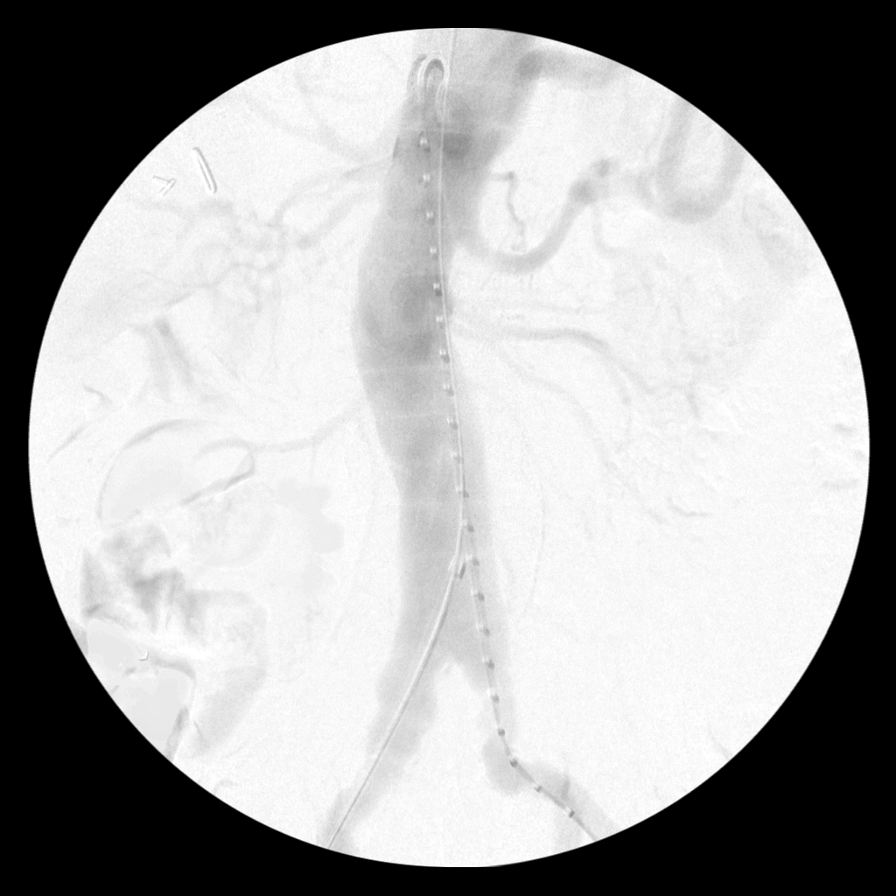
[im 2/7]
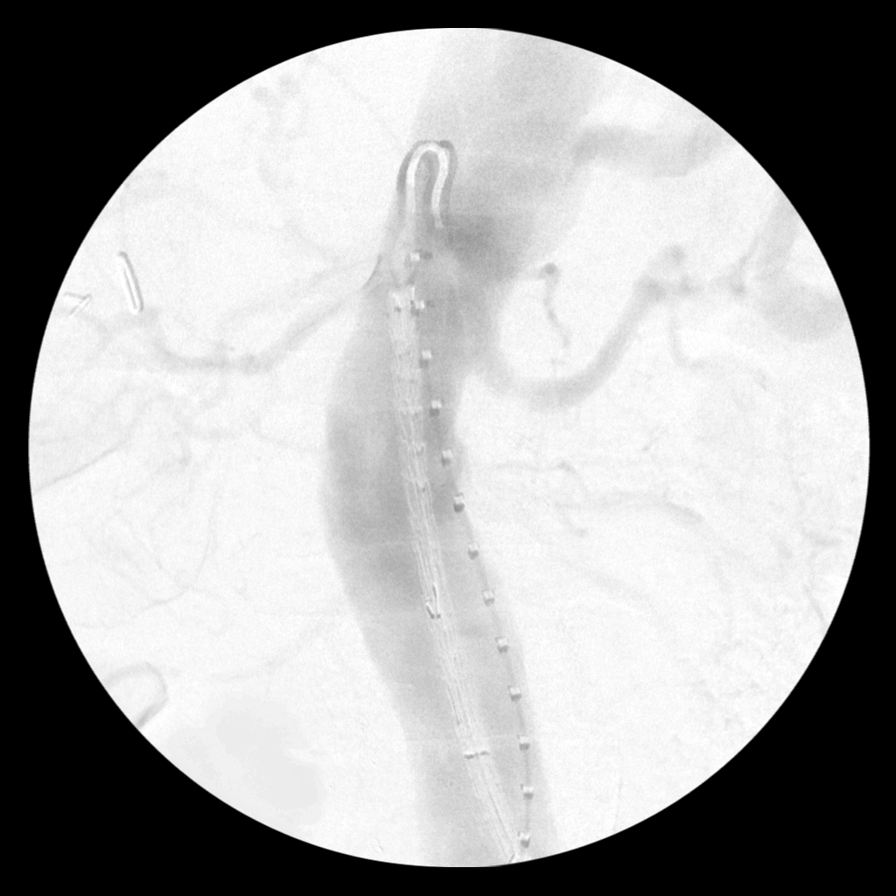
[im 3/7]
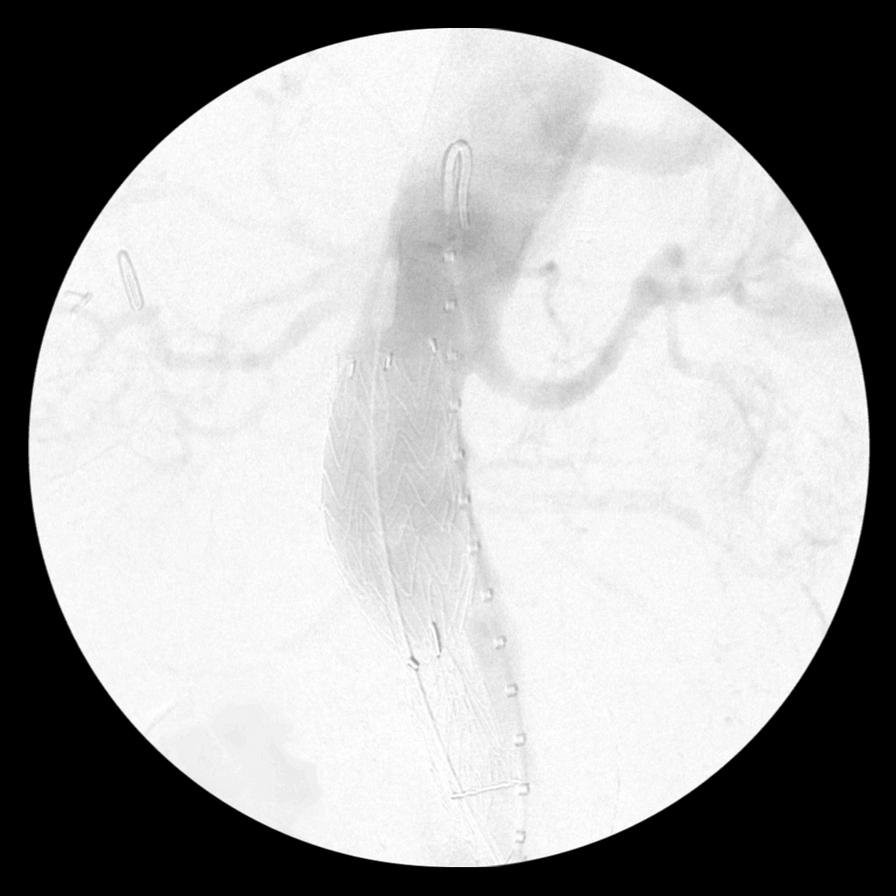
[im 4/7]
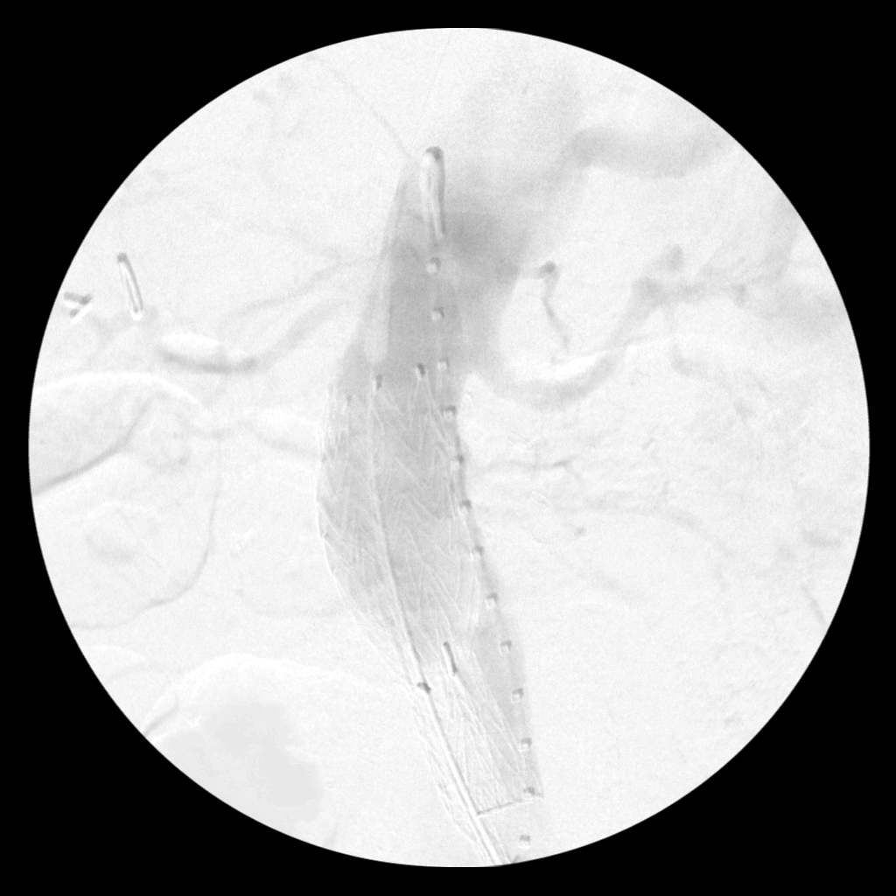
[im 5/7]
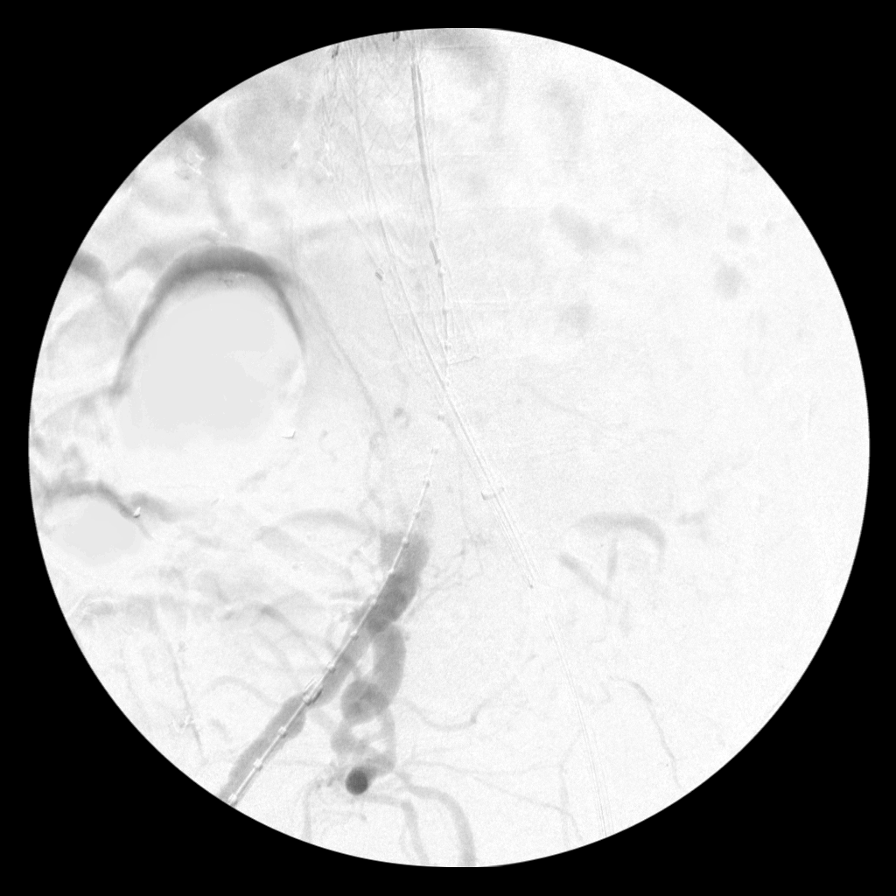
[im 6/7]
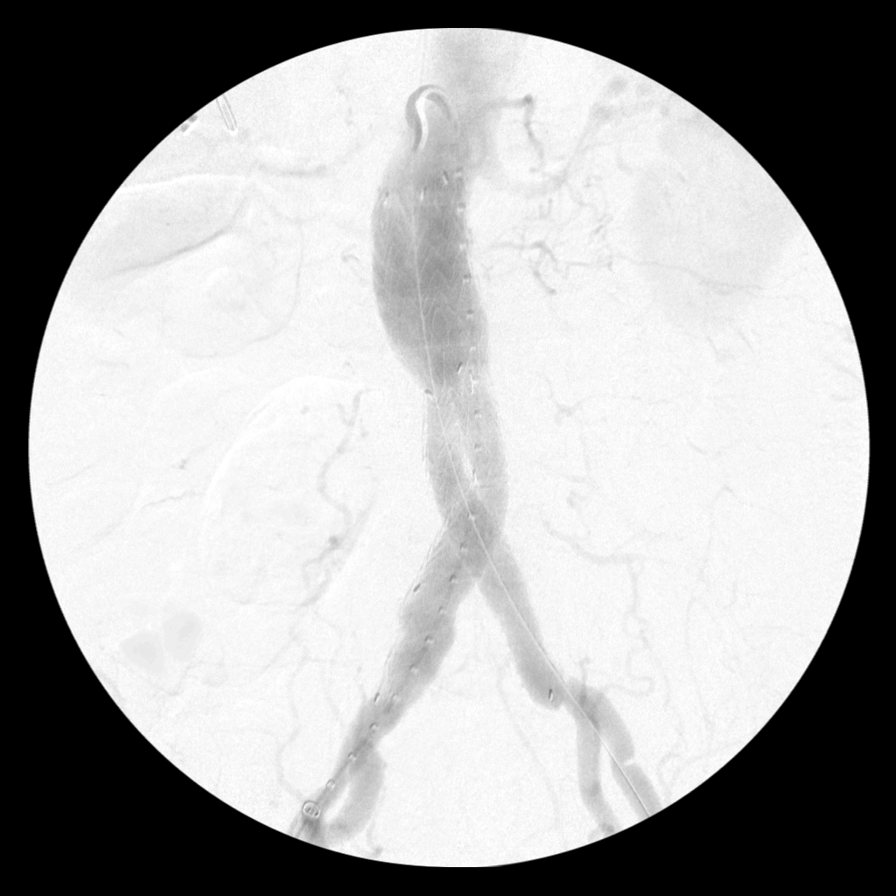
[im 7/7]
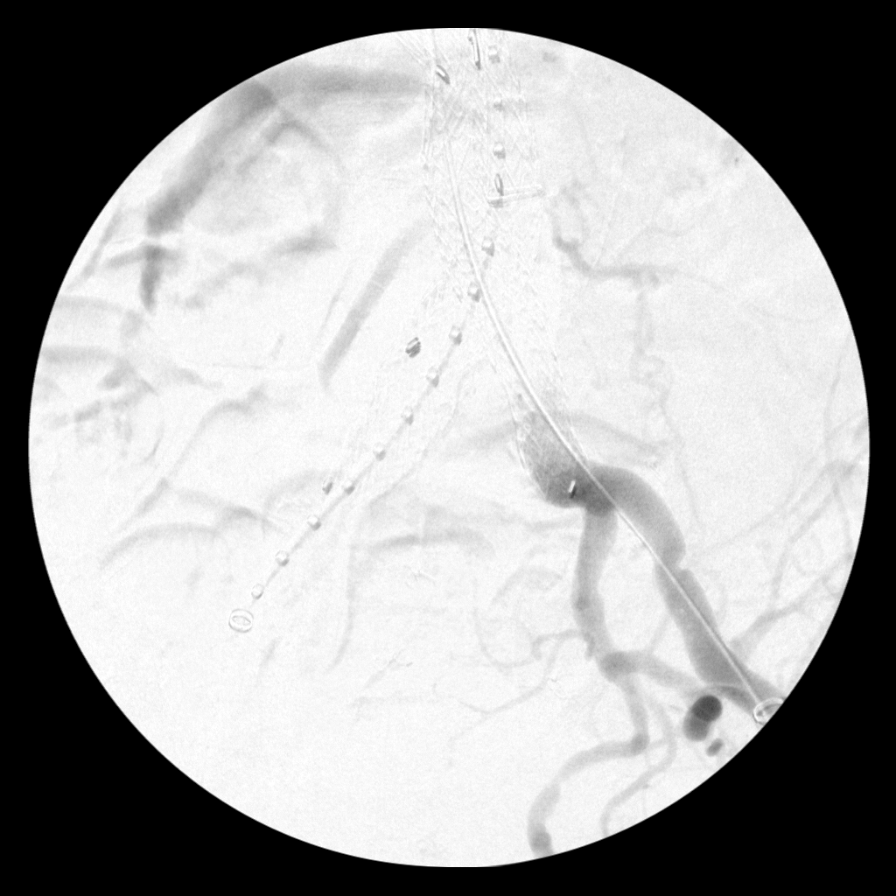

[7 of 7 positions shown; findings below may reference images not displayed]

FINDINGS: Seven spot angiographic intraoperative images of the abdomen during
placement of abdominal aortic stent graft are provided for review.

Images 10 and 11 demonstrate placement of the cranial aspect of the
stent graft below the level of the renal arteries.

Completion image (labeled image 13) demonstrates patency of the
stent graft with filling of the bilateral internal and external
iliac arteries.  The distal limbs of the stent graft appear well
seated.
IMPRESSION: Shiruhaan Ibaa repair of AAA.

## 2013-01-09 IMAGING — CR DG CHEST 1V PORT
1 series · 1 of 1 positions shown · non-contrast
Comparison: Portable exam 8051 hours compared to 06/17/2011

CLINICAL DATA: Post abdominal aortic stent placement

PORTABLE CHEST - 1 VIEW

[view not recorded]
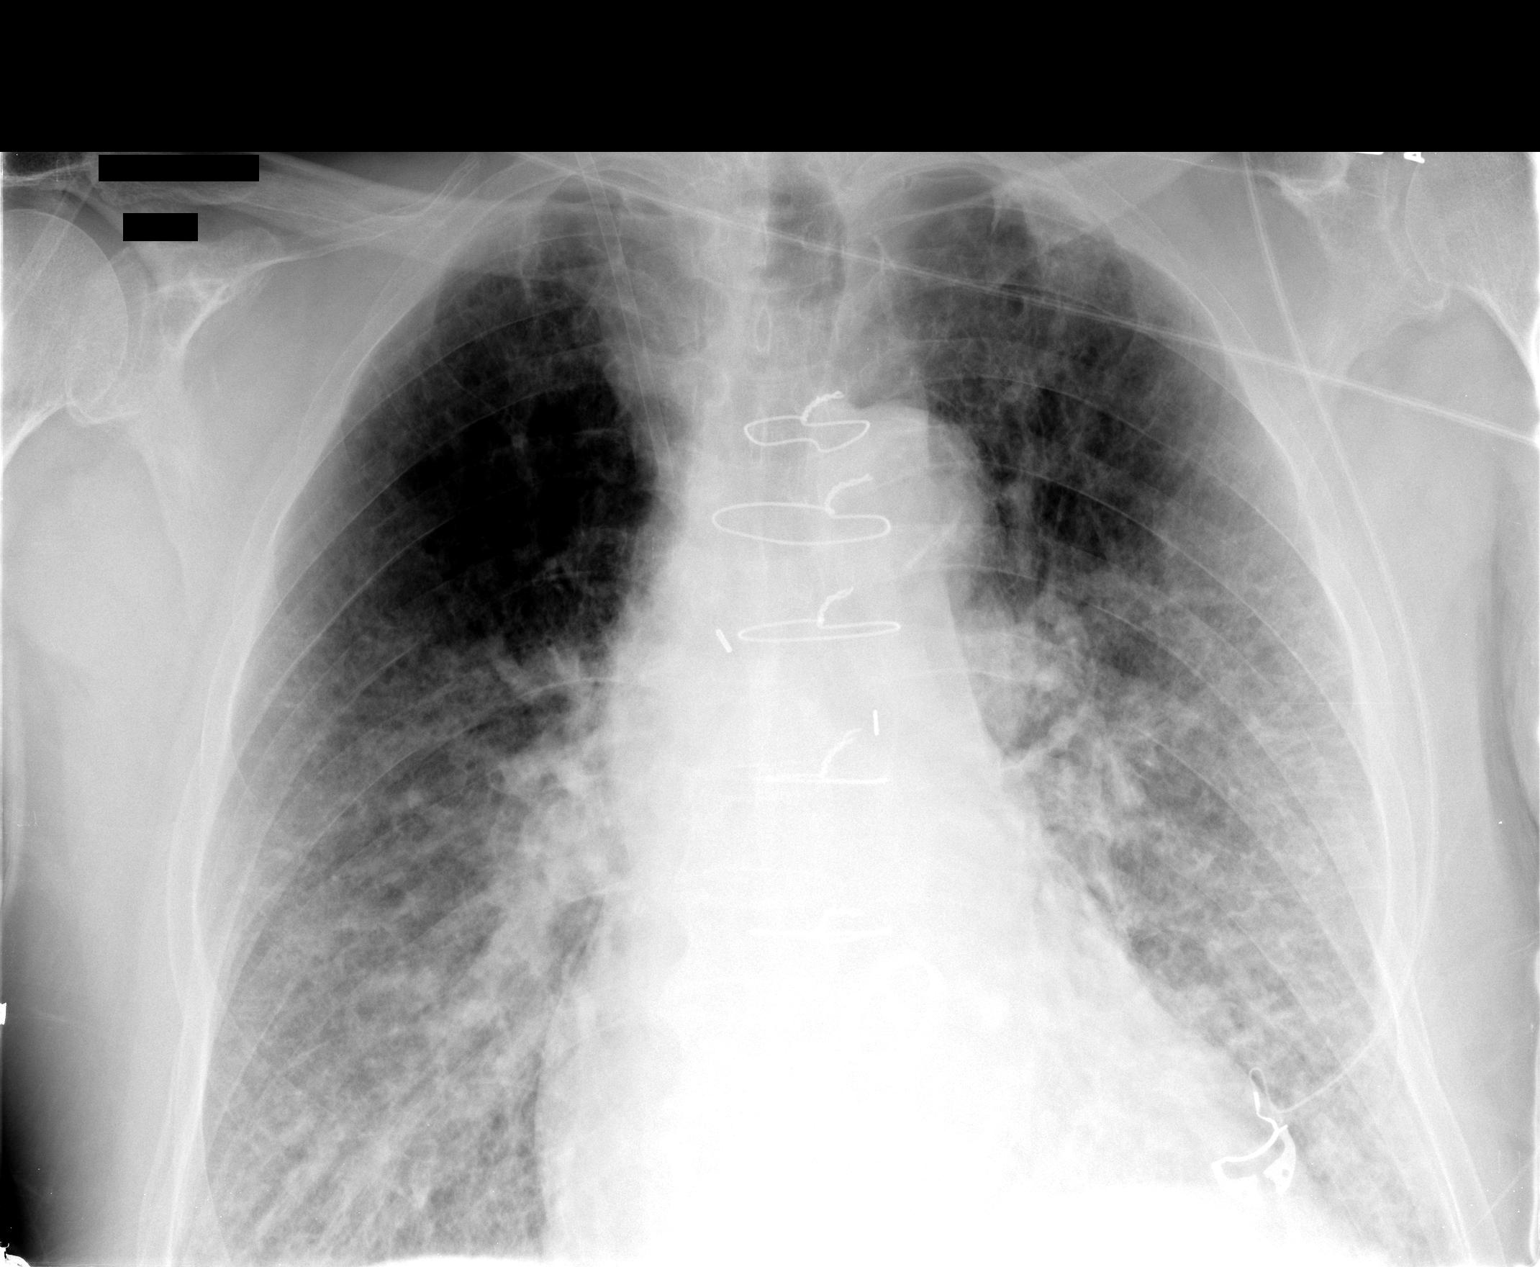

[1 of 1 positions shown; findings below may reference images not displayed]

FINDINGS: Right jugular central venous catheter tip projecting over
confluence of SVC.
Enlargement of cardiac silhouette and slight pulmonary vascular
congestion post MVR.
Atherosclerotic calcification of mildly tortuous thoracic aorta.
Costophrenic angles excluded.
Changes of COPD with diffuse superimposed interstitial infiltrates
likely pulmonary edema.
Mild biapical scarring.
No definite pneumothorax.
IMPRESSION: Enlargement of cardiac silhouette post MVR.
COPD with pulmonary vascular congestion and diffuse interstitial
infiltrates likely representing superimposed pulmonary edema.

## 2013-01-09 NOTE — Telephone Encounter (Signed)
This encounter was created in error - please disregard.

## 2013-01-16 ENCOUNTER — Other Ambulatory Visit: Payer: Self-pay | Admitting: *Deleted

## 2013-01-16 DIAGNOSIS — I350 Nonrheumatic aortic (valve) stenosis: Secondary | ICD-10-CM

## 2013-01-17 ENCOUNTER — Encounter: Payer: Self-pay | Admitting: Cardiothoracic Surgery

## 2013-01-17 ENCOUNTER — Ambulatory Visit
Admission: RE | Admit: 2013-01-17 | Discharge: 2013-01-17 | Disposition: A | Discharge status: Home / Self Care | Payer: Medicare PPO | Source: Ambulatory Visit | Attending: Cardiothoracic Surgery | Admitting: Cardiothoracic Surgery

## 2013-01-17 ENCOUNTER — Ambulatory Visit (INDEPENDENT_AMBULATORY_CARE_PROVIDER_SITE_OTHER): Payer: Medicare PPO | Admitting: Cardiothoracic Surgery

## 2013-01-17 VITALS — BP 114/67 | HR 70 | Resp 16 | Ht 70.0 in | Wt 173.0 lb

## 2013-01-17 DIAGNOSIS — I719 Aortic aneurysm of unspecified site, without rupture: Secondary | ICD-10-CM

## 2013-01-17 DIAGNOSIS — I712 Thoracic aortic aneurysm, without rupture, unspecified: Secondary | ICD-10-CM

## 2013-01-17 DIAGNOSIS — I7121 Aneurysm of the ascending aorta, without rupture: Secondary | ICD-10-CM

## 2013-01-17 DIAGNOSIS — Z09 Encounter for follow-up examination after completed treatment for conditions other than malignant neoplasm: Secondary | ICD-10-CM

## 2013-01-17 DIAGNOSIS — I350 Nonrheumatic aortic (valve) stenosis: Secondary | ICD-10-CM
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
## 2013-01-18 HISTORY — PX: TISSUE AORTIC VALVE REPLACEMENT: SHX2527

## 2013-02-13 ENCOUNTER — Other Ambulatory Visit: Payer: Self-pay | Admitting: *Deleted

## 2013-02-13 DIAGNOSIS — I2510 Atherosclerotic heart disease of native coronary artery without angina pectoris: Secondary | ICD-10-CM

## 2013-02-14 ENCOUNTER — Encounter: Payer: Self-pay | Admitting: Cardiothoracic Surgery

## 2013-02-14 ENCOUNTER — Ambulatory Visit
Admission: RE | Admit: 2013-02-14 | Discharge: 2013-02-14 | Disposition: A | Discharge status: Home / Self Care | Payer: Medicare PPO | Source: Ambulatory Visit | Attending: Cardiothoracic Surgery | Admitting: Cardiothoracic Surgery

## 2013-02-14 ENCOUNTER — Ambulatory Visit (INDEPENDENT_AMBULATORY_CARE_PROVIDER_SITE_OTHER): Payer: Medicare PPO | Admitting: Cardiothoracic Surgery

## 2013-02-14 VITALS — BP 127/70 | HR 62 | Resp 16 | Ht 70.0 in | Wt 173.0 lb

## 2013-02-14 DIAGNOSIS — Z09 Encounter for follow-up examination after completed treatment for conditions other than malignant neoplasm: Secondary | ICD-10-CM

## 2013-02-14 DIAGNOSIS — I719 Aortic aneurysm of unspecified site, without rupture: Secondary | ICD-10-CM

## 2013-02-14 DIAGNOSIS — Z952 Presence of prosthetic heart valve: Secondary | ICD-10-CM

## 2013-02-14 DIAGNOSIS — I712 Thoracic aortic aneurysm, without rupture, unspecified: Secondary | ICD-10-CM

## 2013-02-14 DIAGNOSIS — I7121 Aneurysm of the ascending aorta, without rupture: Secondary | ICD-10-CM

## 2013-02-14 DIAGNOSIS — I2510 Atherosclerotic heart disease of native coronary artery without angina pectoris: Secondary | ICD-10-CM

## 2013-02-14 DIAGNOSIS — Z954 Presence of other heart-valve replacement: Secondary | ICD-10-CM

## 2013-02-14 IMAGING — CT CT ABD-PELV W/ CM
3 of 5 series · 11 of 36 positions shown, 17 images · IV contrast (READICAT/WATER & [ID] OMNI 300)
Comparison: None.

CLINICAL DATA: Postop AAA stent placement

CT ABDOMEN AND PELVIS WITH CONTRAST
TECHNIQUE: Multidetector CT imaging of the abdomen and pelvis was
performed following the standard protocol during bolus
administration of intravenous contrast.
Contrast: 125mL OMNIPAQUE IOHEXOL 300 MG/ML  SOLN

[Series 3: abd/pelvis with · axial · 0.77mm/px · z∈[-366,-66]mm · 7 of 81 slices shown, 12 images]
[im 11/81  soft-tissue]
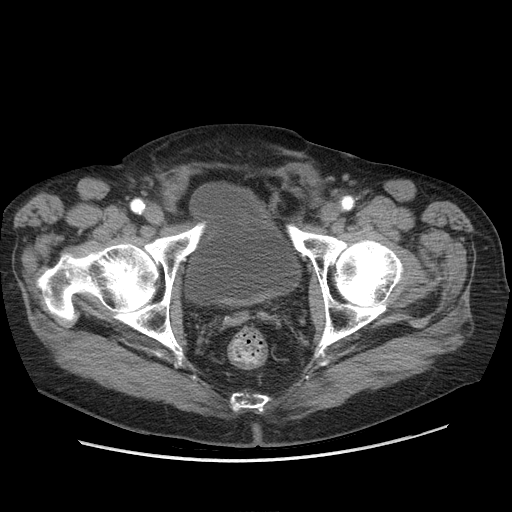
[im 11/81  bone]
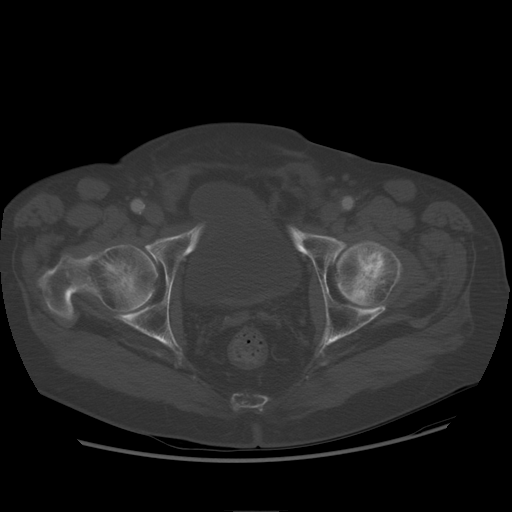
[im 21/81  soft-tissue]
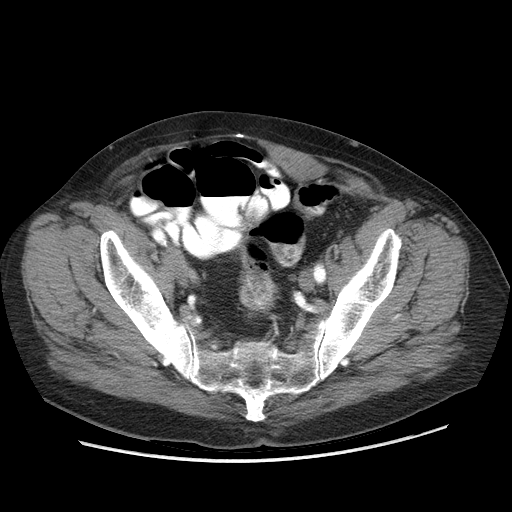
[im 31/81  soft-tissue]
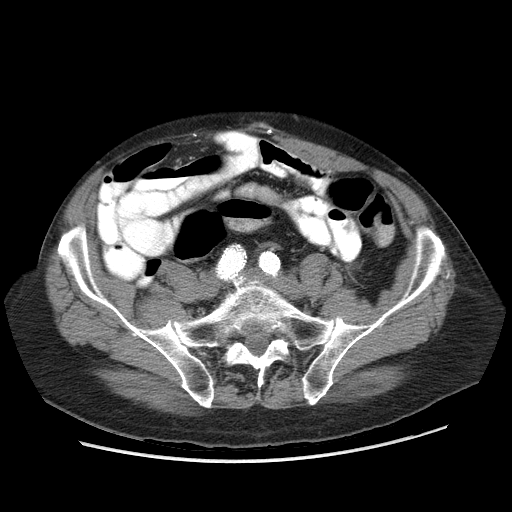
[im 41/81  soft-tissue]
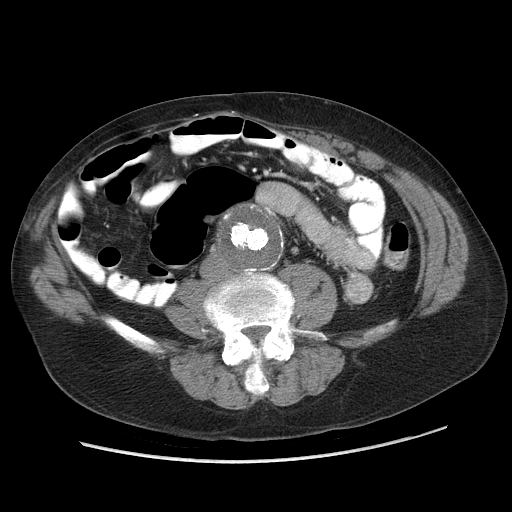
[im 41/81  lung]
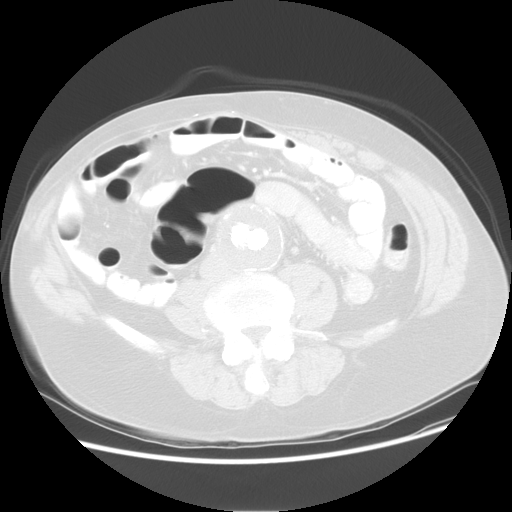
[im 51/81  soft-tissue]
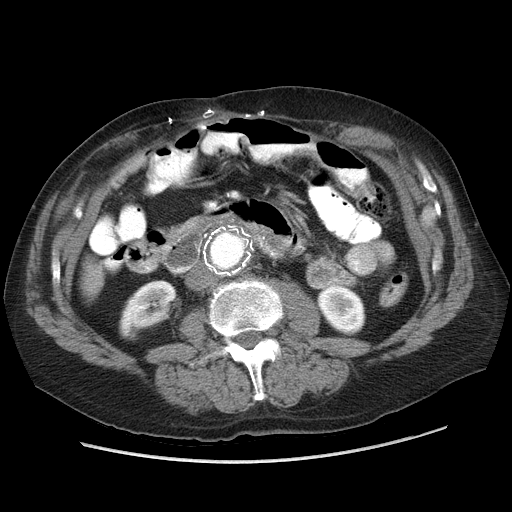
[im 51/81  lung]
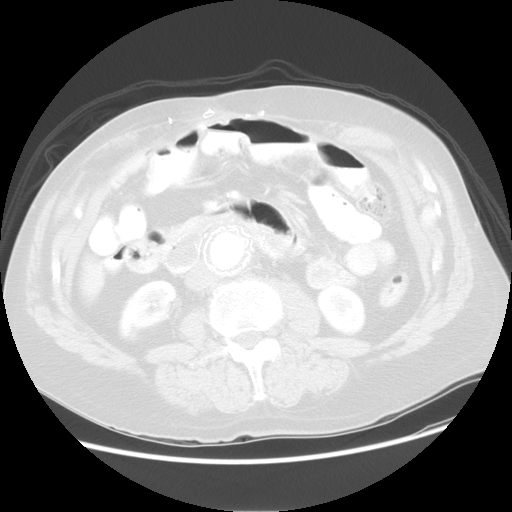
[im 61/81  soft-tissue]
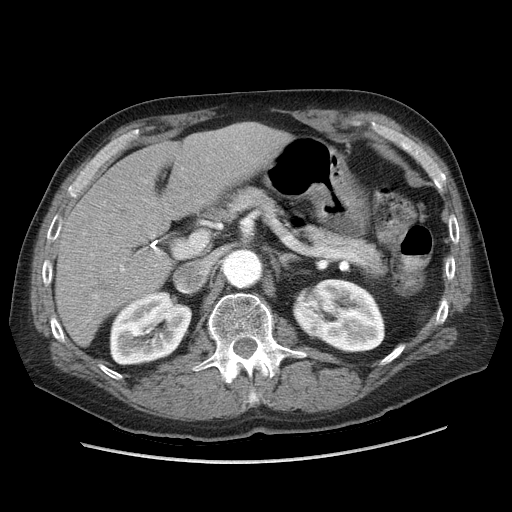
[im 61/81  lung]
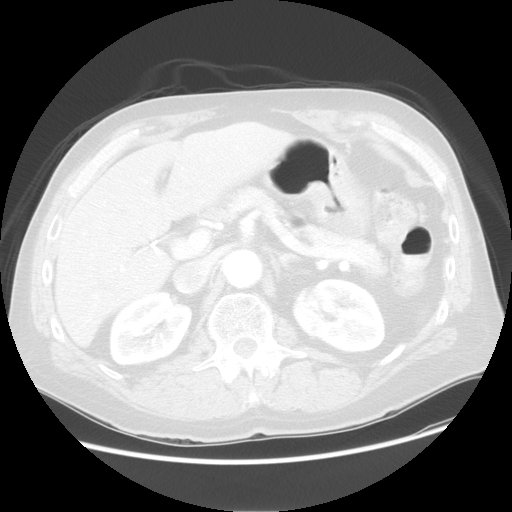
[im 71/81  soft-tissue]
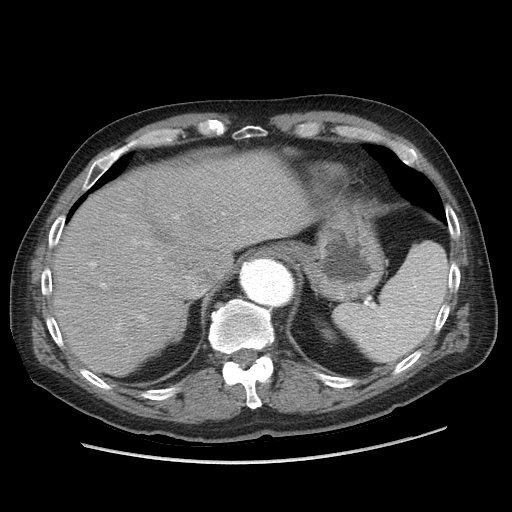
[im 71/81  lung]
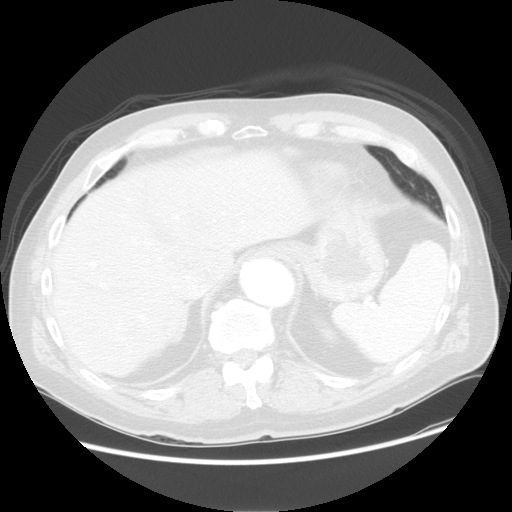

[Series 5: renal delay · axial · delayed · 0.77mm/px · z∈[-278,-148]mm · 3 of 54 slices shown]
[im 14/54  soft-tissue]
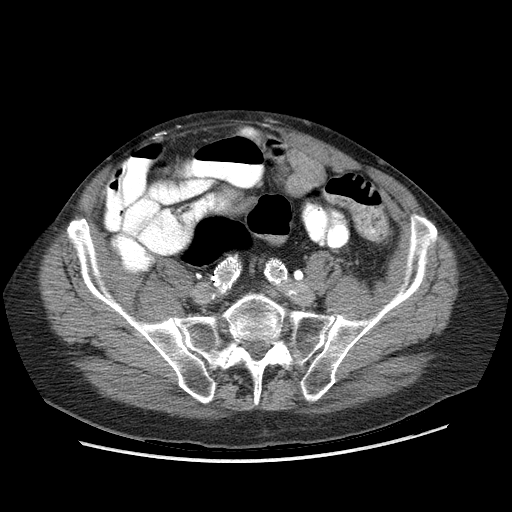
[im 27/54  soft-tissue]
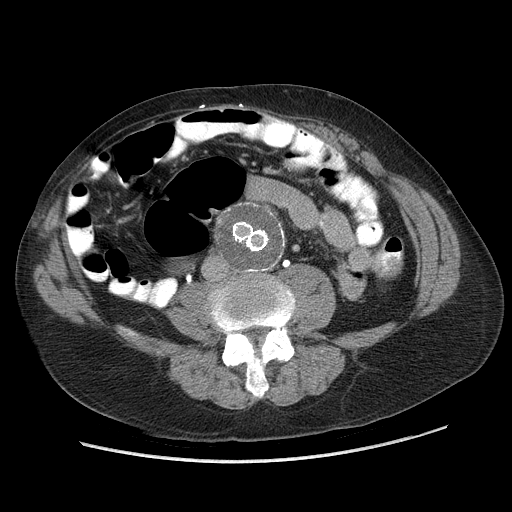
[im 40/54  soft-tissue]
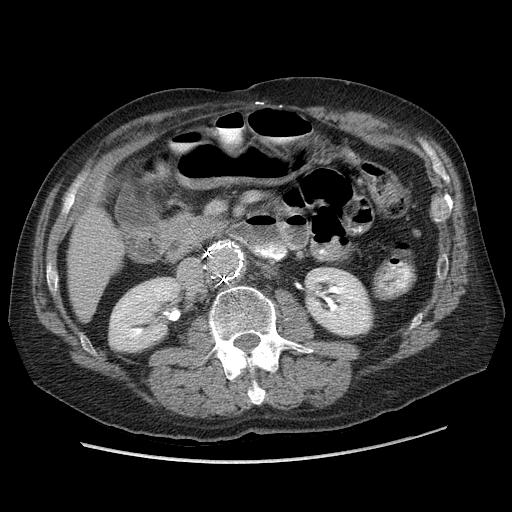

[Series 601: coronal body · coronal · 0.91mm/px · 1 of 114 slices shown, 2 images]
[im 38/114  soft-tissue]
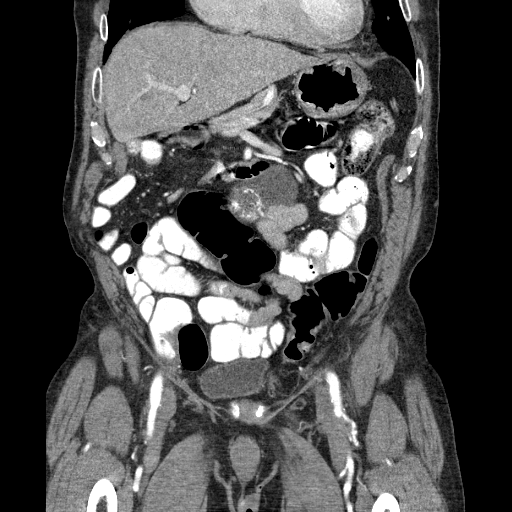
[im 38/114  bone]
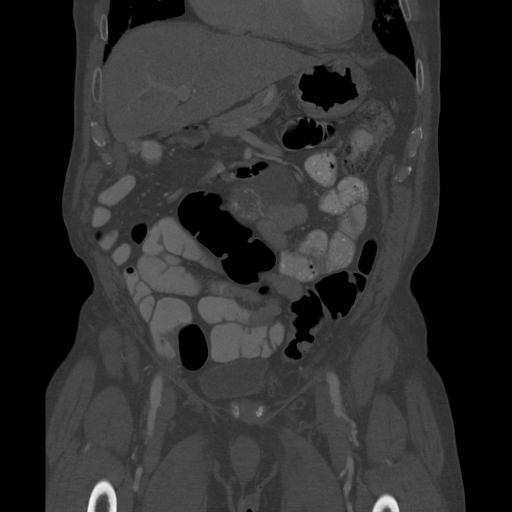

[11 of 36 positions shown; findings below may reference images not displayed]

FINDINGS: The lung bases are clear.  There is moderate cardiomegaly
present.  A AAA stent graft is present.  The graft extends
proximally to a point just below the origins of the renal arteries
and is slightly angulated at that point. The lumen of the stent
graft opacifies with no complicating features.  On delayed images,
there is no evidence of Endo leak.  The native aneurysm sac
measures 5.4 x 5.4 cm. However, periaortic along the proximal and
mid stent to the left of midline there is an oval fluid collection
present of 6.3 x 3.1 x 3.5 cm with an attenuation of 4 HU.  Most
likely this represents a seroma or lymphocele and is of
questionable significance, not filling with contrast material.  An
adjacent duodenal or proximal diverticulum is present.  No prior
imaging studies available for comparison.

The liver enhances with no focal abnormality and no ductal
dilatation is seen.  Surgical clips are present from prior
cholecystectomy.  The pancreas is normal in size and the pancreatic
duct is not dilated.  There does appear to be a descending duodenal
diverticulum present.  The right adrenal gland is normal size with
slight prominence of the left adrenal gland possibly due to
incidental adenoma.  The spleen is normal in size.  The stomach is
moderately fluid distended with no abnormality noted.  The kidneys
enhance and there is a nonobstructing right mid upper renal
calculus present.  There does appear to be some parenchymal loss in
the lower pole of the right kidney.  No adenopathy is seen.

The iliac limbs of the AAA stent opacify with no complicating
features.  There is good opacification of both the internal and
external iliac arteries bilaterally.  The urinary bladder is
unremarkable. There is an small left groin hernia containing only
fat.  The prostate is normal in size.  A few colonic diverticula
present.  The patient appears to have undergone prior right
hemicolectomy possibly due to prior trauma.  No acute bony
abnormality is seen.
IMPRESSION: 1.  AAA stent graft in good position with no complicating features.
No evidence of endovascular leak.
2. The native aneurysm sac measures 5.4 x 5.4 cm.  No prior imaging
study is available for comparison.

3.  Low attenuation oval collection adjacent to the proximal
portion of the stent most likely represents seroma or lymphocele.
Compare with prior imaging study.
4.  Single nonobstructing right mid upper renal calculus.
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
## 2013-03-01 ENCOUNTER — Encounter: Payer: Self-pay | Admitting: Cardiovascular Disease

## 2013-03-01 ENCOUNTER — Ambulatory Visit (INDEPENDENT_AMBULATORY_CARE_PROVIDER_SITE_OTHER): Payer: Medicare PPO | Admitting: Cardiovascular Disease

## 2013-03-01 VITALS — BP 128/78 | HR 57 | Ht 69.0 in | Wt 185.2 lb

## 2013-03-01 DIAGNOSIS — I714 Abdominal aortic aneurysm, without rupture, unspecified: Secondary | ICD-10-CM

## 2013-03-01 DIAGNOSIS — Z952 Presence of prosthetic heart valve: Secondary | ICD-10-CM

## 2013-03-01 DIAGNOSIS — Z954 Presence of other heart-valve replacement: Secondary | ICD-10-CM

## 2013-03-01 DIAGNOSIS — I712 Thoracic aortic aneurysm, without rupture, unspecified: Secondary | ICD-10-CM

## 2013-03-01 DIAGNOSIS — I7121 Aneurysm of the ascending aorta, without rupture: Secondary | ICD-10-CM

## 2013-03-01 DIAGNOSIS — I4891 Unspecified atrial fibrillation: Secondary | ICD-10-CM

## 2013-03-01 DIAGNOSIS — E785 Hyperlipidemia, unspecified: Secondary | ICD-10-CM

## 2013-03-01 DIAGNOSIS — F17200 Nicotine dependence, unspecified, uncomplicated: Secondary | ICD-10-CM

## 2013-03-01 DIAGNOSIS — R0602 Shortness of breath: Secondary | ICD-10-CM

## 2013-03-01 NOTE — Patient Instructions (Signed)
You are doing well. No medication changes were made.  Please call us if you have new issues that need to be addressed before your next appt.  Your physician wants you to follow-up in: 6 months.  You will receive a reminder letter in the mail two months in advance. If you don't receive a letter, please call our office to schedule the follow-up appointment.   

## 2013-03-01 NOTE — Progress Notes (Signed)
Patient ID: Gregory Turner, male    DOB: 11-05-1940, 73 y.o.   MRN: [id number]  HPI Comments: [name] is a very pleasant 73 year old gentleman with a history of aortic valve replacement in May 1985 previously followed at [facility], long history of smoking who continues to smoke one pack per day, hypertension,  noted to be in atrial fibrillation in early March 2013 when evaluated by Dr. Benita Turner. Amiodarone was started on his previous clinic visit for pharmacologic cardioversion. He did not have any significant symptoms and was taking warfarin for his prosthetic mechanical valve.  infrarenal abdominal aortic aneurysm was found estimated at 5.9 cm, aortic endograft stenting done in [facility] by Dr. Scot Turner in 2013   He is status post ascending aorta aneurysm repair with removal of his prior mechanical aortic valve, placement of tissue valve by Dr. Prescott Turner in [place] end of November 2014 In general he reports that he recovered well. He does report complication with catheter (Swan-Ganz catheter?) That may be still indwelling that will require removal at a later date. He denies any significant leg edema, coughing, otherwise has good energy and is walking on a daily basis.  Some mild chronic shortness of breath. He is looking forward to playing golf next month Wife continues to smoke  Prior cardiac Catheterization 11/21/2012 showing right dominant coronary system with mild diffuse atherosclerosis, 30% LAD disease, normal right heart pressures  Previous echocardiogram showed normal LV function, mildly dilated left atrium, normal functioning aortic valve mechanical valve. He has a strong family history of aneurysm, with a brother who had AAA rupture, mother with AAA. Most of the family smokes.  EKG today shows normal sinus rhythm with rate 57 beats per minute, no significant ST or T wave changes Laboratory January 2014 shows total cholesterol 160, LDL 91, HDL 37, creatinine 1.29 He  stopped taking Lipitor on his own in the past   Outpatient Encounter Prescriptions as of 03/01/2013  Medication Sig  . acetaminophen (TYLENOL) 500 MG tablet Take 500 mg by mouth every 6 (six) hours as needed. For pain  . amiodarone (PACERONE) 200 MG tablet Take 1 tablet (200 mg total) by mouth 2 (two) times daily.  Marland Kitchen aspirin 81 MG tablet Take 81 mg by mouth daily.  . metoprolol tartrate (LOPRESSOR) 12.5 mg TABS tablet Take 0.5 tablets (12.5 mg total) by mouth daily.  . nicotine (NICODERM CQ - DOSED IN MG/24 HOURS) 14 mg/24hr patch Place 1 patch (14 mg total) onto the skin daily.     Review of Systems  Constitutional: Negative.   HENT: Negative.   Eyes: Negative.   Respiratory: Negative.   Cardiovascular: Negative.   Gastrointestinal: Negative.   Endocrine: Negative.   Musculoskeletal: Negative.   Skin: Negative.   Allergic/Immunologic: Negative.   Neurological: Negative.   Hematological: Negative.   Psychiatric/Behavioral: Negative.   All other systems reviewed and are negative.    BP 128/78  Pulse 57  Ht 5\' 9"  (1.753 m)  Wt 185 lb 4 oz (84.029 kg)  BMI 27.34 kg/m2  Physical Exam  Nursing note and vitals reviewed. Constitutional: He is oriented to person, place, and time. He appears well-developed and well-nourished.  HENT:  Head: Normocephalic.  Nose: Nose normal.  Mouth/Throat: Oropharynx is clear and moist.  Eyes: Conjunctivae are normal. Pupils are equal, round, and reactive to light.  Neck: Normal range of motion. Neck supple. No JVD present.  Cardiovascular: Normal rate, regular rhythm, S1 normal, S2 normal and intact distal  pulses.  Exam reveals no gallop and no friction rub.   Murmur heard.  Crescendo systolic murmur is present with a grade of 2/6  Audible click from his prosthetic valve  Pulmonary/Chest: Effort normal and breath sounds normal. No respiratory distress. He has no wheezes. He has no rales. He exhibits no tenderness.  Abdominal: Soft. Bowel  sounds are normal. He exhibits no distension. There is no tenderness.  Musculoskeletal: Normal range of motion. He exhibits no edema and no tenderness.  Lymphadenopathy:    He has no cervical adenopathy.  Neurological: He is alert and oriented to person, place, and time. Coordination normal.  Skin: Skin is warm and dry. No rash noted. No erythema.  Psychiatric: He has a normal mood and affect. His behavior is normal. Judgment and thought content normal.      Assessment and Plan

## 2013-03-01 NOTE — Assessment & Plan Note (Signed)
Off his warfarin now with a tissue valve, taking aspirin

## 2013-03-01 NOTE — Assessment & Plan Note (Signed)
Doing well following repair in November 2014. Restrictions until early March 2015. No changes to his medications. Overall doing well, recovering nicely, doing his own PT

## 2013-03-01 NOTE — Assessment & Plan Note (Signed)
He previously took himself off his statin. We'll discuss with him on his next visit

## 2013-03-01 NOTE — Assessment & Plan Note (Signed)
We have encouraged him to continue to work on weaning his cigarettes and smoking cessation. He will continue to work on this and does not want any assistance with chantix.  

## 2013-03-09 ENCOUNTER — Other Ambulatory Visit: Payer: Self-pay | Admitting: *Deleted

## 2013-03-09 DIAGNOSIS — I712 Thoracic aortic aneurysm, without rupture, unspecified: Secondary | ICD-10-CM

## 2013-03-14 ENCOUNTER — Encounter: Payer: Self-pay | Admitting: Cardiothoracic Surgery

## 2013-03-14 ENCOUNTER — Ambulatory Visit (INDEPENDENT_AMBULATORY_CARE_PROVIDER_SITE_OTHER): Payer: Medicare PPO | Admitting: Cardiothoracic Surgery

## 2013-03-14 ENCOUNTER — Ambulatory Visit
Admission: RE | Admit: 2013-03-14 | Discharge: 2013-03-14 | Disposition: A | Discharge status: Home / Self Care | Payer: Medicare PPO | Source: Ambulatory Visit | Attending: Cardiothoracic Surgery | Admitting: Cardiothoracic Surgery

## 2013-03-14 VITALS — BP 145/89 | HR 56 | Resp 20 | Ht 69.0 in | Wt 185.0 lb

## 2013-03-14 DIAGNOSIS — Z954 Presence of other heart-valve replacement: Secondary | ICD-10-CM

## 2013-03-14 DIAGNOSIS — I2510 Atherosclerotic heart disease of native coronary artery without angina pectoris: Secondary | ICD-10-CM

## 2013-03-14 DIAGNOSIS — I712 Thoracic aortic aneurysm, without rupture, unspecified: Secondary | ICD-10-CM

## 2013-03-14 DIAGNOSIS — Z952 Presence of prosthetic heart valve: Secondary | ICD-10-CM

## 2013-03-14 DIAGNOSIS — Z09 Encounter for follow-up examination after completed treatment for conditions other than malignant neoplasm: Secondary | ICD-10-CM

## 2013-03-14 DIAGNOSIS — I7121 Aneurysm of the ascending aorta, without rupture: Secondary | ICD-10-CM

## 2013-03-14 DIAGNOSIS — I719 Aortic aneurysm of unspecified site, without rupture: Secondary | ICD-10-CM
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
## 2013-03-15 ENCOUNTER — Other Ambulatory Visit: Payer: Self-pay | Admitting: *Deleted

## 2013-03-15 DIAGNOSIS — J90 Pleural effusion, not elsewhere classified: Secondary | ICD-10-CM

## 2013-03-28 ENCOUNTER — Ambulatory Visit (HOSPITAL_COMMUNITY)
Admission: RE | Admit: 2013-03-28 | Discharge: 2013-03-28 | Disposition: A | Discharge status: Home / Self Care | Payer: Medicare PPO | Source: Ambulatory Visit | Attending: Cardiothoracic Surgery | Admitting: Cardiothoracic Surgery

## 2013-03-28 ENCOUNTER — Encounter (HOSPITAL_COMMUNITY): Payer: Self-pay

## 2013-03-28 ENCOUNTER — Other Ambulatory Visit (HOSPITAL_COMMUNITY): Payer: Medicare PPO

## 2013-03-28 DIAGNOSIS — I4891 Unspecified atrial fibrillation: Secondary | ICD-10-CM

## 2013-03-28 DIAGNOSIS — R0789 Other chest pain: Secondary | ICD-10-CM

## 2013-03-28 DIAGNOSIS — J90 Pleural effusion, not elsewhere classified: Secondary | ICD-10-CM | POA: Insufficient documentation

## 2013-03-28 DIAGNOSIS — J4489 Other specified chronic obstructive pulmonary disease: Secondary | ICD-10-CM | POA: Insufficient documentation

## 2013-03-28 DIAGNOSIS — J449 Chronic obstructive pulmonary disease, unspecified: Secondary | ICD-10-CM

## 2013-03-28 DIAGNOSIS — I369 Nonrheumatic tricuspid valve disorder, unspecified: Secondary | ICD-10-CM

## 2013-03-28 DIAGNOSIS — R079 Chest pain, unspecified: Secondary | ICD-10-CM | POA: Insufficient documentation

## 2013-04-04 ENCOUNTER — Encounter: Payer: Self-pay | Admitting: Cardiothoracic Surgery

## 2013-04-04 ENCOUNTER — Ambulatory Visit (INDEPENDENT_AMBULATORY_CARE_PROVIDER_SITE_OTHER): Payer: Medicare PPO | Admitting: Cardiothoracic Surgery

## 2013-04-04 VITALS — HR 50 | Resp 20 | Ht 69.0 in | Wt 185.0 lb

## 2013-04-04 DIAGNOSIS — Z954 Presence of other heart-valve replacement: Secondary | ICD-10-CM

## 2013-04-04 DIAGNOSIS — Z09 Encounter for follow-up examination after completed treatment for conditions other than malignant neoplasm: Secondary | ICD-10-CM

## 2013-04-04 DIAGNOSIS — I712 Thoracic aortic aneurysm, without rupture, unspecified: Secondary | ICD-10-CM

## 2013-04-04 DIAGNOSIS — Z952 Presence of prosthetic heart valve: Secondary | ICD-10-CM

## 2013-04-04 DIAGNOSIS — I7121 Aneurysm of the ascending aorta, without rupture: Secondary | ICD-10-CM
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
## 2013-04-17 ENCOUNTER — Other Ambulatory Visit: Payer: Self-pay | Admitting: *Deleted

## 2013-04-17 DIAGNOSIS — S25409A Unspecified injury of unspecified pulmonary blood vessels, initial encounter: Secondary | ICD-10-CM

## 2013-04-17 LAB — PULMONARY FUNCTION TEST
DL/VA % pred: 61 %
DL/VA: 2.8 ml/min/mmHg/L
DLCO cor % pred: 61 %
DLCO cor: 18.95 ml/min/mmHg
DLCO unc % pred: 60 %
DLCO unc: 18.68 ml/min/mmHg
FEF 25-75 Post: 1.2 L/sec
FEF 25-75 Pre: 1.17 L/sec
FEF2575-%Change-Post: 2 %
FEF2575-%Pred-Post: 52 %
FEF2575-%Pred-Pre: 50 %
FEV1-%Change-Post: -1 %
FEV1-%Pred-Post: 76 %
FEV1-%Pred-Pre: 76 %
FEV1-Post: 2.33 L
FEV1-Pre: 2.35 L
FEV1FVC-%Change-Post: -5 %
FEV1FVC-%Pred-Pre: 86 %
FEV6-%Change-Post: 3 %
FEV6-%Pred-Post: 96 %
FEV6-%Pred-Pre: 93 %
FEV6-Post: 3.82 L
FEV6-Pre: 3.69 L
FEV6FVC-%Change-Post: 0 %
FEV6FVC-%Pred-Post: 104 %
FEV6FVC-%Pred-Pre: 105 %
FVC-%Change-Post: 4 %
FVC-%Pred-Post: 92 %
FVC-%Pred-Pre: 88 %
FVC-Post: 3.88 L
Post FEV1/FVC ratio: 60 %
Post FEV6/FVC ratio: 98 %
Pre FEV1/FVC ratio: 63 %
Pre FEV6/FVC Ratio: 99 %
RV % pred: 130 %
RV: 3.16 L
TLC % pred: 107 %
TLC: 7.35 L

## 2013-04-30 ENCOUNTER — Encounter (HOSPITAL_COMMUNITY): Payer: Self-pay

## 2013-05-03 ENCOUNTER — Other Ambulatory Visit (HOSPITAL_COMMUNITY): Payer: Medicare PPO

## 2013-05-11 ENCOUNTER — Encounter (HOSPITAL_COMMUNITY): Payer: Self-pay

## 2013-05-11 ENCOUNTER — Encounter (HOSPITAL_COMMUNITY)
Admission: RE | Admit: 2013-05-11 | Discharge: 2013-05-11 | Disposition: A | Discharge status: Home / Self Care | Payer: Medicare PPO | Source: Ambulatory Visit | Attending: Cardiothoracic Surgery | Admitting: Cardiothoracic Surgery

## 2013-05-11 VITALS — BP 156/84 | HR 55 | Temp 98.2°F | Resp 18 | Ht 70.0 in | Wt 188.2 lb

## 2013-05-11 DIAGNOSIS — Z01818 Encounter for other preprocedural examination: Secondary | ICD-10-CM | POA: Insufficient documentation

## 2013-05-11 DIAGNOSIS — Z01810 Encounter for preprocedural cardiovascular examination: Secondary | ICD-10-CM | POA: Insufficient documentation

## 2013-05-11 DIAGNOSIS — Z01812 Encounter for preprocedural laboratory examination: Secondary | ICD-10-CM | POA: Insufficient documentation

## 2013-05-11 DIAGNOSIS — S25409A Unspecified injury of unspecified pulmonary blood vessels, initial encounter: Secondary | ICD-10-CM

## 2013-05-11 HISTORY — DX: Atherosclerotic heart disease of native coronary artery without angina pectoris: I25.10

## 2013-05-11 LAB — BLOOD GAS, ARTERIAL
Acid-base deficit: 0.1 mmol/L (ref 0.0–2.0)
Bicarbonate: 23.8 mEq/L (ref 20.0–24.0)
Drawn by: 344381
FIO2: 0.21 %
O2 Saturation: 95.6 %
Patient temperature: 98.6
TCO2: 25 mmol/L (ref 0–100)
pCO2 arterial: 37.6 mmHg (ref 35.0–45.0)
pH, Arterial: 7.419 (ref 7.350–7.450)
pO2, Arterial: 74.1 mmHg — ABNORMAL LOW (ref 80.0–100.0)

## 2013-05-11 LAB — APTT: aPTT: 34 seconds (ref 24–37)

## 2013-05-11 LAB — CBC
HCT: 41.7 % (ref 39.0–52.0)
Hemoglobin: 13.5 g/dL (ref 13.0–17.0)
MCH: 29.2 pg (ref 26.0–34.0)
MCHC: 32.4 g/dL (ref 30.0–36.0)
MCV: 90.1 fL (ref 78.0–100.0)
Platelets: 175 10*3/uL (ref 150–400)
RBC: 4.63 MIL/uL (ref 4.22–5.81)
RDW: 15.1 % (ref 11.5–15.5)
WBC: 7.6 10*3/uL (ref 4.0–10.5)

## 2013-05-11 LAB — COMPREHENSIVE METABOLIC PANEL
ALT: 12 U/L (ref 0–53)
AST: 16 U/L (ref 0–37)
Albumin: 3.3 g/dL — ABNORMAL LOW (ref 3.5–5.2)
Alkaline Phosphatase: 87 U/L (ref 39–117)
BUN: 17 mg/dL (ref 6–23)
CO2: 19 mEq/L (ref 19–32)
Calcium: 8.9 mg/dL (ref 8.4–10.5)
Chloride: 103 mEq/L (ref 96–112)
Creatinine, Ser: 0.88 mg/dL (ref 0.50–1.35)
GFR calc Af Amer: >90 mL/min (ref >90)
GFR calc non Af Amer: 84 mL/min — ABNORMAL LOW (ref >90)
Glucose, Bld: 99 mg/dL (ref 70–99)
Potassium: 4.3 mEq/L (ref 3.7–5.3)
Sodium: 138 mEq/L (ref 137–147)
Total Bilirubin: 0.2 mg/dL — ABNORMAL LOW (ref 0.3–1.2)
Total Protein: 7.1 g/dL (ref 6.0–8.3)

## 2013-05-11 LAB — HEMOGLOBIN A1C
Hgb A1c MFr Bld: 6.2 % — ABNORMAL HIGH (ref <5.7)
Mean Plasma Glucose: 131 mg/dL — ABNORMAL HIGH (ref <117)

## 2013-05-11 LAB — SURGICAL PCR SCREEN
MRSA, PCR: NEGATIVE
Staphylococcus aureus: NEGATIVE

## 2013-05-11 LAB — URINALYSIS, ROUTINE W REFLEX MICROSCOPIC
Bilirubin Urine: NEGATIVE
Glucose, UA: NEGATIVE mg/dL
Hgb urine dipstick: NEGATIVE
Ketones, ur: NEGATIVE mg/dL
Leukocytes, UA: NEGATIVE
Nitrite: NEGATIVE
Protein, ur: NEGATIVE mg/dL
Specific Gravity, Urine: 1.017 (ref 1.005–1.030)
Urobilinogen, UA: 0.2 mg/dL (ref 0.0–1.0)
pH: 6 (ref 5.0–8.0)

## 2013-05-11 LAB — PROTIME-INR
INR: 1.04 (ref 0.00–1.49)
Prothrombin Time: 13.4 seconds (ref 11.6–15.2)

## 2013-05-11 MED ORDER — CHLORHEXIDINE GLUCONATE 4 % EX LIQD: 30.0000 mL | CUTANEOUS | Status: DC

## 2013-05-11 NOTE — Pre-Procedure Instructions (Addendum)
Gregory Turner  05/11/2013   Your procedure is scheduled on:  Tuesday, April 28.  Report to [facility], Main Entrance [facility]" at 5:30 AM.  Call this number if you have problems the morning of surgery: [phone]   Remember:   Do not eat food or drink liquids after midnight Monday, April 27.   Take these medicines the morning of surgery with A SIP OF WATER: amiodarone (PACERONE), metoprolol tartrate (LOPRESSOR).               May use inhalers, bring albuterol (PROVENTIL HFA;VENTOLIN HFA) Inhaler to the hospital with you.              May take Tylenol if needed.               Do  Not take any NSAIDS (Aspirin, Ibuprofen, Naproxen).     Do not wear jewelry.  Do not wear lotions, powders, or perfumes.              Men may shave face and neck.  Do not bring valuables to the hospital.  [facility] is not responsible  for any belongings or valuables.               Contacts, dentures or bridgework may not be worn into surgery.  Leave suitcase in the car. After surgery it may be brought to your room.  For patients admitted to the hospital, discharge time is determined by your   treatment team.                 Special Instructions: [place] - Preparing for Surgery  Before surgery, you can play an important role.  Because skin is not sterile, your skin needs to be as free of germs as possible.  You can reduce the number of germs on you skin by washing with CHG (chlorahexidine gluconate) soap before surgery.  CHG is an antiseptic cleaner which kills germs and bonds with the skin to continue killing germs even after washing.  Please DO NOT use if you have an allergy to CHG or antibacterial soaps.  If your skin becomes reddened/irritated stop using the CHG and inform your nurse when you arrive at Short Stay.  Do not shave (including legs and underarms) for at least 48 hours prior to the first CHG shower.  You may shave your face.  Please follow these instructions  carefully:   1.  Shower with CHG Soap the night before surgery and the                                morning of Surgery.  2.  If you choose to wash your hair, wash your hair first as usual with your       normal shampoo.  3.  After you shampoo, rinse your hair and body thoroughly to remove the                      Shampoo.  4.  Use CHG as you would any other liquid soap.  You can apply chg directly       to the skin and wash gently with scrungie or a clean washcloth.  5.  Apply the CHG Soap to your body ONLY FROM THE NECK DOWN.        Do not use on open wounds or open sores.  Avoid contact with your  eyes,       ears, mouth and genitals (private parts).  Wash genitals (private parts)       with your normal soap.  6.  Wash thoroughly, paying special attention to the area where your surgery        will be performed.  7.  Thoroughly rinse your body with warm water from the neck down.  8.  DO NOT shower/wash with your normal soap after using and rinsing off       the CHG Soap.  9.  Pat yourself dry with a clean towel.            10.  Wear clean pajamas.            11.  Place clean sheets on your bed the night of your first shower and do not        sleep with pets.  Day of Surgery  Do not apply any lotions/deoderants the morning of surgery.  Please wear clean clothes to the hospital/surgery center.      Please read over the following fact sheets that you were given: Pain Booklet, Coughing and Deep Breathing, Blood Transfusion Information and Surgical Site Infection Prevention  and Incentive Spirometry.

## 2013-05-14 ENCOUNTER — Ambulatory Visit (INDEPENDENT_AMBULATORY_CARE_PROVIDER_SITE_OTHER): Payer: Medicare PPO | Admitting: Cardiothoracic Surgery

## 2013-05-14 ENCOUNTER — Other Ambulatory Visit (HOSPITAL_COMMUNITY): Payer: Self-pay | Admitting: Interventional Radiology

## 2013-05-14 ENCOUNTER — Encounter: Payer: Self-pay | Admitting: Cardiothoracic Surgery

## 2013-05-14 ENCOUNTER — Other Ambulatory Visit: Payer: Self-pay | Admitting: Cardiothoracic Surgery

## 2013-05-14 VITALS — BP 131/87 | HR 55 | Resp 16 | Ht 69.0 in | Wt 185.0 lb

## 2013-05-14 DIAGNOSIS — I714 Abdominal aortic aneurysm, without rupture, unspecified: Secondary | ICD-10-CM

## 2013-05-14 DIAGNOSIS — Z09 Encounter for follow-up examination after completed treatment for conditions other than malignant neoplasm: Secondary | ICD-10-CM

## 2013-05-14 DIAGNOSIS — Z954 Presence of other heart-valve replacement: Secondary | ICD-10-CM

## 2013-05-14 DIAGNOSIS — I712 Thoracic aortic aneurysm, without rupture, unspecified: Secondary | ICD-10-CM

## 2013-05-14 DIAGNOSIS — Z952 Presence of prosthetic heart valve: Secondary | ICD-10-CM

## 2013-05-14 DIAGNOSIS — Z189 Retained foreign body fragments, unspecified material: Secondary | ICD-10-CM

## 2013-05-14 MED ORDER — DEXTROSE 5 % IV SOLN
30.0000 ug/min | INTRAVENOUS | Status: DC
  Filled 2013-05-14: qty 2

## 2013-05-14 MED ORDER — VANCOMYCIN HCL 10 G IV SOLR
1500.0000 mg | INTRAVENOUS | Status: AC
  Administered 2013-05-15: 1500 mg via INTRAVENOUS
  Filled 2013-05-14: qty 1500

## 2013-05-14 MED ORDER — SODIUM CHLORIDE 0.9 % IV SOLN
INTRAVENOUS | Status: DC
  Filled 2013-05-14: qty 30

## 2013-05-14 MED ORDER — SODIUM CHLORIDE 0.9 % IV SOLN
INTRAVENOUS | Status: DC
  Filled 2013-05-14: qty 1

## 2013-05-14 MED ORDER — MAGNESIUM SULFATE 50 % IJ SOLN
40.0000 meq | INTRAMUSCULAR | Status: DC
  Filled 2013-05-14: qty 10

## 2013-05-14 MED ORDER — DEXTROSE 5 % IV SOLN
750.0000 mg | INTRAVENOUS | Status: DC
  Filled 2013-05-14: qty 750

## 2013-05-14 MED ORDER — SODIUM CHLORIDE 0.9 % IV SOLN
INTRAVENOUS | Status: DC
  Filled 2013-05-14: qty 40

## 2013-05-14 MED ORDER — NITROGLYCERIN IN D5W 200-5 MCG/ML-% IV SOLN
2.0000 ug/min | INTRAVENOUS | Status: DC
  Filled 2013-05-14 (×2): qty 250

## 2013-05-14 MED ORDER — PLASMA-LYTE 148 IV SOLN
INTRAVENOUS | Status: DC
  Filled 2013-05-14: qty 2.5

## 2013-05-14 MED ORDER — DEXMEDETOMIDINE HCL IN NACL 400 MCG/100ML IV SOLN
0.1000 ug/kg/h | INTRAVENOUS | Status: DC
  Filled 2013-05-14: qty 100

## 2013-05-14 MED ORDER — POTASSIUM CHLORIDE 2 MEQ/ML IV SOLN
80.0000 meq | INTRAVENOUS | Status: DC
  Filled 2013-05-14: qty 40

## 2013-05-14 MED ORDER — DEXTROSE 5 % IV SOLN
1.5000 g | INTRAVENOUS | Status: DC
  Filled 2013-05-14: qty 1.5

## 2013-05-14 MED ORDER — METOPROLOL TARTRATE 12.5 MG HALF TABLET: 12.5000 mg | ORAL_TABLET | Freq: Once | ORAL | Status: DC

## 2013-05-14 MED ORDER — DOPAMINE-DEXTROSE 3.2-5 MG/ML-% IV SOLN
2.0000 ug/kg/min | INTRAVENOUS | Status: DC
  Filled 2013-05-14: qty 250

## 2013-05-14 MED ORDER — EPINEPHRINE HCL 1 MG/ML IJ SOLN
0.5000 ug/min | INTRAVENOUS | Status: DC
  Filled 2013-05-14: qty 4
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
## 2013-05-15 ENCOUNTER — Ambulatory Visit (HOSPITAL_COMMUNITY)
Admission: RE | Admit: 2013-05-15 | Discharge: 2013-05-15 | Disposition: A | Discharge status: Home / Self Care | Payer: Medicare PPO | Source: Ambulatory Visit | Attending: Interventional Radiology | Admitting: Interventional Radiology

## 2013-05-15 ENCOUNTER — Encounter (HOSPITAL_COMMUNITY): Payer: Medicare PPO | Admitting: Certified Registered Nurse Anesthetist

## 2013-05-15 ENCOUNTER — Inpatient Hospital Stay (HOSPITAL_COMMUNITY): Payer: Medicare PPO | Admitting: Certified Registered Nurse Anesthetist

## 2013-05-15 ENCOUNTER — Encounter (HOSPITAL_COMMUNITY)
Admission: RE | Disposition: A | Discharge status: Home / Self Care | Payer: Medicare PPO | Source: Ambulatory Visit | Attending: Cardiothoracic Surgery

## 2013-05-15 ENCOUNTER — Inpatient Hospital Stay (HOSPITAL_COMMUNITY): Payer: Medicare PPO

## 2013-05-15 ENCOUNTER — Observation Stay (HOSPITAL_COMMUNITY)
Admission: RE | Admit: 2013-05-15 | Discharge: 2013-05-16 | Disposition: A | Discharge status: Home / Self Care | Payer: Medicare PPO | Source: Ambulatory Visit | Attending: Cardiothoracic Surgery | Admitting: Cardiothoracic Surgery

## 2013-05-15 ENCOUNTER — Other Ambulatory Visit: Payer: Self-pay

## 2013-05-15 ENCOUNTER — Encounter (HOSPITAL_COMMUNITY): Payer: Self-pay | Admitting: *Deleted

## 2013-05-15 DIAGNOSIS — I714 Abdominal aortic aneurysm, without rupture, unspecified: Secondary | ICD-10-CM | POA: Insufficient documentation

## 2013-05-15 DIAGNOSIS — Z182 Retained plastic fragments: Secondary | ICD-10-CM

## 2013-05-15 DIAGNOSIS — Z954 Presence of other heart-valve replacement: Secondary | ICD-10-CM | POA: Insufficient documentation

## 2013-05-15 DIAGNOSIS — M795 Residual foreign body in soft tissue: Secondary | ICD-10-CM

## 2013-05-15 DIAGNOSIS — T81509A Unspecified complication of foreign body accidentally left in body following unspecified procedure, initial encounter: Principal | ICD-10-CM | POA: Insufficient documentation

## 2013-05-15 DIAGNOSIS — J90 Pleural effusion, not elsewhere classified: Secondary | ICD-10-CM | POA: Insufficient documentation

## 2013-05-15 DIAGNOSIS — I4891 Unspecified atrial fibrillation: Secondary | ICD-10-CM | POA: Insufficient documentation

## 2013-05-15 DIAGNOSIS — S25409A Unspecified injury of unspecified pulmonary blood vessels, initial encounter: Secondary | ICD-10-CM

## 2013-05-15 DIAGNOSIS — J449 Chronic obstructive pulmonary disease, unspecified: Secondary | ICD-10-CM | POA: Insufficient documentation

## 2013-05-15 DIAGNOSIS — E785 Hyperlipidemia, unspecified: Secondary | ICD-10-CM | POA: Insufficient documentation

## 2013-05-15 DIAGNOSIS — I739 Peripheral vascular disease, unspecified: Secondary | ICD-10-CM | POA: Insufficient documentation

## 2013-05-15 DIAGNOSIS — T81500A Unspecified complication of foreign body accidentally left in body following surgical operation, initial encounter: Secondary | ICD-10-CM | POA: Insufficient documentation

## 2013-05-15 DIAGNOSIS — I2510 Atherosclerotic heart disease of native coronary artery without angina pectoris: Secondary | ICD-10-CM | POA: Insufficient documentation

## 2013-05-15 DIAGNOSIS — Y9210 Unspecified residential institution as the place of occurrence of the external cause: Secondary | ICD-10-CM | POA: Insufficient documentation

## 2013-05-15 DIAGNOSIS — J4489 Other specified chronic obstructive pulmonary disease: Secondary | ICD-10-CM | POA: Insufficient documentation

## 2013-05-15 DIAGNOSIS — Z87891 Personal history of nicotine dependence: Secondary | ICD-10-CM | POA: Insufficient documentation

## 2013-05-15 HISTORY — PX: INTRAOPERATIVE TRANSESOPHAGEAL ECHOCARDIOGRAM: SHX5062

## 2013-05-15 LAB — CBC
HCT: 38.7 % — ABNORMAL LOW (ref 39.0–52.0)
Hemoglobin: 12.3 g/dL — ABNORMAL LOW (ref 13.0–17.0)
MCH: 28.6 pg (ref 26.0–34.0)
MCHC: 31.8 g/dL (ref 30.0–36.0)
MCV: 90 fL (ref 78.0–100.0)
Platelets: 160 10*3/uL (ref 150–400)
RBC: 4.3 MIL/uL (ref 4.22–5.81)
RDW: 15.1 % (ref 11.5–15.5)
WBC: 9.8 10*3/uL (ref 4.0–10.5)

## 2013-05-15 LAB — PREPARE RBC (CROSSMATCH)

## 2013-05-15 SURGERY — REDO STERNOTOMY
Anesthesia: General

## 2013-05-15 MED ORDER — NITROGLYCERIN IN D5W 200-5 MCG/ML-% IV SOLN
INTRAVENOUS | Status: DC | PRN
  Administered 2013-05-15: 10 ug/min via INTRAVENOUS

## 2013-05-15 MED ORDER — FUROSEMIDE 10 MG/ML IJ SOLN
20.0000 mg | Freq: Once | INTRAMUSCULAR | Status: AC
  Administered 2013-05-15: 20 mg via INTRAVENOUS
  Filled 2013-05-15: qty 2

## 2013-05-15 MED ORDER — GLYCOPYRROLATE 0.2 MG/ML IJ SOLN
INTRAMUSCULAR | Status: AC
  Filled 2013-05-15: qty 4

## 2013-05-15 MED ORDER — LACTATED RINGERS IV SOLN
INTRAVENOUS | Status: DC | PRN
  Administered 2013-05-15 (×2): via INTRAVENOUS

## 2013-05-15 MED ORDER — LIDOCAINE HCL (CARDIAC) 20 MG/ML IV SOLN
INTRAVENOUS | Status: AC
  Filled 2013-05-15: qty 5

## 2013-05-15 MED ORDER — OXYCODONE HCL 5 MG/5ML PO SOLN: 5.0000 mg | Freq: Once | ORAL | Status: DC | PRN

## 2013-05-15 MED ORDER — TRAMADOL HCL 50 MG PO TABS
50.0000 mg | ORAL_TABLET | Freq: Four times a day (QID) | ORAL | Status: DC | PRN
  Administered 2013-05-15 – 2013-05-16 (×2): 50 mg via ORAL
  Filled 2013-05-15 (×2): qty 1

## 2013-05-15 MED ORDER — GLYCOPYRROLATE 0.2 MG/ML IJ SOLN
INTRAMUSCULAR | Status: AC
  Filled 2013-05-15: qty 1

## 2013-05-15 MED ORDER — MEPERIDINE HCL 25 MG/ML IJ SOLN: 6.2500 mg | INTRAMUSCULAR | Status: DC | PRN

## 2013-05-15 MED ORDER — EPHEDRINE SULFATE 50 MG/ML IJ SOLN
INTRAMUSCULAR | Status: AC
  Filled 2013-05-15: qty 1

## 2013-05-15 MED ORDER — ROCURONIUM BROMIDE 50 MG/5ML IV SOLN
INTRAVENOUS | Status: AC
  Filled 2013-05-15: qty 1

## 2013-05-15 MED ORDER — POTASSIUM CHLORIDE 10 MEQ/50ML IV SOLN
10.0000 meq | Freq: Every day | INTRAVENOUS | Status: DC | PRN
  Filled 2013-05-15: qty 50

## 2013-05-15 MED ORDER — HYDRALAZINE HCL 20 MG/ML IJ SOLN
INTRAMUSCULAR | Status: DC | PRN
  Administered 2013-05-15: 5 mg
  Administered 2013-05-15: 5 mg via INTRAVENOUS

## 2013-05-15 MED ORDER — MIDAZOLAM HCL 5 MG/5ML IJ SOLN
INTRAMUSCULAR | Status: DC | PRN
  Administered 2013-05-15: 2 mg via INTRAVENOUS

## 2013-05-15 MED ORDER — LACTATED RINGERS IV SOLN
INTRAVENOUS | Status: DC | PRN
  Administered 2013-05-15: 07:00:00 via INTRAVENOUS

## 2013-05-15 MED ORDER — LOSARTAN POTASSIUM 25 MG PO TABS
25.0000 mg | ORAL_TABLET | Freq: Every day | ORAL | Status: DC
  Administered 2013-05-16: 25 mg via ORAL
  Filled 2013-05-15: qty 1

## 2013-05-15 MED ORDER — FENTANYL CITRATE 0.05 MG/ML IJ SOLN
INTRAMUSCULAR | Status: DC | PRN
  Administered 2013-05-15 (×3): 50 ug via INTRAVENOUS
  Administered 2013-05-15: 25 ug via INTRAVENOUS
  Administered 2013-05-15: 150 ug via INTRAVENOUS
  Administered 2013-05-15: 50 ug via INTRAVENOUS

## 2013-05-15 MED ORDER — HYDRALAZINE HCL 20 MG/ML IJ SOLN
INTRAMUSCULAR | Status: AC
  Filled 2013-05-15: qty 1

## 2013-05-15 MED ORDER — HYDROMORPHONE HCL PF 1 MG/ML IJ SOLN
INTRAMUSCULAR | Status: AC
  Administered 2013-05-15: 0.5 mg via INTRAVENOUS
  Filled 2013-05-15: qty 1

## 2013-05-15 MED ORDER — FENTANYL CITRATE 0.05 MG/ML IJ SOLN
INTRAMUSCULAR | Status: AC
  Filled 2013-05-15: qty 5

## 2013-05-15 MED ORDER — OXYCODONE HCL 5 MG PO TABS: 5.0000 mg | ORAL_TABLET | Freq: Once | ORAL | Status: DC | PRN

## 2013-05-15 MED ORDER — LIDOCAINE HCL (CARDIAC) 20 MG/ML IV SOLN
INTRAVENOUS | Status: DC | PRN
  Administered 2013-05-15: 100 mg via INTRAVENOUS

## 2013-05-15 MED ORDER — ONDANSETRON HCL 4 MG/2ML IJ SOLN
INTRAMUSCULAR | Status: AC
  Filled 2013-05-15: qty 2

## 2013-05-15 MED ORDER — ACETAMINOPHEN 160 MG/5ML PO SOLN
1000.0000 mg | Freq: Four times a day (QID) | ORAL | Status: DC
  Filled 2013-05-15: qty 40

## 2013-05-15 MED ORDER — SODIUM CHLORIDE 0.9 % IJ SOLN
INTRAMUSCULAR | Status: AC
  Filled 2013-05-15: qty 10

## 2013-05-15 MED ORDER — MIDAZOLAM HCL 2 MG/2ML IJ SOLN
INTRAMUSCULAR | Status: AC
  Filled 2013-05-15: qty 2

## 2013-05-15 MED ORDER — ACETAMINOPHEN 500 MG PO TABS: 500.0000 mg | ORAL_TABLET | Freq: Four times a day (QID) | ORAL | Status: DC | PRN

## 2013-05-15 MED ORDER — ALBUTEROL SULFATE (2.5 MG/3ML) 0.083% IN NEBU: 3.0000 mL | INHALATION_SOLUTION | Freq: Four times a day (QID) | RESPIRATORY_TRACT | Status: DC | PRN

## 2013-05-15 MED ORDER — SUCCINYLCHOLINE CHLORIDE 20 MG/ML IJ SOLN
INTRAMUSCULAR | Status: AC
  Filled 2013-05-15: qty 1

## 2013-05-15 MED ORDER — GLYCOPYRROLATE 0.2 MG/ML IJ SOLN
INTRAMUSCULAR | Status: DC | PRN
  Administered 2013-05-15 (×2): 0.2 mg via INTRAVENOUS
  Administered 2013-05-15: .8 mg via INTRAVENOUS

## 2013-05-15 MED ORDER — IOHEXOL 300 MG/ML  SOLN
INTRAMUSCULAR | Status: DC | PRN
  Administered 2013-05-15: 300 mL via INTRAVENOUS

## 2013-05-15 MED ORDER — HYDROMORPHONE HCL PF 1 MG/ML IJ SOLN
0.2500 mg | INTRAMUSCULAR | Status: DC | PRN
  Administered 2013-05-15 (×2): 0.5 mg via INTRAVENOUS

## 2013-05-15 MED ORDER — ROCURONIUM BROMIDE 100 MG/10ML IV SOLN
INTRAVENOUS | Status: DC | PRN
  Administered 2013-05-15: 10 mg via INTRAVENOUS
  Administered 2013-05-15: 50 mg via INTRAVENOUS
  Administered 2013-05-15 (×2): 10 mg via INTRAVENOUS

## 2013-05-15 MED ORDER — ACETAMINOPHEN 500 MG PO TABS
1000.0000 mg | ORAL_TABLET | Freq: Four times a day (QID) | ORAL | Status: DC
  Administered 2013-05-15 – 2013-05-16 (×4): 1000 mg via ORAL
  Filled 2013-05-15 (×8): qty 2

## 2013-05-15 MED ORDER — PROPOFOL 10 MG/ML IV BOLUS
INTRAVENOUS | Status: DC | PRN
  Administered 2013-05-15: 100 mg via INTRAVENOUS

## 2013-05-15 MED ORDER — POTASSIUM CHLORIDE IN NACL 20-0.9 MEQ/L-% IV SOLN
INTRAVENOUS | Status: DC
  Filled 2013-05-15 (×4): qty 1000

## 2013-05-15 MED ORDER — ONDANSETRON HCL 4 MG/2ML IJ SOLN
INTRAMUSCULAR | Status: DC | PRN
  Administered 2013-05-15: 4 mg via INTRAVENOUS

## 2013-05-15 MED ORDER — ONDANSETRON HCL 4 MG/2ML IJ SOLN: 4.0000 mg | Freq: Four times a day (QID) | INTRAMUSCULAR | Status: DC | PRN

## 2013-05-15 MED ORDER — HEPARIN SODIUM (PORCINE) 1000 UNIT/ML IJ SOLN
INTRAMUSCULAR | Status: DC | PRN
  Administered 2013-05-15: 3 mL via INTRAVENOUS

## 2013-05-15 MED ORDER — DEXTROSE 5 % IV SOLN
1.5000 g | Freq: Two times a day (BID) | INTRAVENOUS | Status: AC
  Administered 2013-05-15 – 2013-05-16 (×2): 1.5 g via INTRAVENOUS
  Filled 2013-05-15 (×3): qty 1.5

## 2013-05-15 MED ORDER — SENNOSIDES-DOCUSATE SODIUM 8.6-50 MG PO TABS
1.0000 | ORAL_TABLET | Freq: Every day | ORAL | Status: DC
  Administered 2013-05-15: 1 via ORAL
  Filled 2013-05-15 (×2): qty 1

## 2013-05-15 MED ORDER — NEOSTIGMINE METHYLSULFATE 1 MG/ML IJ SOLN
INTRAMUSCULAR | Status: DC | PRN
  Administered 2013-05-15: 5 mg via INTRAVENOUS

## 2013-05-15 MED ORDER — BUDESONIDE-FORMOTEROL FUMARATE 160-4.5 MCG/ACT IN AERO
2.0000 | INHALATION_SPRAY | Freq: Two times a day (BID) | RESPIRATORY_TRACT | Status: DC
  Administered 2013-05-15 – 2013-05-16 (×2): 2 via RESPIRATORY_TRACT
  Filled 2013-05-15: qty 6

## 2013-05-15 MED ORDER — PROPOFOL 10 MG/ML IV BOLUS
INTRAVENOUS | Status: AC
  Filled 2013-05-15: qty 20

## 2013-05-15 MED ORDER — BISACODYL 5 MG PO TBEC: 10.0000 mg | DELAYED_RELEASE_TABLET | Freq: Every day | ORAL | Status: DC

## 2013-05-15 MED ORDER — NITROGLYCERIN IN D5W 200-5 MCG/ML-% IV SOLN: 2.0000 ug/min | INTRAVENOUS | Status: DC

## 2013-05-15 MED ORDER — PHENYLEPHRINE 40 MCG/ML (10ML) SYRINGE FOR IV PUSH (FOR BLOOD PRESSURE SUPPORT)
PREFILLED_SYRINGE | INTRAVENOUS | Status: AC
  Filled 2013-05-15: qty 10

## 2013-05-15 MED ORDER — NEOSTIGMINE METHYLSULFATE 1 MG/ML IJ SOLN
INTRAMUSCULAR | Status: AC
  Filled 2013-05-15: qty 10

## 2013-05-15 MED ORDER — ONDANSETRON HCL 4 MG/2ML IJ SOLN: 4.0000 mg | Freq: Once | INTRAMUSCULAR | Status: DC | PRN

## 2013-05-15 MED ORDER — FENTANYL CITRATE 0.05 MG/ML IJ SOLN
25.0000 ug | INTRAMUSCULAR | Status: DC | PRN
  Administered 2013-05-15: 50 ug via INTRAVENOUS
  Filled 2013-05-15: qty 2

## 2013-05-15 MED FILL — Electrolyte-R (PH 7.4) Solution: INTRAVENOUS | Qty: 3000 | Status: AC

## 2013-05-15 MED FILL — Sodium Chloride IV Soln 0.9%: INTRAVENOUS | Qty: 2000 | Status: AC

## 2013-05-15 SURGICAL SUPPLY — 118 items
10 FR SHEATH ×3 IMPLANT
35MM SNARE ×3 IMPLANT
5.0 SOS OMNI FLUSH ×4 IMPLANT
6 FR PINNACLE ×4 IMPLANT
7.0 ANGLED PIG ×3 IMPLANT
A CATH 2-3 ×3 IMPLANT
ADAPTER CARDIO PERF ANTE/RETRO (ADAPTER) IMPLANT
ANGIO FLUSH SUPPLY ×4 IMPLANT
APPLIER CLIP 9.375 MED OPEN (MISCELLANEOUS)
APPLIER CLIP 9.375 SM OPEN (CLIP)
ARROW 7FR 65CM ×4 IMPLANT
ATTRACTOMAT 16X20 MAGNETIC DRP (DRAPES) ×4 IMPLANT
BAG DECANTER FOR FLEXI CONT (MISCELLANEOUS) ×4 IMPLANT
BANDAGE ELASTIC 4 VELCRO ST LF (GAUZE/BANDAGES/DRESSINGS) ×4 IMPLANT
BANDAGE ELASTIC 6 VELCRO ST LF (GAUZE/BANDAGES/DRESSINGS) ×4 IMPLANT
BANDAGE GAUZE ELAST BULKY 4 IN (GAUZE/BANDAGES/DRESSINGS) ×4 IMPLANT
BLADE 10 SAFETY STRL DISP (BLADE) ×4 IMPLANT
BLADE CORE FAN STRYKER (BLADE) ×8 IMPLANT
BLADE OSC/SAG .038X5.5 CUT EDG (BLADE) ×4 IMPLANT
BLADE SAW SAG 29X58X.64 (BLADE) ×4 IMPLANT
BLADE STERNUM SYSTEM 6 (BLADE) ×4 IMPLANT
BLADE SURG 11 STRL SS (BLADE) IMPLANT
BLADE SURG 12 STRL SS (BLADE) ×4 IMPLANT
BLADE SURG ROTATE 9660 (MISCELLANEOUS) ×4 IMPLANT
CANISTER SUCTION 2500CC (MISCELLANEOUS) ×4 IMPLANT
CANNULA GUNDRY RCSP 15FR (MISCELLANEOUS) IMPLANT
CARDIAC SUCTION (MISCELLANEOUS) ×4 IMPLANT
CATH RETROPLEGIA CORONARY 14FR (CATHETERS) IMPLANT
CATH ROBINSON RED A/P 18FR (CATHETERS) IMPLANT
CATH THORACIC 28FR (CATHETERS) ×4 IMPLANT
CATH THORACIC 28FR RT ANG (CATHETERS) ×4 IMPLANT
CATH THORACIC 36FR (CATHETERS) ×4 IMPLANT
CLIP APPLIE 9.375 MED OPEN (MISCELLANEOUS) IMPLANT
CLIP APPLIE 9.375 SM OPEN (CLIP) IMPLANT
CLIP FOGARTY SPRING 6M (CLIP) IMPLANT
CLIP TI MEDIUM 24 (CLIP) IMPLANT
CLIP TI WIDE RED SMALL 24 (CLIP) IMPLANT
CONN Y 3/8X3/8X3/8  BEN (MISCELLANEOUS)
CONN Y 3/8X3/8X3/8 BEN (MISCELLANEOUS) IMPLANT
CORONARY SUCKER SOFT TIP 10052 (MISCELLANEOUS) IMPLANT
COVER SURGICAL LIGHT HANDLE (MISCELLANEOUS) ×8 IMPLANT
CRADLE DONUT ADULT HEAD (MISCELLANEOUS) ×4 IMPLANT
DRAPE CARDIOVASCULAR INCISE (DRAPES) ×2
DRAPE SLUSH/WARMER DISC (DRAPES) IMPLANT
DRAPE SRG 135X102X78XABS (DRAPES) ×2 IMPLANT
DRAPE TABLE COVER HEAVY DUTY (DRAPES) ×4 IMPLANT
DRSG AQUACEL AG ADV 3.5X14 (GAUZE/BANDAGES/DRESSINGS) ×4 IMPLANT
DRSG TEGADERM 4X4.75 (GAUZE/BANDAGES/DRESSINGS) ×8 IMPLANT
ELECT BLADE 6.5 EXT (BLADE) ×4 IMPLANT
ELECT CAUTERY BLADE 6.4 (BLADE) ×4 IMPLANT
ELECT REM PT RETURN 9FT ADLT (ELECTROSURGICAL) ×4
ELECTRODE REM PT RTRN 9FT ADLT (ELECTROSURGICAL) ×2 IMPLANT
EN SNARE 12-20MM ×3 IMPLANT
GAUZE XEROFORM 1X8 LF (GAUZE/BANDAGES/DRESSINGS) ×4 IMPLANT
GLOVE BIO SURGEON STRL SZ 6 (GLOVE) ×4 IMPLANT
GLOVE BIO SURGEON STRL SZ 6.5 (GLOVE) ×3 IMPLANT
GLOVE BIO SURGEON STRL SZ7 (GLOVE) ×4 IMPLANT
GLOVE BIO SURGEON STRL SZ7.5 (GLOVE) ×4 IMPLANT
GLOVE BIO SURGEONS STRL SZ 6.5 (GLOVE) ×1
GLOVE BIOGEL M 7.0 STRL (GLOVE) ×4 IMPLANT
GLOVE BIOGEL PI IND STRL 6 (GLOVE) ×2 IMPLANT
GLOVE BIOGEL PI IND STRL 6.5 (GLOVE) ×8 IMPLANT
GLOVE BIOGEL PI IND STRL 7.0 (GLOVE) ×4 IMPLANT
GLOVE BIOGEL PI IND STRL 7.5 (GLOVE) ×2 IMPLANT
GLOVE BIOGEL PI INDICATOR 6 (GLOVE) ×2
GLOVE BIOGEL PI INDICATOR 6.5 (GLOVE) ×8
GLOVE BIOGEL PI INDICATOR 7.0 (GLOVE) ×4
GLOVE BIOGEL PI INDICATOR 7.5 (GLOVE) ×2
GLOVE ECLIPSE 7.0 STRL STRAW (GLOVE) ×4 IMPLANT
GLOVE EUDERMIC 7 POWDERFREE (GLOVE) ×4 IMPLANT
GOWN STRL REUS W/ TWL LRG LVL3 (GOWN DISPOSABLE) ×12 IMPLANT
GOWN STRL REUS W/TWL LRG LVL3 (GOWN DISPOSABLE) ×12
HEMOSTAT POWDER SURGIFOAM 1G (HEMOSTASIS) IMPLANT
HEMOSTAT SURGICEL 2X14 (HEMOSTASIS) IMPLANT
I ACCESSORY PK ×4 IMPLANT
INSERT FOGARTY XLG (MISCELLANEOUS) IMPLANT
INTRODUCER AVANTI 5FR (MISCELLANEOUS) ×8 IMPLANT
KIT BASIN OR (CUSTOM PROCEDURE TRAY) ×4 IMPLANT
KIT ROOM TURNOVER OR (KITS) ×4 IMPLANT
KIT SUCTION CATH 14FR (SUCTIONS) IMPLANT
KUMPE 65CM ×4 IMPLANT
MARKER GRAFT CORONARY BYPASS (MISCELLANEOUS) IMPLANT
MICRO PUNCTURE STIFF 5FR ×4 IMPLANT
NEEDLE PERC 18GX7CM (NEEDLE) ×4 IMPLANT
NS IRRIG 1000ML POUR BTL (IV SOLUTION) ×12 IMPLANT
PACK OPEN HEART (CUSTOM PROCEDURE TRAY) ×4 IMPLANT
PAD ARMBOARD 7.5X6 YLW CONV (MISCELLANEOUS) ×8 IMPLANT
PAD DEFIB R2 (MISCELLANEOUS) ×4 IMPLANT
PAD ELECT DEFIB RADIOL ZOLL (MISCELLANEOUS) ×4 IMPLANT
ROAD RUNNER .035 ×8 IMPLANT
SET CARDIOPLEGIA MPS 5001102 (MISCELLANEOUS) ×4 IMPLANT
SHEATH BRITE TIP 8FR 35CM (SHEATH) ×4 IMPLANT
SNARE GOOSENECK 10MM (VASCULAR PRODUCTS) ×4 IMPLANT
SPONGE GAUZE 4X4 12PLY (GAUZE/BANDAGES/DRESSINGS) ×12 IMPLANT
STOPCOCK MORSE 400PSI 3WAY (MISCELLANEOUS) ×4 IMPLANT
SUT PROLENE 5 0 C 1 36 (SUTURE) ×8 IMPLANT
SUT PROLENE 6 0 C 1 30 (SUTURE) ×8 IMPLANT
SUT SILK 2 0 SH CR/8 (SUTURE) ×8 IMPLANT
SUT SILK 2 0 TIES 10X30 (SUTURE) ×4 IMPLANT
SUT SILK 3 0 SH CR/8 (SUTURE) ×4 IMPLANT
SUT SILK 3 0 TIES 10X30 (SUTURE) ×8 IMPLANT
SUT VIC AB 0 CT1 18XCR BRD 8 (SUTURE) ×2 IMPLANT
SUT VIC AB 0 CT1 8-18 (SUTURE) ×2
SUT VIC AB 2-0 CT1 27 (SUTURE) ×2
SUT VIC AB 2-0 CT1 TAPERPNT 27 (SUTURE) ×2 IMPLANT
SUT VIC AB 3-0 SH 27 (SUTURE) ×2
SUT VIC AB 3-0 SH 27X BRD (SUTURE) ×2 IMPLANT
SUT VIC AB 3-0 X1 27 (SUTURE) ×4 IMPLANT
SUT VICRYL 4-0 PS2 18IN ABS (SUTURE) ×4 IMPLANT
SYSTEM SAHARA CHEST DRAIN ATS (WOUND CARE) ×4 IMPLANT
TAPE CLOTH SURG 4X10 WHT LF (GAUZE/BANDAGES/DRESSINGS) ×4 IMPLANT
TOWEL OR 17X24 6PK STRL BLUE (TOWEL DISPOSABLE) ×4 IMPLANT
TOWEL OR 17X26 10 PK STRL BLUE (TOWEL DISPOSABLE) ×4 IMPLANT
TRAY FOLEY IC TEMP SENS 16FR (CATHETERS) ×4 IMPLANT
TRAY/NEURO ×4 IMPLANT
UNDERPAD 30X30 INCONTINENT (UNDERPADS AND DIAPERS) ×4 IMPLANT
WATER STERILE IRR 1000ML POUR (IV SOLUTION) ×4 IMPLANT
WIRE ROSEN-J .035X260CM (WIRE) ×4 IMPLANT

## 2013-05-15 NOTE — Anesthesia Postprocedure Evaluation (Signed)
Anesthesia Post Note  Patient: Gregory Turner  Procedure(s) Performed: Procedure(s) (LRB): Right neck exposure for catheter removal; Transvenous minimally invasive extraction of pulmonary artery catheter segment; CPB pump standby (N/A) INTRAOPERATIVE TRANSESOPHAGEAL ECHOCARDIOGRAM (N/A)  Anesthesia type: general  Patient location: PACU  Post pain: Pain level controlled  Post assessment: Patient's Cardiovascular Status Stable  Last Vitals:  Filed Vitals:   05/15/13 1300  BP: 125/63  Pulse: 54  Temp:   Resp: 13    Post vital signs: Reviewed and stable  Level of consciousness: sedated  Complications: No apparent anesthesia complications

## 2013-05-15 NOTE — Anesthesia Preprocedure Evaluation (Addendum)
Anesthesia Evaluation  Patient identified by MRN, date of birth, ID band Patient awake    Reviewed: Allergy & Precautions, H&P , NPO status , Patient's Chart, lab work & pertinent test results, reviewed documented beta blocker date and time   Airway Mallampati: I TM Distance: >3 FB Neck ROM: Full    Dental  (+) Teeth Intact   Pulmonary shortness of breath and with exertion, COPD COPD inhaler, former smoker,          Cardiovascular hypertension, Pt. on medications and Pt. on home beta blockers + CAD and + Peripheral Vascular Disease + dysrhythmias Atrial Fibrillation     Neuro/Psych    GI/Hepatic   Endo/Other    Renal/GU      Musculoskeletal   Abdominal   Peds  Hematology   Anesthesia Other Findings   Reproductive/Obstetrics                          Anesthesia Physical Anesthesia Plan  ASA: III  Anesthesia Plan: General   Post-op Pain Management:    Induction: Intravenous  Airway Management Planned: Oral ETT  Additional Equipment: Arterial line  Intra-op Plan:   Post-operative Plan: Extubation in OR and Possible Post-op intubation/ventilation  Informed Consent: I have reviewed the patients History and Physical, chart, labs and discussed the procedure including the risks, benefits and alternatives for the proposed anesthesia with the patient or authorized representative who has indicated his/her understanding and acceptance.   Dental advisory given  Plan Discussed with: CRNA and Surgeon  Anesthesia Plan Comments:        Anesthesia Quick Evaluation

## 2013-05-15 NOTE — Progress Notes (Signed)
CT surgery p.m. Rounds  Status post right neck exposure and transvenous removal of retained pulmonary catheter earlier today  Patient extubated sitting up in bed with stable hemodynamics Lungs clear P.m. Hemoglobin 12.3 Input-output balance positive-Lasix ordered 20 mg IV

## 2013-05-15 NOTE — Brief Op Note (Signed)
05/15/2013  11:10 AM  PATIENT:  Gillie Manners  73 y.o. male  PRE-OPERATIVE DIAGNOSIS:  Retained pulmonary artery catheter  POST-OPERATIVE DIAGNOSIS:  Retained pulmonary artery catheter  PROCEDURE:  Procedure(s): Right neck exposure for catheter removal; Transvenous minimally invasive extraction of pulmonary artery catheter segment; CPB pump standby (N/A) INTRAOPERATIVE TRANSESOPHAGEAL ECHOCARDIOGRAM (N/A)  SURGEON:  Surgeon(s) and Role:    * Azzie Roup, MD - Assisting    * Ivin Poot, MD - Primary  PHYSICIAN ASSISTANT: na  ASSISTANTS: none   ANESTHESIA:   general  EBL:  Total I/O In: 2500 [I.V.:2500] Out: 600 [Urine:600]  BLOOD ADMINISTERED:none  DRAINS: none   LOCAL MEDICATIONS USED:  NONE  SPECIMEN:  No Specimen  DISPOSITION OF SPECIMEN:  PATHOLOGY  COUNTS:  YES  TOURNIQUET:  * No tourniquets in log *  DICTATION: .Dragon Dictation  PLAN OF CARE: Admit for overnight observation  PATIENT DISPOSITION:  PACU - hemodynamically stable.   Delay start of Pharmacological VTE agent (>24hrs) due to surgical blood loss or risk of bleeding: yes

## 2013-05-15 NOTE — Transfer of Care (Signed)
Immediate Anesthesia Transfer of Care Note  Patient: Gregory Turner  Procedure(s) Performed: Procedure(s): Right neck exposure for catheter removal; Transvenous minimally invasive extraction of pulmonary artery catheter segment; CPB pump standby (N/A) INTRAOPERATIVE TRANSESOPHAGEAL ECHOCARDIOGRAM (N/A)  Patient Location: PACU  Anesthesia Type:General  Level of Consciousness: awake, alert , oriented and patient cooperative  Airway & Oxygen Therapy: Patient Spontanous Breathing and Patient connected to nasal cannula oxygen  Post-op Assessment: Report given to PACU RN, Post -op Vital signs reviewed and stable and Patient moving all extremities X 4  Post vital signs: Reviewed and stable  Complications: No apparent anesthesia complications

## 2013-05-15 NOTE — Progress Notes (Signed)
The patient was examined and preop studies reviewed. There has been no change from the prior exam and the patient is ready for surgery.  Plan removal of retained PA catheter today on E Dissinger Heart-lung bypass standby

## 2013-05-15 NOTE — Progress Notes (Signed)
Patient ID: Gregory Turner, male   DOB: 1940/07/12, 73 y.o.   MRN: [id number]  Patient doing well after procedure this morning.  No chest pain.  Mild pain right neck at level of cut down.  VSS.  No ectopy.  Right groin:  Soft and nontender.  No hematoma.

## 2013-05-15 NOTE — Anesthesia Procedure Notes (Addendum)
Procedure Name: Intubation Date/Time: 05/15/2013 7:57 AM Performed by: Carney Living Pre-anesthesia Checklist: Patient identified, Emergency Drugs available, Suction available, Patient being monitored and Timeout performed Patient Re-evaluated:Patient Re-evaluated prior to inductionOxygen Delivery Method: Circle system utilized Preoxygenation: Pre-oxygenation with 100% oxygen Intubation Type: IV induction Ventilation: Mask ventilation without difficulty Grade View: Grade I Tube type: Oral Tube size: 8.0 mm Number of attempts: 1 Airway Equipment and Method: Stylet Placement Confirmation: ETT inserted through vocal cords under direct vision,  positive ETCO2 and breath sounds checked- equal and bilateral Secured at: 23 cm Tube secured with: Tape Dental Injury: Teeth and Oropharynx as per pre-operative assessment

## 2013-05-15 NOTE — Progress Notes (Signed)
Echo Lab  Transesophageal Echocardiogram completed.  [place], RDCS 05/15/2013 8:50 AM

## 2013-05-16 ENCOUNTER — Observation Stay (HOSPITAL_COMMUNITY): Payer: Medicare PPO

## 2013-05-16 LAB — CBC
HCT: 42 % (ref 39.0–52.0)
Hemoglobin: 13.2 g/dL (ref 13.0–17.0)
MCH: 29 pg (ref 26.0–34.0)
MCHC: 31.4 g/dL (ref 30.0–36.0)
MCV: 92.3 fL (ref 78.0–100.0)
Platelets: 172 10*3/uL (ref 150–400)
RBC: 4.55 MIL/uL (ref 4.22–5.81)
RDW: 15.1 % (ref 11.5–15.5)
WBC: 7 10*3/uL (ref 4.0–10.5)

## 2013-05-16 LAB — BLOOD GAS, ARTERIAL
Acid-Base Excess: 2.4 mmol/L — ABNORMAL HIGH (ref 0.0–2.0)
Bicarbonate: 26.6 mEq/L — ABNORMAL HIGH (ref 20.0–24.0)
Drawn by: 31814
O2 Content: 3 L/min
O2 Saturation: 97.9 %
Patient temperature: 98.6
TCO2: 27.9 mmol/L (ref 0–100)
pCO2 arterial: 42.7 mmHg (ref 35.0–45.0)
pH, Arterial: 7.411 (ref 7.350–7.450)
pO2, Arterial: 98.3 mmHg (ref 80.0–100.0)

## 2013-05-16 LAB — TYPE AND SCREEN
ABO/RH(D): B POS
Antibody Screen: NEGATIVE
Unit division: 0
Unit division: 0
Unit division: 0
Unit division: 0

## 2013-05-16 LAB — BASIC METABOLIC PANEL
BUN: 14 mg/dL (ref 6–23)
CO2: 28 mEq/L (ref 19–32)
Calcium: 9 mg/dL (ref 8.4–10.5)
Chloride: 103 mEq/L (ref 96–112)
Creatinine, Ser: 0.93 mg/dL (ref 0.50–1.35)
GFR calc Af Amer: >90 mL/min (ref >90)
GFR calc non Af Amer: 82 mL/min — ABNORMAL LOW (ref >90)
Glucose, Bld: 105 mg/dL — ABNORMAL HIGH (ref 70–99)
Potassium: 4.3 mEq/L (ref 3.7–5.3)
Sodium: 143 mEq/L (ref 137–147)

## 2013-05-16 MED ORDER — TRAMADOL HCL 50 MG PO TABS: 50.0000 mg | ORAL_TABLET | Freq: Four times a day (QID) | ORAL | Status: DC | PRN

## 2013-05-16 MED ORDER — FUROSEMIDE 10 MG/ML IJ SOLN
20.0000 mg | Freq: Once | INTRAMUSCULAR | Status: AC
  Administered 2013-05-16: 20 mg via INTRAVENOUS
  Filled 2013-05-16: qty 2

## 2013-05-16 NOTE — Progress Notes (Signed)
Pt ambulating hall independently.  RA SPO2 after walk 93%.  Foley DC'd, await void for probable discharge home this afternoon.

## 2013-05-16 NOTE — Op Note (Signed)
NAMESTEEN, BISIG NO.:  [id number]  MEDICAL RECORD NO.:  [id number]  LOCATION:  2S07C                        FACILITY:  [facility]  PHYSICIAN:  Ivin Poot, M.D.  DATE OF BIRTH:  December 17, 1940  DATE OF PROCEDURE:  05/15/2013 DATE OF DISCHARGE:                              OPERATIVE REPORT   OPERATION:  Right neck exposure for removal of retained pulmonary artery catheter.  SURGEON:  Ivin Poot, M.D.  ANESTHESIA:  General.  PREOPERATIVE DIAGNOSIS:  Retained pulmonary artery catheter after redo dental, coronary artery bypass graft and replacement of ascending thoracic aneurysm.  POSTOPERATIVE DIAGNOSIS:  Retained pulmonary artery catheter after redo dental, coronary artery bypass graft and replacement of ascending thoracic aneurysm.  CLINICAL NOTE:  The patient returns almost 5 months postop for transvascular removal of retained pulmonary artery catheter by Interventional Radiology.  A separate operative note will be dictated by Dr. Aletta Edouard.  I provided cardiopulmonary bypass backup and exposure of the proximal aspect of the right PA catheter through a small neck exposure.  I discussed the procedure in detail with the patient and his family including the expected benefits of removal of the pulmonary artery catheter, the associated risks and alternatives of leaving it intact without surgery.  They wished to proceed with transvenous percutaneous extraction.  They understood that we would not do a third time sternotomy unless there was a life-threatening condition such as bleeding.  DESCRIPTION OF PROCEDURE:  The patient was brought to the operating room and placed supine on the operating room table where general anesthesia was induced under invasive hemodynamic monitoring.  The chest, abdomen, and legs were prepped and draped as a sterile field.  A proper time-out was performed.  I placed a left femoral A-line microsheath for exposure of the  femoral artery in case emergency cardiopulmonary bypass was needed.  A small incision was made parallel to the sternocleidomastoid muscle to attempt exposure of the proximal extent of the pulmonary artery catheter.  The dissection was carried down beneath the clavicle along the anterolateral aspect of the jugular vein.  However, the catheter was not identified and under fluoroscopy, it appeared that the catheter had retracted back into the mediastinum.  Rather than, blindly dissecting in the mediastinum from the supraclavicular approach.  I packed the wound and the proximal portion of the catheter was then extracted by the interventional radiologist under fluoroscopic guidance and endovascular catheters as described in a separate operative note.  After the catheter had been successfully extracted by Dr. Kathlene Cote, the right neck incision was irrigated.  Hemostasis was achieved.  I closed the incision in two layers using interrupted 0 Vicryl for the muscle, interrupted 3-0 Vicryl for the subcutaneous layer and a running subcuticular skin closure.  Sterile dressings were applied.  The patient was extubated.  A chest x-ray taken in the operating room showed no evidence of pneumothorax or hemothorax.     Ivin Poot, M.D.     PV/MEDQ  D:  05/15/2013  T:  05/16/2013  Job:  [id number]

## 2013-05-16 NOTE — Addendum Note (Signed)
Addendum created 05/16/13 1317 by Carney Living, CRNA   Modules edited: Anesthesia Medication Administration

## 2013-05-16 NOTE — Discharge Instructions (Signed)
Sutured Wound Care Sutures are stitches that can be used to close wounds. Caring for your wound can help stop infection and lessen pain. HOME CARE   Rest and raise (elevate) the injured area until the pain and puffiness (swelling) go away.  Only take medicines as told by your doctor.  Clean the wound gently with mild soap and water once a day after the first 2 days. Rinse off the soap. Pat the area dry with a clean towel. Do not rub the wound.  Change the bandage (dressing) as told by your doctor. If the bandage sticks, soak it off with soapy water. Stop using a bandage after 2 days or after the wound stops leaking fluid.  Put cream on the wound as told by your doctor.  Do not stretch the wound.  Drink enough fluids to keep your pee (urine) clear or pale yellow.  See your doctor to have the sutures removed.  Use sunscreen or sunblock on the wound after it heals. GET HELP RIGHT AWAY IF:   Your wound gets red, puffy, hot, or tender.  You have more pain in the wound.  You have a red streak that goes away from the wound.  You see yellowish-white fluid (pus) coming out of the wound.  You have a fever.  You have chills and start to shake.  You notice a bad smell coming from the wound.  Your wound will not stop bleeding. MAKE SURE YOU:   Understand these instructions.  Will watch your condition.  Will get help right away if you are not doing well or get worse. Document Released: 06/23/2007 Document Revised: 03/29/2011 Document Reviewed: 05/10/2010 [facility] Patient Information 2014 [place], [place].

## 2013-05-16 NOTE — Discharge Summary (Signed)
Physician Discharge Summary  Patient ID: Gregory Turner MRN: [id number] DOB/AGE: 72-Nov-1942 72 y.o.  Admit date: 05/15/2013 Discharge date: 05/16/2013  Admission Diagnoses:  Patient Active Problem List   Diagnosis Date Noted  . Retained plastic fragment 05/15/2013  . Aneurysm, ascending aorta 11/28/2012  . PAD (peripheral artery disease) 08/02/2012  . Leg weakness 07/19/2011  . Leaking abdominal aortic aneurysm 05/19/2011  . AAA (abdominal aortic aneurysm) 05/17/2011  . Hyperlipidemia 05/17/2011  . Chest discomfort 04/23/2011  . Atrial fibrillation 04/23/2011  . Bruit 04/23/2011  . S/P AVR (aortic valve replacement) 04/23/2011  . Smoking 04/23/2011   Discharge Diagnoses:   Patient Active Problem List   Diagnosis Date Noted  . Retained plastic fragment 05/15/2013  . Aneurysm, ascending aorta 11/28/2012  . PAD (peripheral artery disease) 08/02/2012  . Leg weakness 07/19/2011  . Leaking abdominal aortic aneurysm 05/19/2011  . AAA (abdominal aortic aneurysm) 05/17/2011  . Hyperlipidemia 05/17/2011  . Chest discomfort 04/23/2011  . Atrial fibrillation 04/23/2011  . Bruit 04/23/2011  . S/P AVR (aortic valve replacement) 04/23/2011  . Smoking 04/23/2011   Discharged Condition: good  History of Present Illness:   [name] is a 73 yo white male well known to [facility].  He is S/P Redo AVR Bentall done on 12/11/2012.  The patient did well post operatively.  However when nurses attempted to remove his PA catheter it would not come out.   Further attempts were unsuccessful.  Therefore, it was decided to remove the catheter at a later date.  The patient has done well over the past several months.  He originally did not wish to participate in cardiac rehab, however after he attempted to play golf he became fatigued and had back pain.  Due to this he was in agreement that he would benefit from a structured cardiac rehabilitation program.  He had undergone repeat ECHO which showed a normal  functioning AVR without Aortic insufficiency or significant valvular gradient.  Finally repeat CT scan of the chest showed an intact repair of his aortic root and ascending thoracic aorta.  It also showed a 6 mm pulmonary nodule in the right upper lobe which had remained unchanged from previous study.  The patient presented to his 4 month follow up appointment with Dr. Prescott Turner on 05/14/2013.  At that time the patient stated he wished to undergo removal on the retained PA catheter.  It was felt this was a reasonable request due to long term risk of thromboembolic event.  It was felt this could be done via a minimally invasive approach with the assistance of interventional radiology and a possible right neck incision if necessary.  The risks and benefits of the procedure were explained to the patient and she was agreeable to proceed.   Hospital Course:   [name] presented to [facility] on 05/15/2013.  He was taken to the operating room and underwent Right neck exposure and transvenous minimally invasive extraction of pulmonary artery catheter segment.  The patient tolerated the procedure without difficulty.  The patient was extubated and taken to the SICU in stable condition for overnight observation.  The patient has done well overnight.  Follow up post operative CXR is stable in appearance with some mild interstitial edema and a small right pleural effusion.  The patient's foley catheter will be removed.  He will be monitored to make sure he is able to void post catheter removal.  His oxygen will be weaned as tolerated.  Once both of this  is achieved we anticipate discharge home in the next 24-48 hours.  Treatments: surgery:   Right neck exposure for removal of retained pulmonary artery catheter.   Disposition: Home  Discharge Medications:     Medication List         acetaminophen 500 MG tablet  Commonly known as:  TYLENOL  Take 500 mg by mouth every 6 (six) hours as needed. For pain      albuterol 108 (90 BASE) MCG/ACT inhaler  Commonly known as:  PROVENTIL HFA;VENTOLIN HFA  Inhale 1 puff into the lungs every 6 (six) hours as needed for wheezing or shortness of breath.     amiodarone 200 MG tablet  Commonly known as:  PACERONE  Take 200 mg by mouth daily.     aspirin 81 MG tablet  Take 81 mg by mouth daily.     budesonide-formoterol 160-4.5 MCG/ACT inhaler  Commonly known as:  SYMBICORT  Inhale 2 puffs into the lungs 2 (two) times daily.     metoprolol tartrate 25 MG tablet  Commonly known as:  LOPRESSOR  Take 12.5 mg by mouth daily.     traMADol 50 MG tablet  Commonly known as:  ULTRAM  Take 1-2 tablets (50-100 mg total) by mouth every 6 (six) hours as needed (mild pain).            Future Appointments Provider Department Dept Phone   08/08/2013 8:30 AM [facility] [place] [phone]   Eat a light meal the night before the exam Nothing to eat or drink for at least 8 hours before the exam No Turner chewing, or smoking the morning of the exam Please take your morning medications with small sips of water, especially blood pressure medication *Very Important* Please wear 2 piece clothing.   08/08/2013 9:00 AM Angelia Mould, MD [facility] [phone]      Signed: Ellwood Handler 05/16/2013, 9:11 AM

## 2013-05-16 NOTE — Progress Notes (Signed)
SPO2 at rest on 1L O2 = 94%.  RN removed oxygen and kept pulse ox on for 5 mins.  SPO2 at rest on RA 91-94%.  Pt left on RA, encouraged remaining sitting up and using IS q88mins.  Pt and wife voiced understanding.  Will con't plan of care.

## 2013-05-16 NOTE — Progress Notes (Signed)
1 Day Post-Op Procedure(s) (LRB): Right neck exposure for catheter removal; Transvenous minimally invasive extraction of pulmonary artery catheter segment; CPB pump standby (N/A) INTRAOPERATIVE TRANSESOPHAGEAL ECHOCARDIOGRAM (N/A) Subjective: Doing well after IR extraction of PA cath Needs 2 L [place] for sat> 90% CXR baseline, Hct stable Objective: Vital signs in last 24 hours: Temp:  [97.5 F (36.4 C)-98 F (36.7 C)] 97.9 F (36.6 C) (04/29 0729) Pulse Rate:  [42-66] 55 (04/29 0700) Cardiac Rhythm:  [-] Sinus bradycardia (04/29 0734) Resp:  [8-19] 18 (04/29 0700) BP: (125-168)/(63-88) 145/68 mmHg (04/29 0700) SpO2:  [92 %-100 %] 92 % (04/29 0700) Arterial Line BP: (129-175)/(65-80) 163/71 mmHg (04/28 1800) Weight:  [192 lb 3.9 oz (87.2 kg)] 192 lb 3.9 oz (87.2 kg) (04/28 1500)  Hemodynamic parameters for last 24 hours:  nsr  Intake/Output from previous day: 04/28 0701 - 04/29 0700 In: 3488.1 [P.O.:480; I.V.:2908.1; IV Piggyback:100] Out: 4765 [Urine:4115] Intake/Output this shift:    Neck, groins w/o hematoma  Lab Results:  Recent Labs  05/15/13 1630 05/16/13 0315  WBC 9.8 7.0  HGB 12.3* 13.2  HCT 38.7* 42.0  PLT 160 172   BMET:  Recent Labs  05/16/13 0315  NA 143  K 4.3  CL 103  CO2 28  GLUCOSE 105*  BUN 14  CREATININE 0.93  CALCIUM 9.0    PT/INR: No results found for this basename: LABPROT, INR,  in the last 72 hours ABG    Component Value Date/Time   PHART 7.411 05/16/2013 0600   HCO3 26.6* 05/16/2013 0600   TCO2 27.9 05/16/2013 0600   ACIDBASEDEF 0.1 05/11/2013 1341   O2SAT 97.9 05/16/2013 0600   CBG (last 3)  No results found for this basename: GLUCAP,  in the last 72 hours  Assessment/Plan: S/P Procedure(s) (LRB): Right neck exposure for catheter removal; Transvenous minimally invasive extraction of pulmonary artery catheter segment; CPB pump standby (N/A) INTRAOPERATIVE TRANSESOPHAGEAL ECHOCARDIOGRAM (N/A) Plan for transfer to step-down: see  transfer orders Wean off O2 and confirm patient can void after Foley out May be ready for DC afternoon  LOS: 1 day    Tharon Aquas [facility] 05/16/2013

## 2013-05-16 NOTE — Progress Notes (Signed)
UR Completed.  Fredderick Swanger Jane Javonni Macke [phone] 05/16/2013  

## 2013-05-17 ENCOUNTER — Encounter (HOSPITAL_COMMUNITY): Payer: Self-pay | Admitting: Cardiothoracic Surgery

## 2013-05-18 ENCOUNTER — Encounter (HOSPITAL_COMMUNITY): Payer: Self-pay | Admitting: Cardiothoracic Surgery

## 2013-05-18 NOTE — Discharge Summary (Signed)
patient examined and medical record reviewed,agree with above note. Gregory Turner 05/18/2013   

## 2013-05-21 LAB — POCT I-STAT 3, ART BLOOD GAS (G3+)
Acid-Base Excess: 4 mmol/L — ABNORMAL HIGH (ref 0.0–2.0)
Bicarbonate: 28.9 mEq/L — ABNORMAL HIGH (ref 20.0–24.0)
O2 Saturation: 100 %
TCO2: 30 mmol/L (ref 0–100)
pCO2 arterial: 43.8 mmHg (ref 35.0–45.0)
pH, Arterial: 7.427 (ref 7.350–7.450)
pO2, Arterial: 332 mmHg — ABNORMAL HIGH (ref 80.0–100.0)

## 2013-05-21 LAB — POCT I-STAT 4, (NA,K, GLUC, HGB,HCT)
Glucose, Bld: 102 mg/dL — ABNORMAL HIGH (ref 70–99)
HCT: 38 % — ABNORMAL LOW (ref 39.0–52.0)
Hemoglobin: 12.9 g/dL — ABNORMAL LOW (ref 13.0–17.0)
Potassium: 3.8 mEq/L (ref 3.7–5.3)
Sodium: 142 mEq/L (ref 137–147)

## 2013-05-29 ENCOUNTER — Other Ambulatory Visit: Payer: Self-pay | Admitting: Cardiothoracic Surgery

## 2013-05-29 DIAGNOSIS — I4891 Unspecified atrial fibrillation: Secondary | ICD-10-CM

## 2013-05-30 ENCOUNTER — Ambulatory Visit (INDEPENDENT_AMBULATORY_CARE_PROVIDER_SITE_OTHER): Payer: Medicare PPO | Admitting: Cardiothoracic Surgery

## 2013-05-30 ENCOUNTER — Other Ambulatory Visit: Payer: Self-pay | Admitting: Cardiothoracic Surgery

## 2013-05-30 ENCOUNTER — Encounter: Payer: Self-pay | Admitting: Cardiothoracic Surgery

## 2013-05-30 ENCOUNTER — Ambulatory Visit
Admission: RE | Admit: 2013-05-30 | Discharge: 2013-05-30 | Disposition: A | Discharge status: Home / Self Care | Payer: Medicare PPO | Source: Ambulatory Visit | Attending: Cardiothoracic Surgery | Admitting: Cardiothoracic Surgery

## 2013-05-30 VITALS — BP 136/82 | HR 62 | Resp 20 | Ht 69.0 in | Wt 186.0 lb

## 2013-05-30 DIAGNOSIS — Z8679 Personal history of other diseases of the circulatory system: Secondary | ICD-10-CM

## 2013-05-30 DIAGNOSIS — J90 Pleural effusion, not elsewhere classified: Secondary | ICD-10-CM

## 2013-05-30 DIAGNOSIS — Z9889 Other specified postprocedural states: Principal | ICD-10-CM

## 2013-05-30 DIAGNOSIS — I4891 Unspecified atrial fibrillation: Secondary | ICD-10-CM

## 2013-05-30 DIAGNOSIS — Z09 Encounter for follow-up examination after completed treatment for conditions other than malignant neoplasm: Secondary | ICD-10-CM
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
## 2013-06-20 ENCOUNTER — Telehealth: Payer: Self-pay | Admitting: *Deleted

## 2013-06-20 NOTE — Telephone Encounter (Signed)
Patient calling on records/ orders to get into rehab. Please call patient

## 2013-06-20 NOTE — Telephone Encounter (Signed)
Spoke w/ pt.  Advised him that completed paperwork had been faxed to cardiac rehab.

## 2013-06-20 NOTE — Telephone Encounter (Signed)
Spoke with patient and he doesn't want to schedule a carotid doppler at this time (1 yr f/u). Will schedule at a later date.

## 2013-06-26 ENCOUNTER — Encounter: Payer: Self-pay | Admitting: Cardiovascular Disease

## 2013-07-18 ENCOUNTER — Encounter: Payer: Self-pay | Admitting: Cardiovascular Disease

## 2013-08-03 ENCOUNTER — Telehealth: Payer: Self-pay | Admitting: *Deleted

## 2013-08-03 NOTE — Telephone Encounter (Signed)
Lmom to call our office. Patient needs a carotid u/s before she see Dr. Fletcher Anon on 08/31/13

## 2013-08-07 ENCOUNTER — Encounter: Payer: Self-pay | Admitting: Vascular Surgery

## 2013-08-08 ENCOUNTER — Ambulatory Visit (HOSPITAL_COMMUNITY)
Admission: RE | Admit: 2013-08-08 | Discharge: 2013-08-08 | Disposition: A | Discharge status: Home / Self Care | Payer: Medicare PPO | Source: Ambulatory Visit | Attending: Family | Admitting: Family

## 2013-08-08 ENCOUNTER — Encounter: Payer: Self-pay | Admitting: Family

## 2013-08-08 ENCOUNTER — Ambulatory Visit (INDEPENDENT_AMBULATORY_CARE_PROVIDER_SITE_OTHER): Payer: Medicare PPO | Admitting: Family

## 2013-08-08 VITALS — BP 164/94 | HR 55 | Resp 16 | Ht 70.0 in | Wt 189.0 lb

## 2013-08-08 DIAGNOSIS — I714 Abdominal aortic aneurysm, without rupture, unspecified: Secondary | ICD-10-CM

## 2013-08-08 DIAGNOSIS — Z48812 Encounter for surgical aftercare following surgery on the circulatory system: Secondary | ICD-10-CM | POA: Insufficient documentation
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
## 2013-08-08 NOTE — Patient Instructions (Signed)
Abdominal Aortic Aneurysm An aneurysm is a weakened or damaged part of an artery wall that bulges from the normal force of blood pumping through the body. An abdominal aortic aneurysm is an aneurysm that occurs in the lower part of the aorta, the main artery of the body.  The major concern with an abdominal aortic aneurysm is that it can enlarge and burst (rupture) or blood can flow between the layers of the wall of the aorta through a tear (aorticdissection). Both of these conditions can cause bleeding inside the body and can be life threatening unless diagnosed and treated promptly. CAUSES  The exact cause of an abdominal aortic aneurysm is unknown. Some contributing factors are:   A hardening of the arteries caused by the buildup of fat and other substances in the lining of a blood vessel (arteriosclerosis).  Inflammation of the walls of an artery (arteritis).   Connective tissue diseases, such as Marfan syndrome.   Abdominal trauma.   An infection, such as syphilis or staphylococcus, in the wall of the aorta (infectious aortitis) caused by bacteria. RISK FACTORS  Risk factors that contribute to an abdominal aortic aneurysm may include:  Age older than 60 years.   High blood pressure (hypertension).  Male gender.  Ethnicity (white race).  Obesity.  Family history of aneurysm (first degree relatives only).  Tobacco use. PREVENTION  The following healthy lifestyle habits may help decrease your risk of abdominal aortic aneurysm:  Quitting smoking. Smoking can raise your blood pressure and cause arteriosclerosis.  Limiting or avoiding alcohol.  Keeping your blood pressure, blood sugar level, and cholesterol levels within normal limits.  Decreasing your salt intake. In somepeople, too much salt can raise blood pressure and increase your risk of abdominal aortic aneurysm.  Eating a diet low in saturated fats and cholesterol.  Increasing your fiber intake by including  whole grains, vegetables, and fruits in your diet. Eating these foods may help lower blood pressure.  Maintaining a healthy weight.  Staying physically active and exercising regularly. SYMPTOMS  The symptoms of abdominal aortic aneurysm may vary depending on the size and rate of growth of the aneurysm.Most grow slowly and do not have any symptoms. When symptoms do occur, they may include:  Pain (abdomen, side, lower back, or groin). The pain may vary in intensity. A sudden onset of severe pain may indicate that the aneurysm has ruptured.  Feeling full after eating only small amounts of food.  Nausea or vomiting or both.  Feeling a pulsating lump in the abdomen.  Feeling faint or passing out. DIAGNOSIS  Since most unruptured abdominal aortic aneurysms have no symptoms, they are often discovered during diagnostic exams for other conditions. An aneurysm may be found during the following procedures:  Ultrasonography (A one-time screening for abdominal aortic aneurysm by ultrasonography is also recommended for all men aged 65-75 years who have ever smoked).  X-ray exams.  A computed tomography (CT).  Magnetic resonance imaging (MRI).  Angiography or arteriography. TREATMENT  Treatment of an abdominal aortic aneurysm depends on the size of your aneurysm, your age, and risk factors for rupture. Medication to control blood pressure and pain may be used to manage aneurysms smaller than 6 cm. Regular monitoring for enlargement may be recommended by your caregiver if:  The aneurysm is 3-4 cm in size (an annual ultrasonography may be recommended).  The aneurysm is 4-4.5 cm in size (an ultrasonography every 6 months may be recommended).  The aneurysm is larger than 4.5 cm in   size (your caregiver may ask that you be examined by a vascular surgeon). If your aneurysm is larger than 6 cm, surgical repair may be recommended. There are two main methods for repair of an aneurysm:   Endovascular  repair (a minimally invasive surgery). This is done most often.  Open repair. This method is used if an endovascular repair is not possible. Document Released: 10/14/2004 Document Revised: 05/01/2012 Document Reviewed: 02/04/2012 ExitCare Patient Information 2015 [employer]. This information is not intended to replace advice given to you by your health care provider. Make sure you discuss any questions you have with your health care provider.  

## 2013-08-18 ENCOUNTER — Encounter: Payer: Self-pay | Admitting: Cardiovascular Disease

## 2013-08-22 IMAGING — CT CT CTA ABD/PEL W/CM AND/OR W/O CM
1 of 9 series · 10 of 46 positions shown, 16 images · IV contrast (80CC OMNI 350)
Comparison: 07/28/2011

CLINICAL DATA: Status [place] procedure on 06/22/2011.

CT ANGIOGRAPHY ABDOMEN AND PELVIS
TECHNIQUE: Multidetector CT imaging of the abdomen and pelvis was
performed using the standard protocol during bolus administration
of intravenous contrast.  Multiplanar reconstructed images
including MIPs were obtained and reviewed to evaluate the vascular
anatomy.
Contrast: 80mL OMNIPAQUE IOHEXOL 350 MG/ML SOLN

[Series 4: angio · axial · 0.78mm/px · z∈[-366,-28]mm · 10 of 231 slices shown, 16 images]
[im 21/231  soft-tissue]
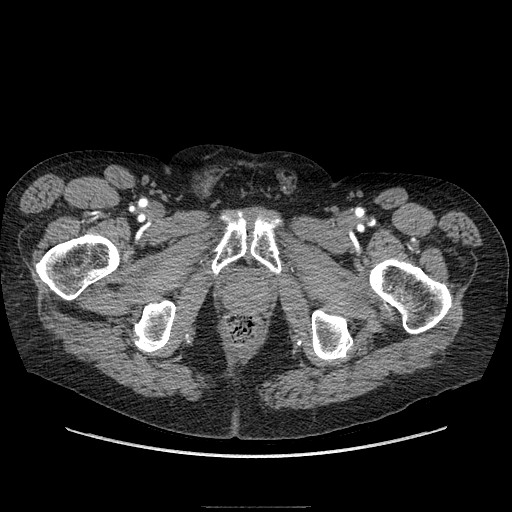
[im 21/231  bone]
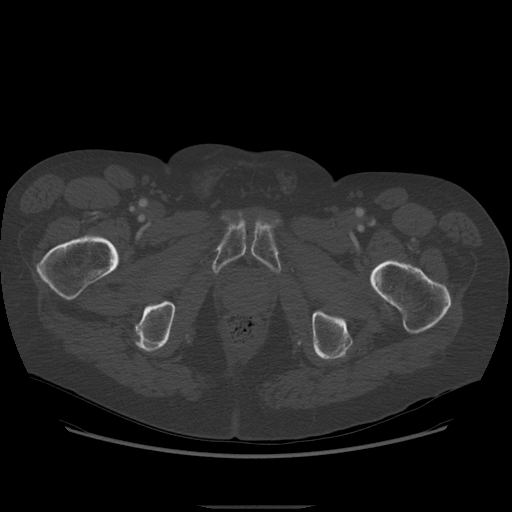
[im 42/231  soft-tissue]
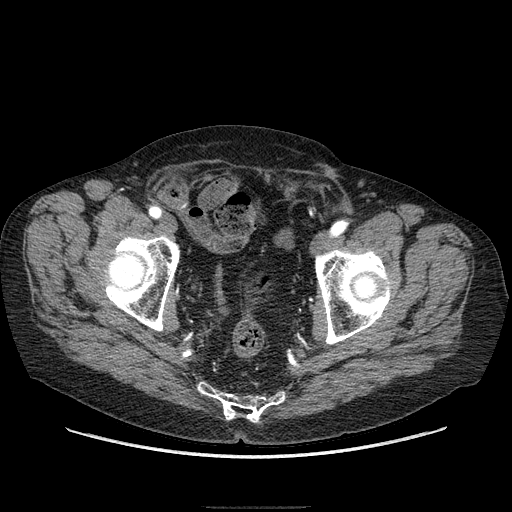
[im 63/231  soft-tissue]
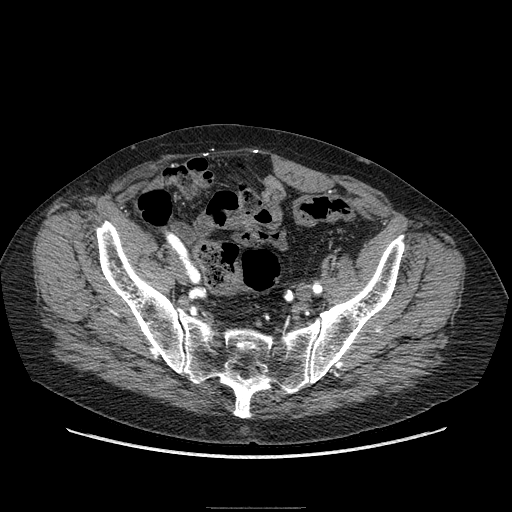
[im 84/231  soft-tissue]
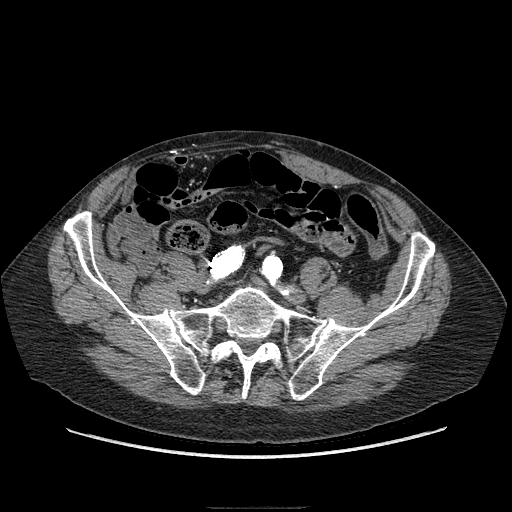
[im 105/231  soft-tissue]
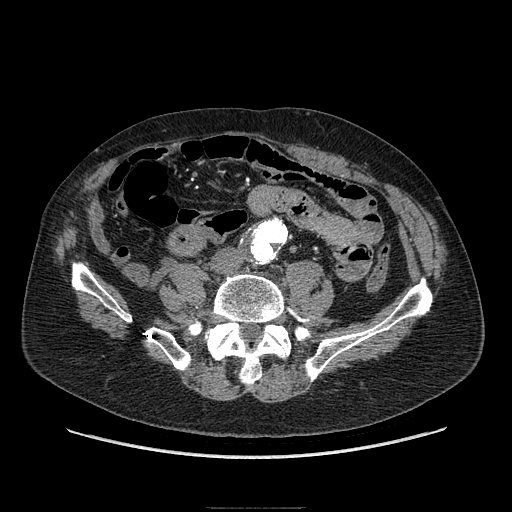
[im 126/231  soft-tissue]
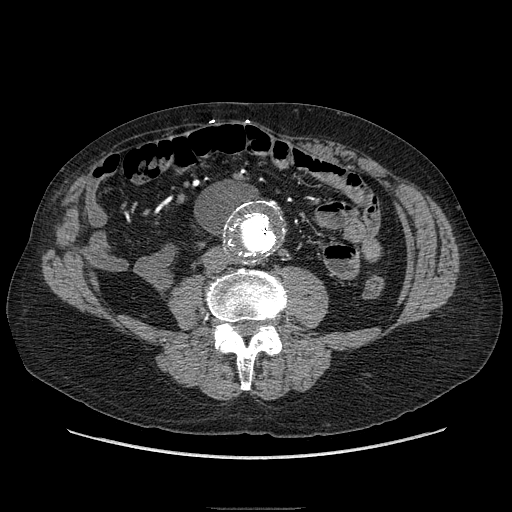
[im 147/231  soft-tissue]
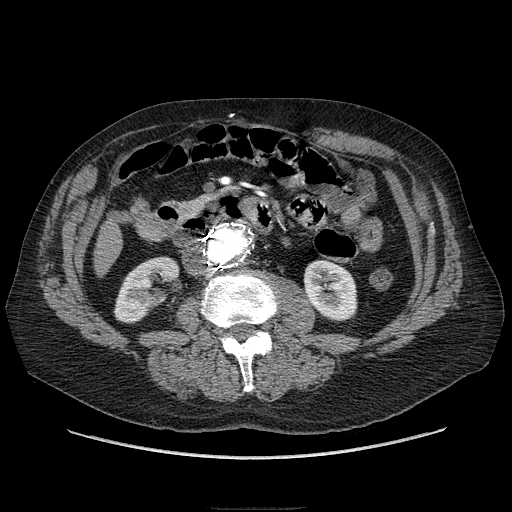
[im 147/231  lung]
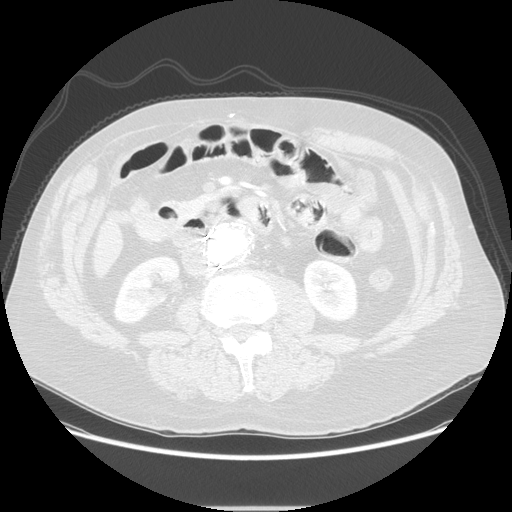
[im 168/231  soft-tissue]
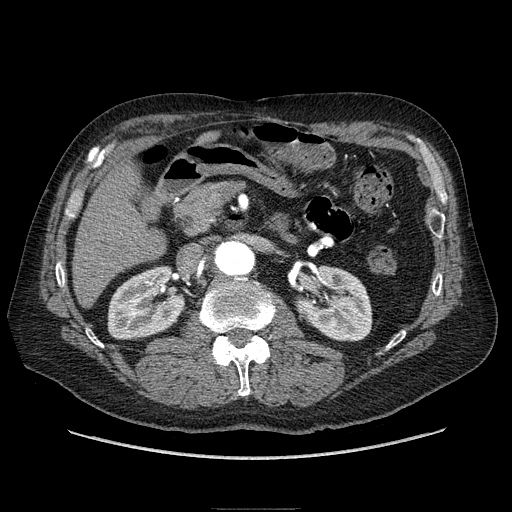
[im 168/231  lung]
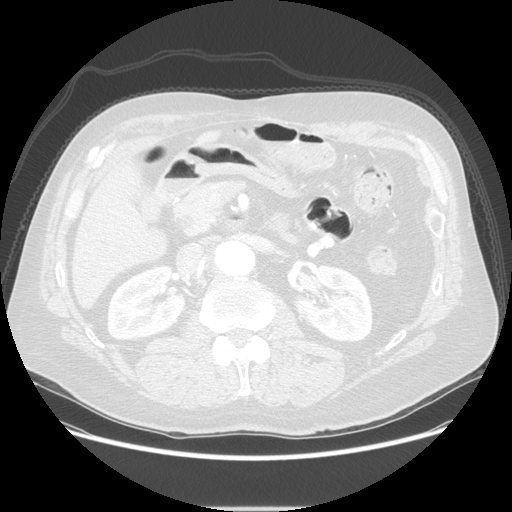
[im 189/231  soft-tissue]
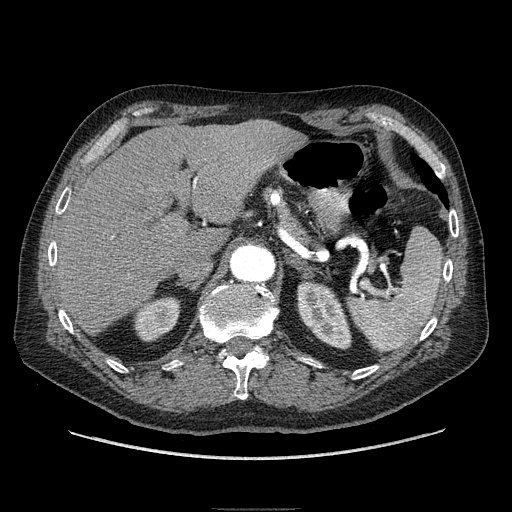
[im 189/231  lung]
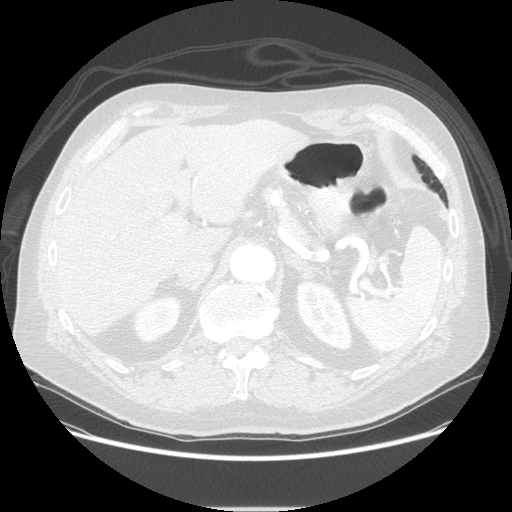
[im 189/231  bone]
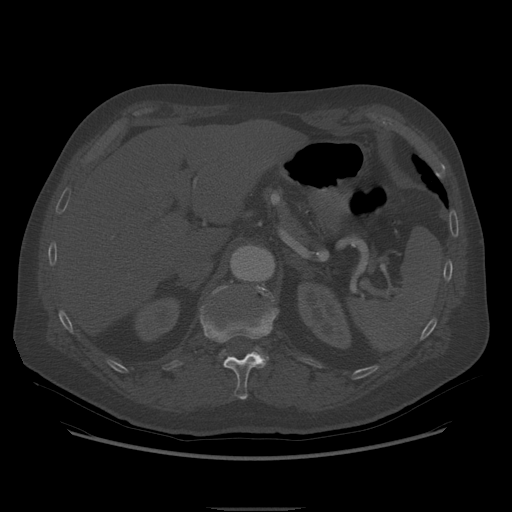
[im 210/231  soft-tissue]
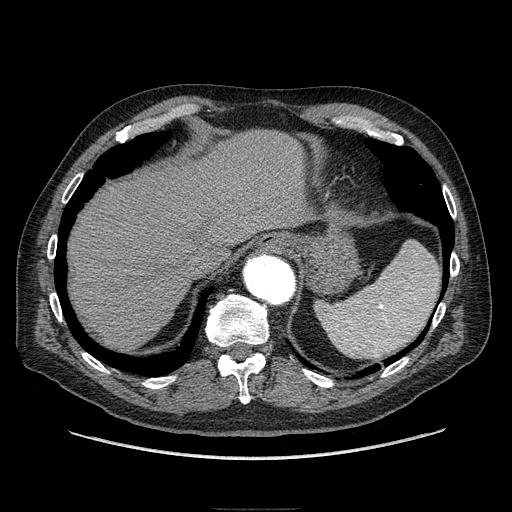
[im 210/231  lung]
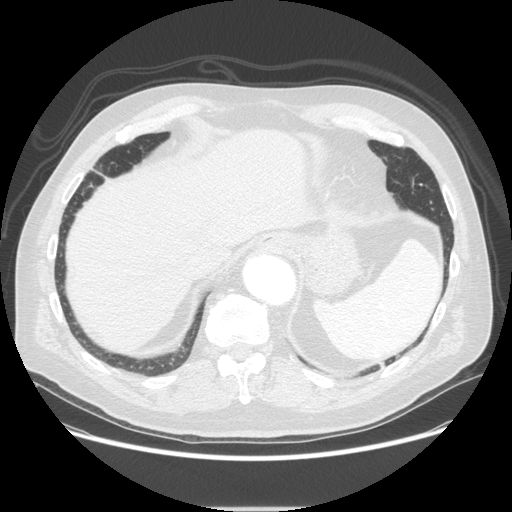

[10 of 46 positions shown; findings below may reference images not displayed]

FINDINGS: Patency of the abdominal aortic endograft is stable.
The main body of the endograft shows slight caudal slip since the
prior CTA but continues to demonstrate adequate proximal seal
without evidence of a type 1 endoleak.  A mild amount of
circumferential mural thrombus remains in the main body of the
endograft which is relatively stable but slightly increased along
the top left margin of the proximal endograft where a more
prominent crescent of mural thrombus is identified on axial and
reformatted imaging.  No mural thrombus extends into either iliac
limb of the endograft.

The aneurysm sac shows thrombosis and retraction in size since the
prior study, currently measuring 4.0 x 5.0 cm in greatest
transverse diameter just below the inferior mesenteric artery
origin. There is no evidence of type 2 endoleak.  The inferior
mesenteric artery shows reconstitution by collaterals and
retrograde opacification of the trunk right up to the margin of the
aortic sac but without obvious perfusion of the aortic sac.

There is stable and normal patency of a common celiacomesenteric
trunk.  Two separate right renal arteries and a single left renal
artery show stable and normal patency.

Native iliac arteries and common femoral arteries show stable and
normal patency.  There is a thoracoabdominal component of
aneurysmal disease superior to the endograft.  The lower descending
thoracic aorta measures 3.8 cm in maximal diameter and may have
increased slightly in caliber from approximately 3.5 cm previously.
The aorta at the diaphragmatic hiatus is stable and measures
approximately 3.5 cm.  The immediate supraceliac abdominal aorta
measures 3.4 - 3.5 cm in greatest diameter and is stable to
marginally increased in caliber.

Nonvascular evaluation shows a previously not visualized right
inguinal hernia containing a loop of distal small bowel.  This area
does not appear to be causing any bowel obstruction or inflammation
of the herniated bowel.  Stable large inferior duodenal
diverticulum contains fluid.

 Review of the MIP images confirms the above findings.
IMPRESSION: 1.  CT demonstrates slight caudal migration of the main body of the
aortic endograft with associated slightly increased prominence of
mural thrombus along the left superior end of the endograft.  This
is not associated with a type 1 endoleak or aortic enlargement.
2.  Decrease in size of thrombosed aneurysm sac with maximal
diameter of 5 cm currently.  No type 2 endoleak is demonstrated.
3. Aneurysmal disease of the thoracoabdominal aorta above the level
of the endograft repair.  Measurements made in various segments
show either stable diameter or slightly increased diameter of the
distal thoracic aorta and proximal abdominal aorta.
4.  Right inguinal hernia containing a loop of distal small bowel.
This is not currently associated with any signs of incarceration or
strangulation.
5.  Stable large duodenal diverticulum containing fluid.

## 2013-08-30 ENCOUNTER — Ambulatory Visit: Payer: Medicare PPO | Admitting: Cardiovascular Disease

## 2013-08-31 ENCOUNTER — Ambulatory Visit (INDEPENDENT_AMBULATORY_CARE_PROVIDER_SITE_OTHER): Payer: Medicare PPO | Admitting: Cardiovascular Disease

## 2013-08-31 ENCOUNTER — Encounter: Payer: Self-pay | Admitting: Cardiovascular Disease

## 2013-08-31 VITALS — BP 140/80 | HR 52 | Ht 70.0 in | Wt 191.0 lb

## 2013-08-31 DIAGNOSIS — F17200 Nicotine dependence, unspecified, uncomplicated: Secondary | ICD-10-CM

## 2013-08-31 DIAGNOSIS — I739 Peripheral vascular disease, unspecified: Secondary | ICD-10-CM

## 2013-08-31 DIAGNOSIS — I4891 Unspecified atrial fibrillation: Secondary | ICD-10-CM

## 2013-08-31 DIAGNOSIS — E785 Hyperlipidemia, unspecified: Secondary | ICD-10-CM

## 2013-08-31 DIAGNOSIS — Z952 Presence of prosthetic heart valve: Secondary | ICD-10-CM

## 2013-08-31 DIAGNOSIS — Z954 Presence of other heart-valve replacement: Secondary | ICD-10-CM

## 2013-08-31 IMAGING — CT CT ABD-PELV W/ CM
1 of 2 series · 15 of 32 positions shown, 19 images · IV contrast (isovue)
Comparison: none

REASON FOR EXAM: (1) pain at hernia site, rlq; (2) pain at hernia site rlq
COMMENTS:

PROCEDURE:     CT  - CT ABDOMEN / PELVIS  W  - February 11, 2012  [DATE]
RESULT:     Comparison:  None
TECHNIQUE: Multiple axial images of the abdomen and pelvis were performed
from the lung bases to the pubic symphysis, with p.o. contrast and with 85
mL of Isovue 300 intravenous contrast.

[Series 2: 3mm soft tissue · axial · 0.72mm/px · z∈[-578,-160]mm · 15 of 153 slices shown, 19 images]
[im 7/153  soft-tissue]
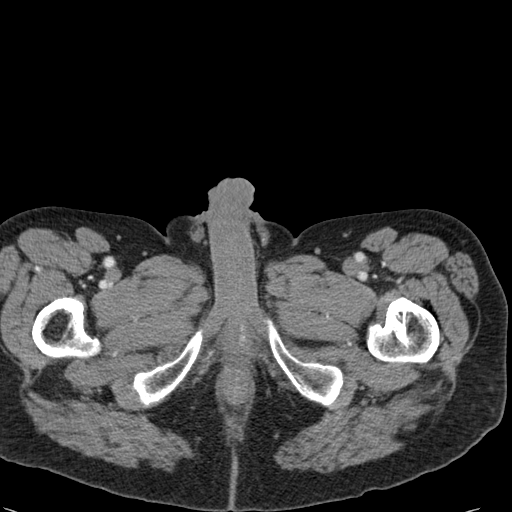
[im 7/153  bone]
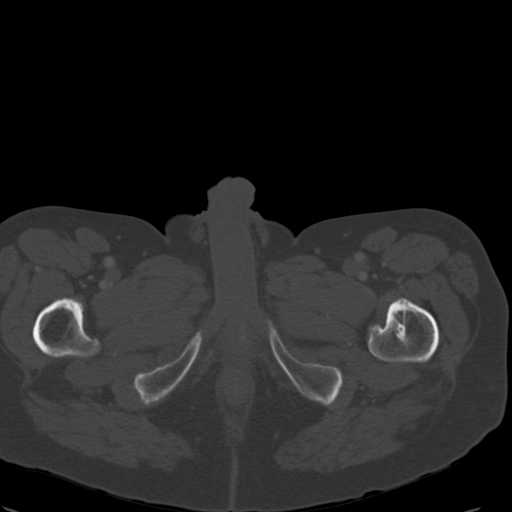
[im 19/153  soft-tissue]
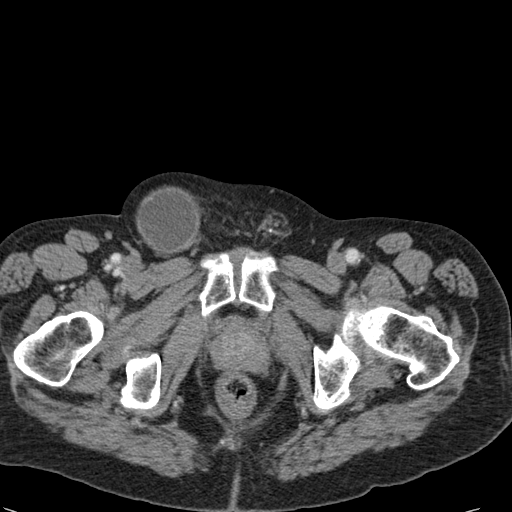
[im 31/153  soft-tissue]
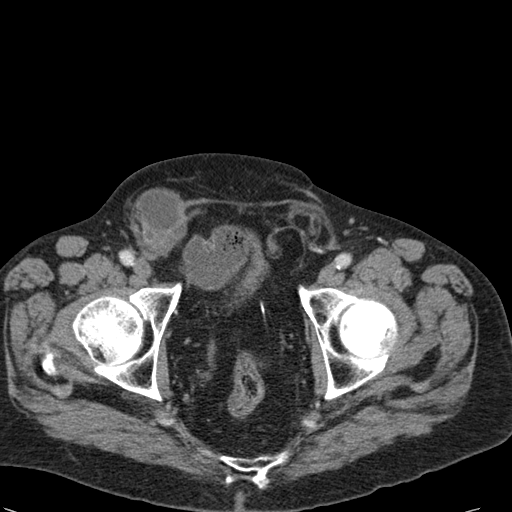
[im 43/153  soft-tissue]
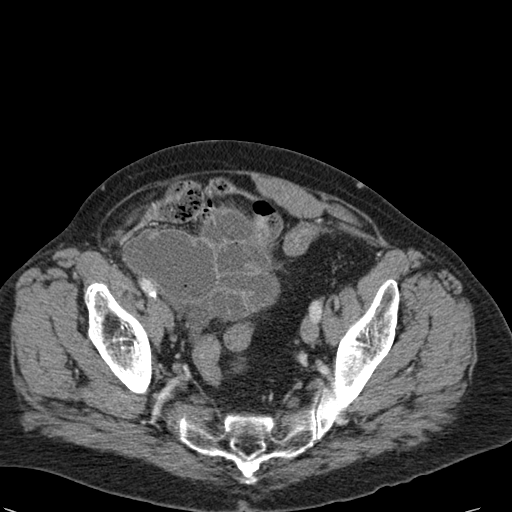
[im 55/153  soft-tissue]
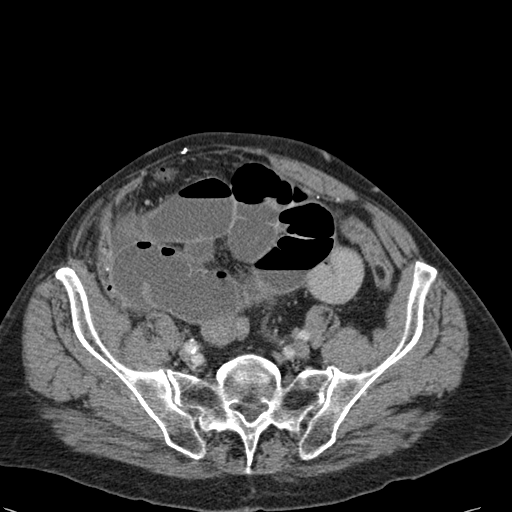
[im 67/153  soft-tissue]
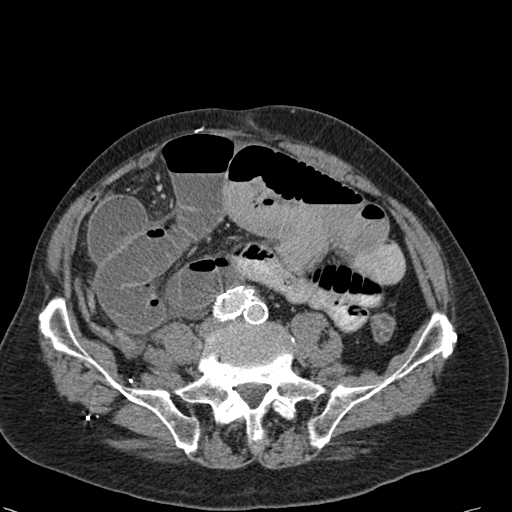
[im 80/153  soft-tissue]
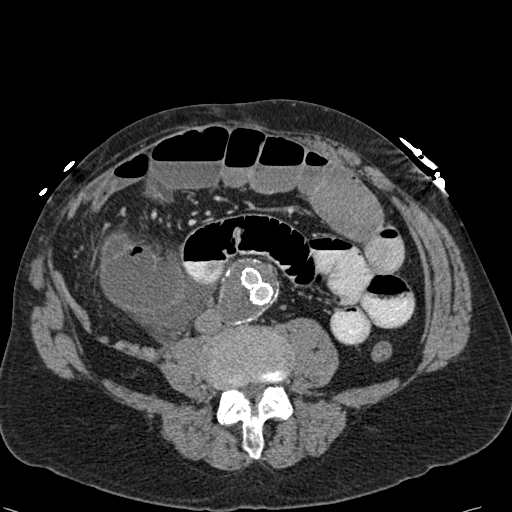
[im 86/153  soft-tissue]
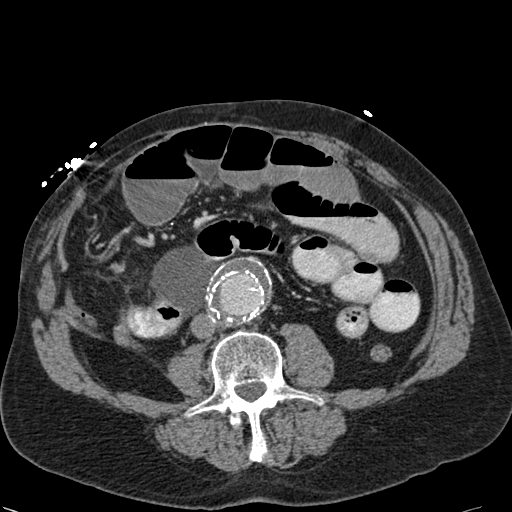
[im 98/153  soft-tissue]
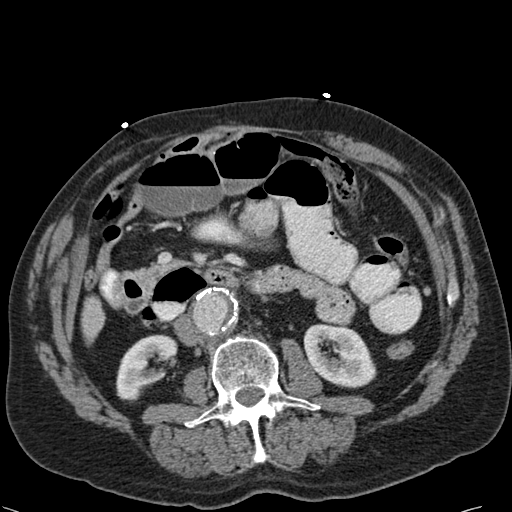
[im 98/153  bone]
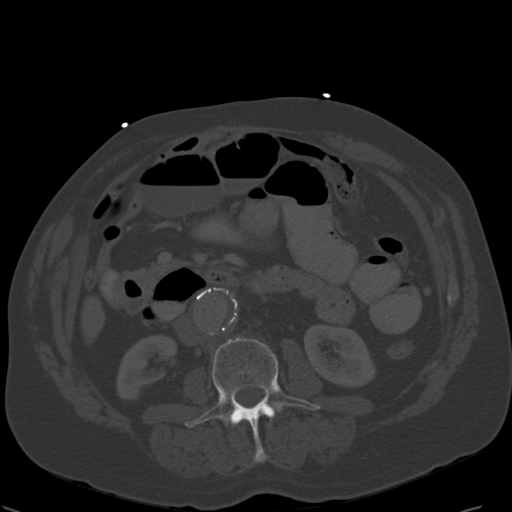
[im 110/153  soft-tissue]
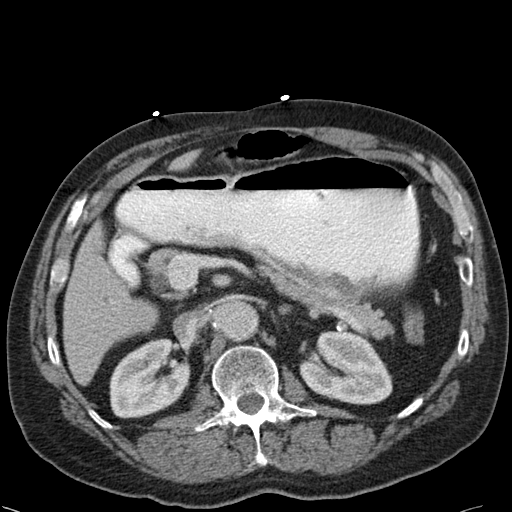
[im 122/153  soft-tissue]
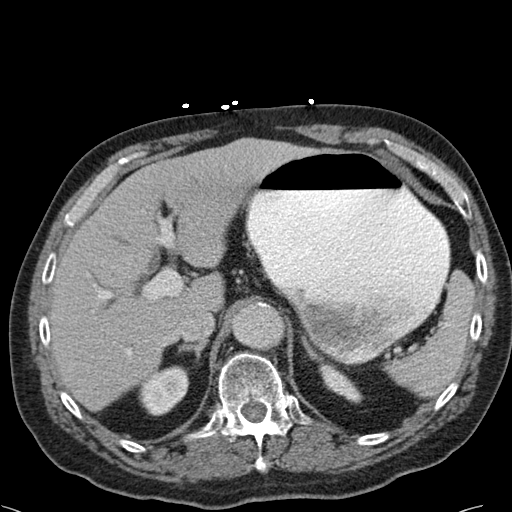
[im 128/153  lung]
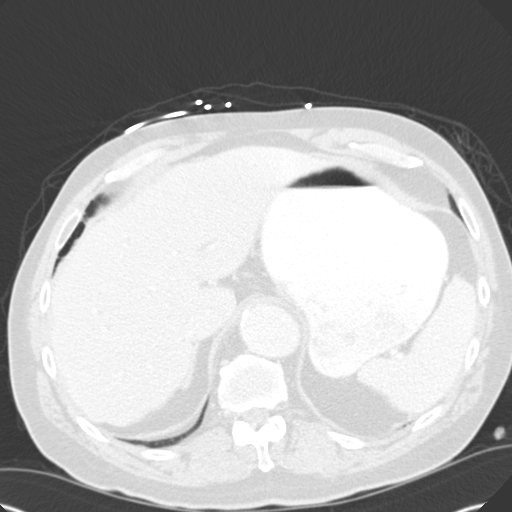
[im 134/153  soft-tissue]
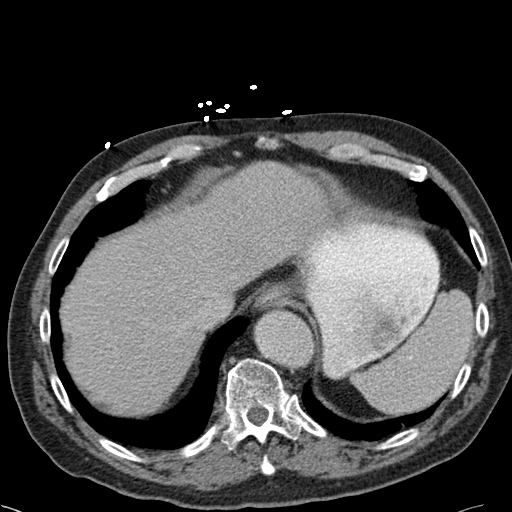
[im 134/153  lung]
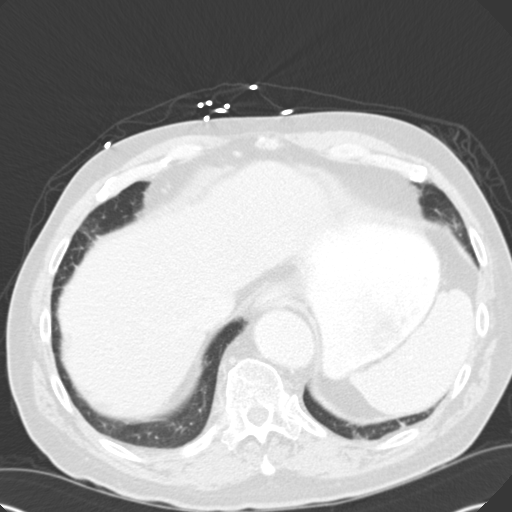
[im 140/153  lung]
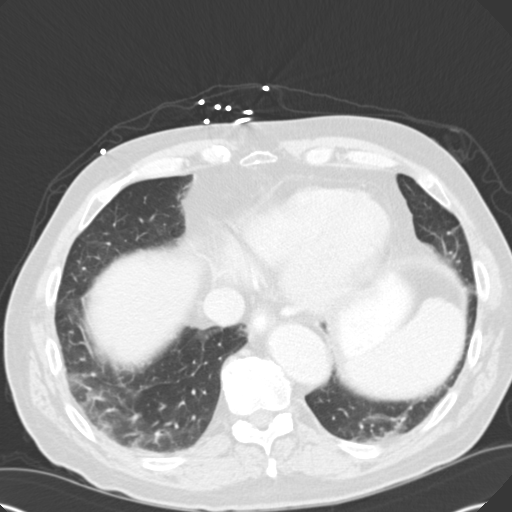
[im 146/153  soft-tissue]
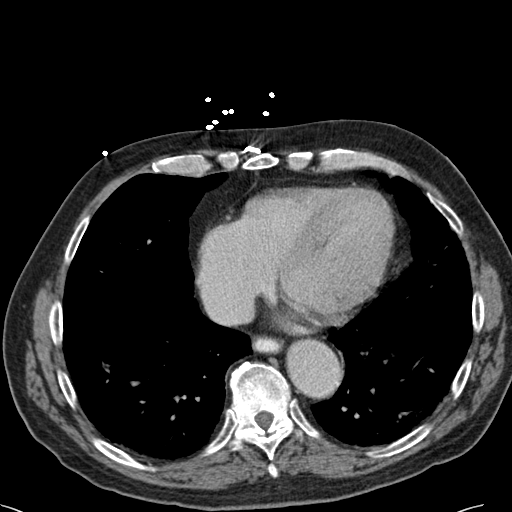
[im 146/153  lung]
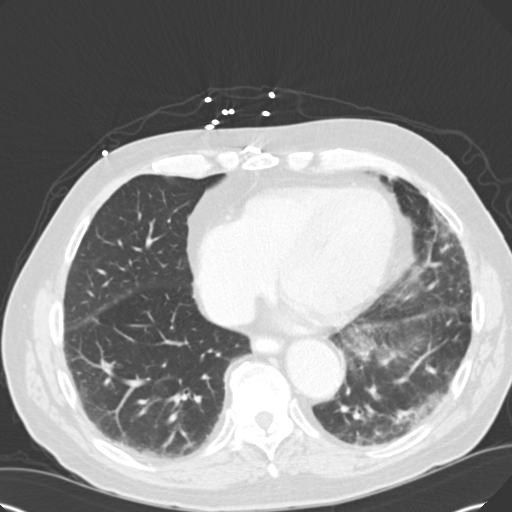

[15 of 32 positions shown; findings below may reference images not displayed]

FINDINGS: Mild basilar opacities are likely secondary to atelectasis.

Mild prominence of the central intrahepatic biliary ducts is likely related
to prior cholecystectomy. Mild dilatation of the common bile duct is also
likely related to prior cholecystectomy. Punctate calcification in the
spleen are likely related to old prior infection. There is mild nodular
thickening of the left adrenal gland, without definite discrete mass. There
is a 2 mm calculus in the inferior pole of the left kidney. No
hydronephrosis. There is a 4 mm calculus in the interpolar region of the
right kidney.

There is an aortic biiliac stent. There are multiple loops of dilated small
bowel. There is a right inguinal hernia containing a loop of dilated fluid-
small bowel. The bowel exiting the hernia sac as well as the distal small
bowel are completely decompressed. The colon is completely decompressed.
There is fecalization of the small bowel proximal to the hernia. There is
mild fluid and stranding surrounding several loops of small bowel in the mid
right abdomen, which is nonspecific. No free intraperitoneal air. No portal
venous gas or pneumatosis. The appendix is not identified. If

No aggressive lytic or sclerotic osseous lesions are identified. Small
lucency in the proximal left femur with surrounding sclerotic density likely
represents a benign fibro-osseous lesion.
IMPRESSION: High-grade small bowel obstruction caused by a right inguinal hernia
containing a loop of small bowel.

## 2013-08-31 NOTE — Assessment & Plan Note (Signed)
Denies having claudication type symptoms at this time. Participating in cardiac rehabilitation.

## 2013-08-31 NOTE — Assessment & Plan Note (Signed)
Continues to do well after his valve and root/ascending aorta surgery. Active, completing cardiac rehabilitation

## 2013-08-31 NOTE — Assessment & Plan Note (Signed)
Suggested he have lipids to primary care. Minimal coronary artery disease by prior catheterization. He is not interested in cholesterol medication at this time and in fact stopped his Lipitor on his own in the past

## 2013-08-31 NOTE — Patient Instructions (Signed)
You are doing well. No medication changes were made.  Please call us if you have new issues that need to be addressed before your next appt.  Your physician wants you to follow-up in: 6 months.  You will receive a reminder letter in the mail two months in advance. If you don't receive a letter, please call our office to schedule the follow-up appointment.   

## 2013-08-31 NOTE — Progress Notes (Signed)
Patient ID: Gregory Turner, male    DOB: 06-06-1940, 73 y.o.   MRN: [id number]  HPI Comments: 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 Scot Dock in 2013   status post AVR and ascending aorta aneurysm repair with removal of his prior mechanical aortic valve, placement of tissue valve by Dr. Prescott Gum in [place] end of November 4098 (complication with catheter (Swan-Ganz catheter?)  indwelling now removed  He presents for routine followup  In general he is doing well. He has stopped smoking, he is active, no new complaints. He feels claustrophobic at times, likes to sit outside on his porch. He denies any significant leg edema, coughing, Some mild chronic shortness of breath. He has not been playing golf very much He is doing cardiac rehabilitation and feels better Wife continues to smoke  CT scan March 2015  Prior cardiac Catheterization 11/21/2012 showing right dominant coronary system with mild diffuse atherosclerosis, 30% LAD disease, normal right heart pressures  Previous echocardiogram showed normal LV function, mildly dilated left atrium, normal functioning aortic valve mechanical valve. He has a strong family history of aneurysm, with a brother who had AAA rupture, mother with AAA. Most of the family smokes.  EKG today shows normal sinus rhythm with rate 52 beats per minute, borderline intraventricular conduction delay, nonspecific T wave abnormality He stopped taking Lipitor on his own in the past   Outpatient Encounter Prescriptions as of 08/31/2013  Medication Sig  . acetaminophen (TYLENOL) 500 MG tablet Take 500 mg by mouth every 6 (six) hours as needed. For pain   . albuterol (PROVENTIL HFA;VENTOLIN HFA) 108 (90 BASE) MCG/ACT inhaler Inhale 1 puff into the lungs every 6 (six) hours as needed for wheezing or shortness of breath.  Marland Kitchen amiodarone (PACERONE) 200 MG tablet Take 200 mg by mouth daily.  Marland Kitchen aspirin 81 MG tablet Take one tablet on Wednesday & Saturday.  . budesonide-formoterol (SYMBICORT) 160-4.5 MCG/ACT inhaler Inhale 2 puffs into the lungs 2 (two) times daily.  . metoprolol tartrate (LOPRESSOR) 25 MG tablet Take 12.5 mg by mouth daily.     Review of Systems  Constitutional: Negative.   HENT: Negative.   Eyes: Negative.   Respiratory: Negative.   Cardiovascular: Negative.   Gastrointestinal: Negative.   Endocrine: Negative.   Musculoskeletal: Negative.   Skin: Negative.   Allergic/Immunologic: Negative.   Neurological: Negative.   Hematological: Negative.   Psychiatric/Behavioral: Negative.   All other systems reviewed and are negative.   BP 152/86  Pulse 52  Ht 5\' 10"  (1.778 m)  Wt 191 lb (86.637 kg)  BMI 27.41 kg/m2 Repeat blood pressure 140/80 Physical Exam  Nursing note and vitals reviewed. Constitutional: He is oriented to person, place, and time. He appears well-developed and well-nourished.  HENT:  Head: Normocephalic.  Nose: Nose normal.  Mouth/Throat: Oropharynx is clear and moist.  Eyes: Conjunctivae are normal. Pupils are equal, round, and reactive to light.  Neck: Normal range of motion. Neck supple. No JVD present.  Cardiovascular: Normal rate, regular rhythm, S1 normal, S2 normal and intact distal pulses.  Exam reveals no gallop and no friction rub.   Murmur heard.  Crescendo systolic murmur is present with a grade of 2/6  Audible  click from his prosthetic valve  Pulmonary/Chest: Effort normal and breath sounds normal. No respiratory distress. He has no wheezes. He has no rales. He exhibits no tenderness.  Abdominal: Soft. Bowel sounds are normal. He exhibits no distension. There is no tenderness.   Musculoskeletal: Normal range of motion. He exhibits no edema and no tenderness.  Lymphadenopathy:    He has no cervical adenopathy.  Neurological: He is alert and oriented to person, place, and time. Coordination normal.  Skin: Skin is warm and dry. No rash noted. No erythema.  Psychiatric: He has a normal mood and affect. His behavior is normal. Judgment and thought content normal.      Assessment and Plan

## 2013-08-31 NOTE — Assessment & Plan Note (Addendum)
Complemented him on his smoking cessation

## 2013-08-31 NOTE — Assessment & Plan Note (Signed)
Maintaining normal sinus rhythm on low-dose metoprolol with amiodarone

## 2013-09-01 IMAGING — CR DG ABDOMEN 2V
1 series · 3 of 3 positions shown · non-contrast
Comparison: none

REASON FOR EXAM: sbo
COMMENTS:

[Series 1: w abdomen upright · 0.14mm/px · 3 of 3 slices shown]
[im 1/3]
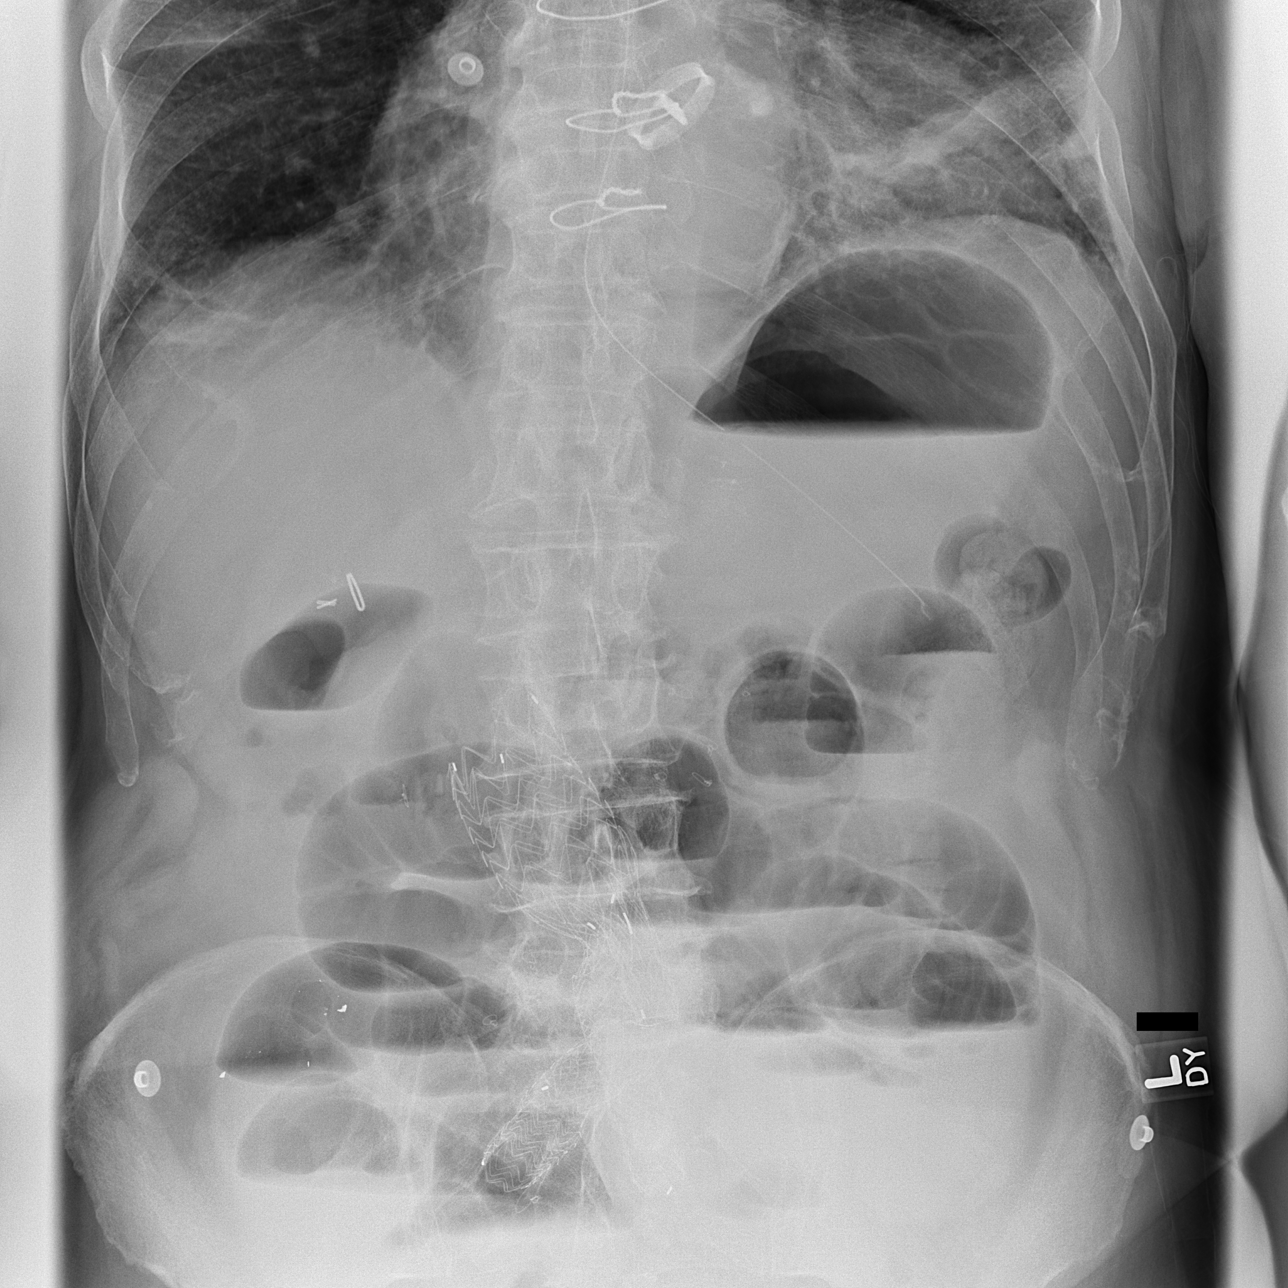
[im 2/3]
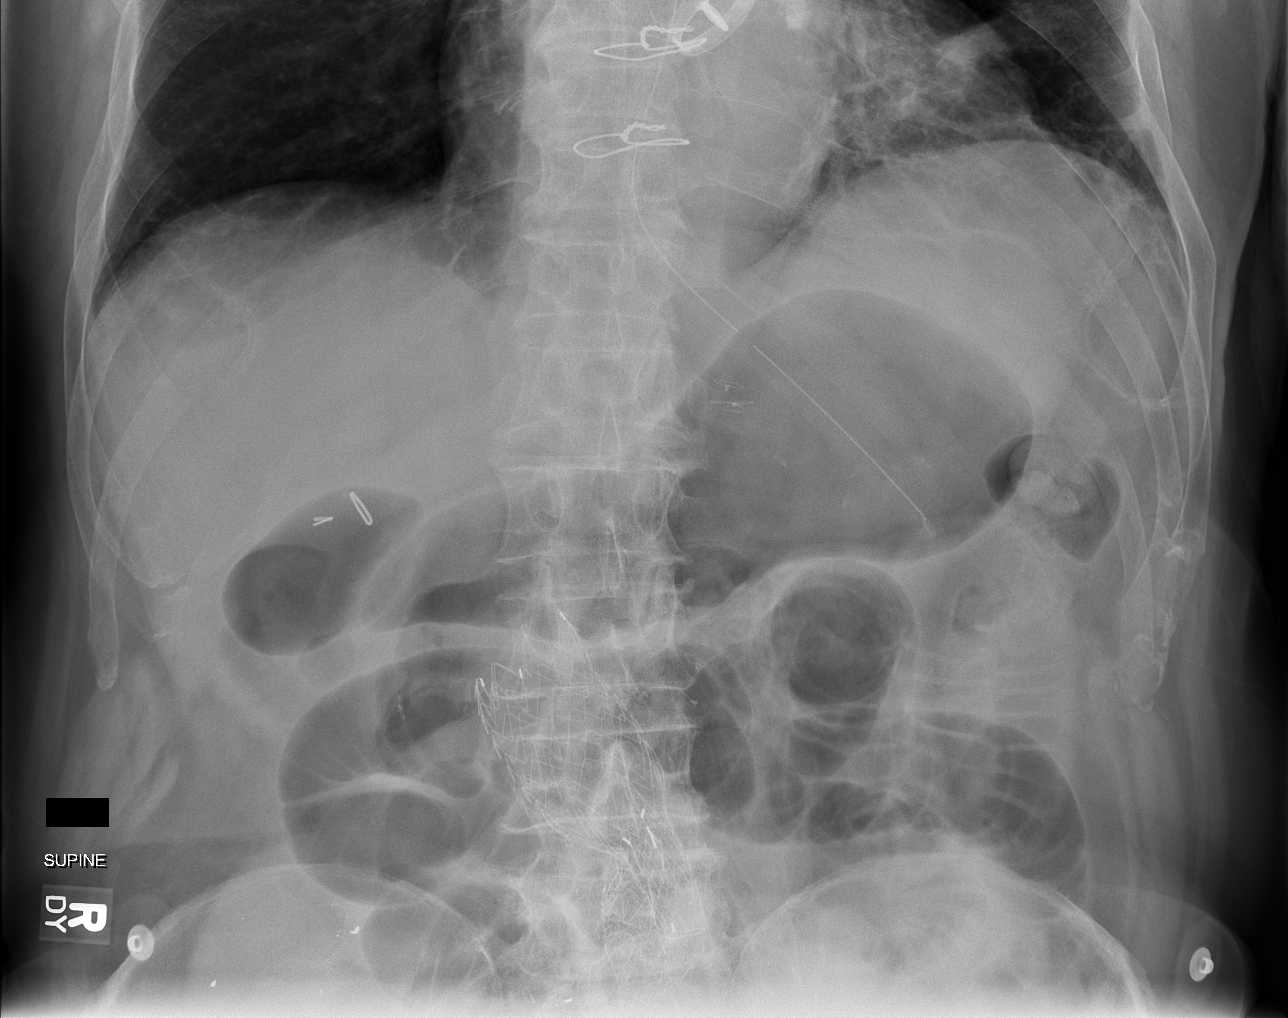
[im 3/3]
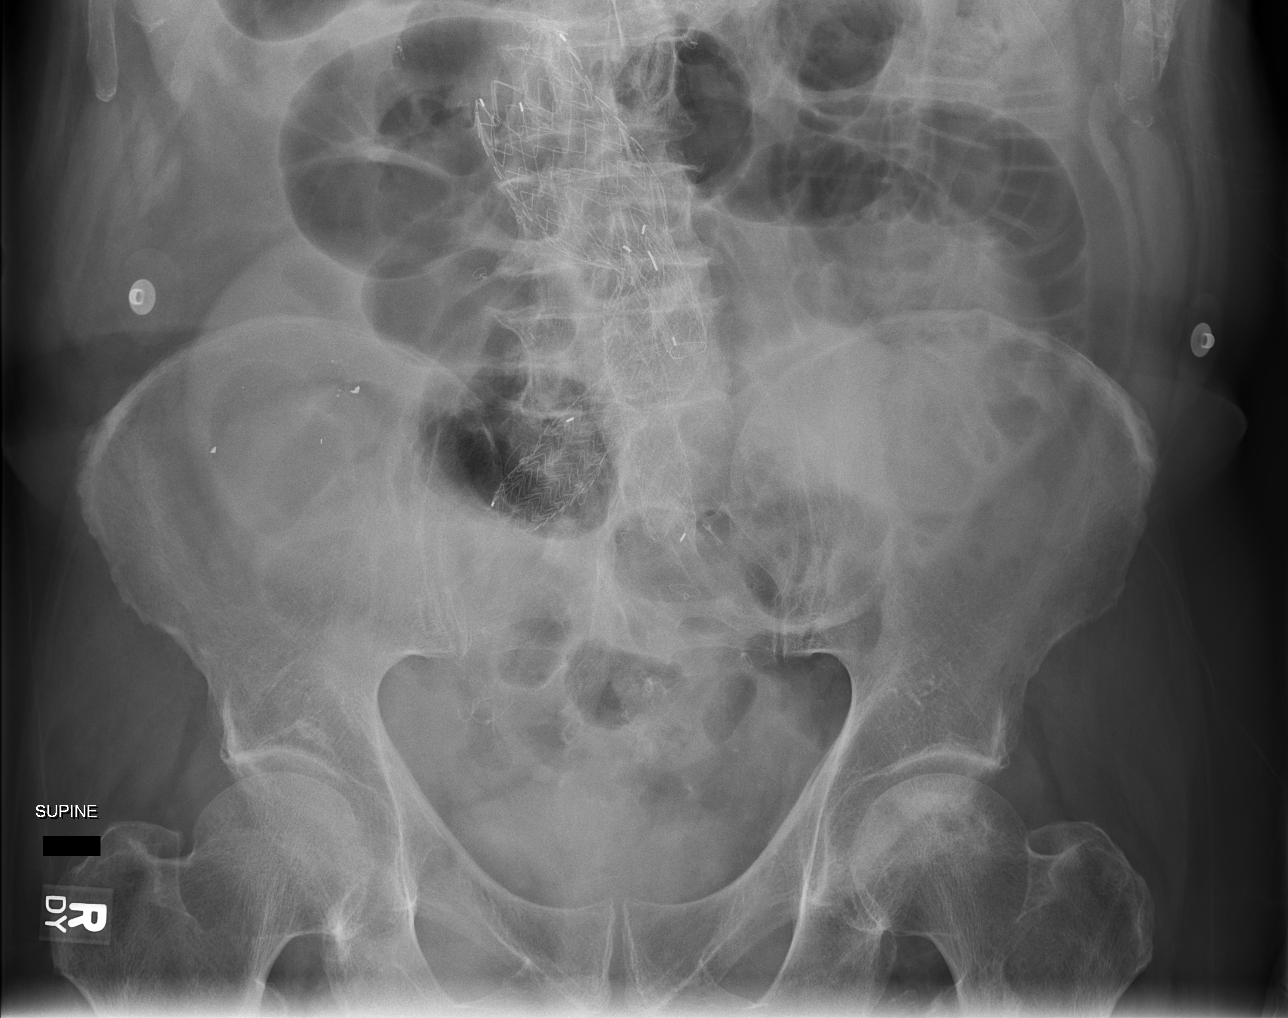

[3 of 3 positions shown; findings below may reference images not displayed]

PROCEDURE:     DXR - DXR ABDOMEN 2 V FLAT AND ERECT  - February 12, 2012  [DATE]

RESULT:     A nasogastric tube is present with the tip in the body the
stomach. There is an air-fluid level in the stomach. Distended loops of
small bowel are present with multiple air-fluid levels. Cholecystectomy
clips are present. There is relative clearing of air from the colon without
significant residual fecal material. Aortoiliac stent graft are present.
Degenerative changes are seen in the hips and spine. There is some
atelectasis or infiltrate at the left lung base.
IMPRESSION: Changes suggestive of small bowel obstruction. No evidence
of perforation.

[REDACTED]

## 2013-09-02 IMAGING — CR DG ABDOMEN 2V
1 series · 3 of 3 positions shown · non-contrast
Comparison: none

REASON FOR EXAM: sbo
COMMENTS:

[Series 4: t abdomen supine · 0.14mm/px · 3 of 3 slices shown]
[im 1/3]
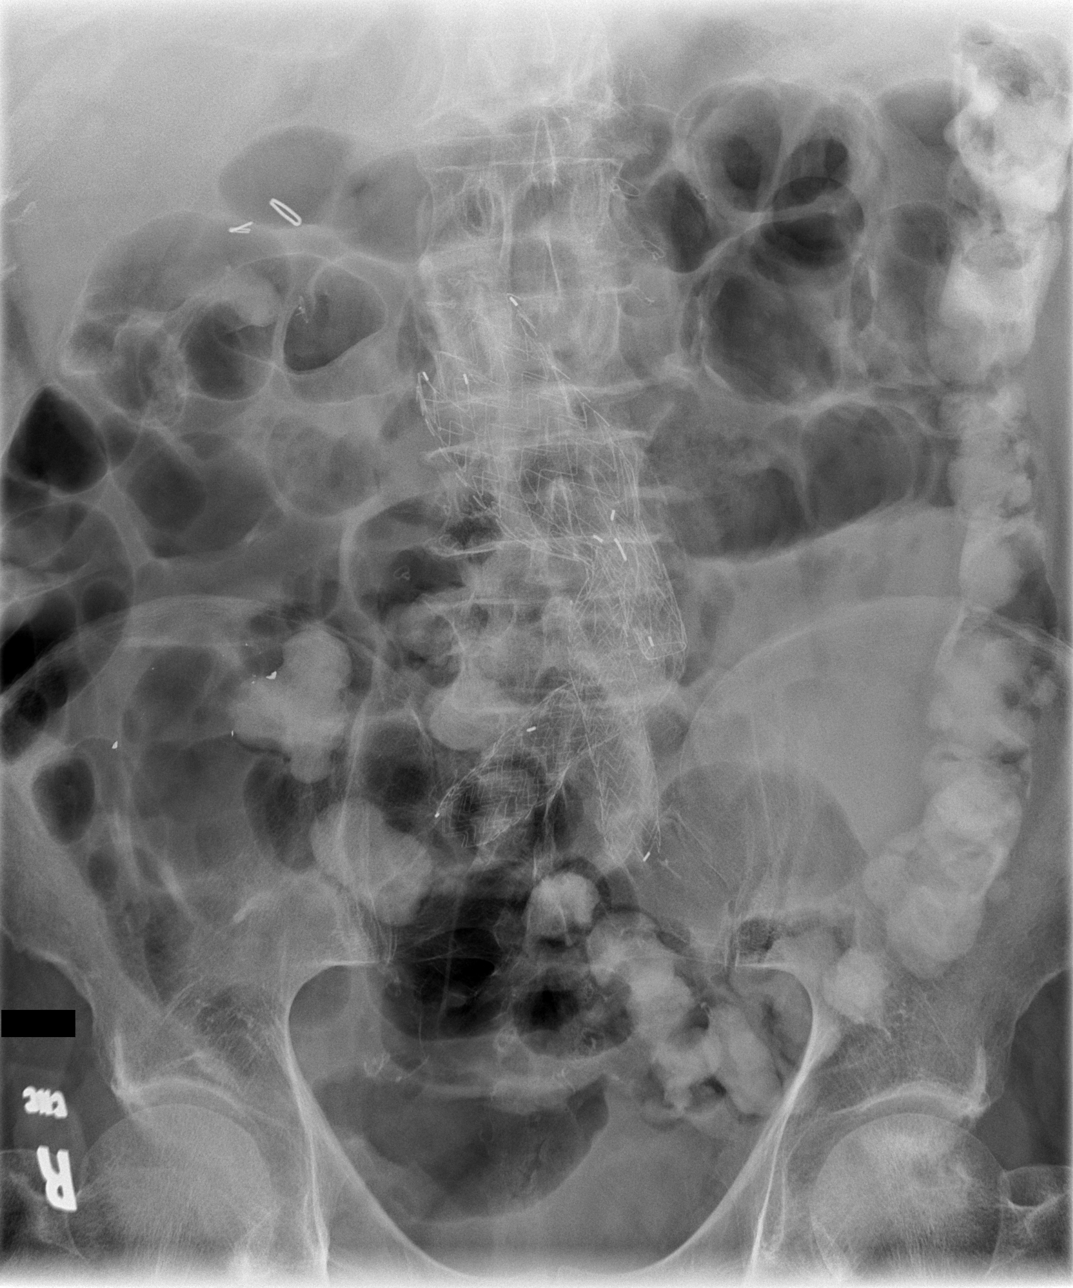
[im 2/3]
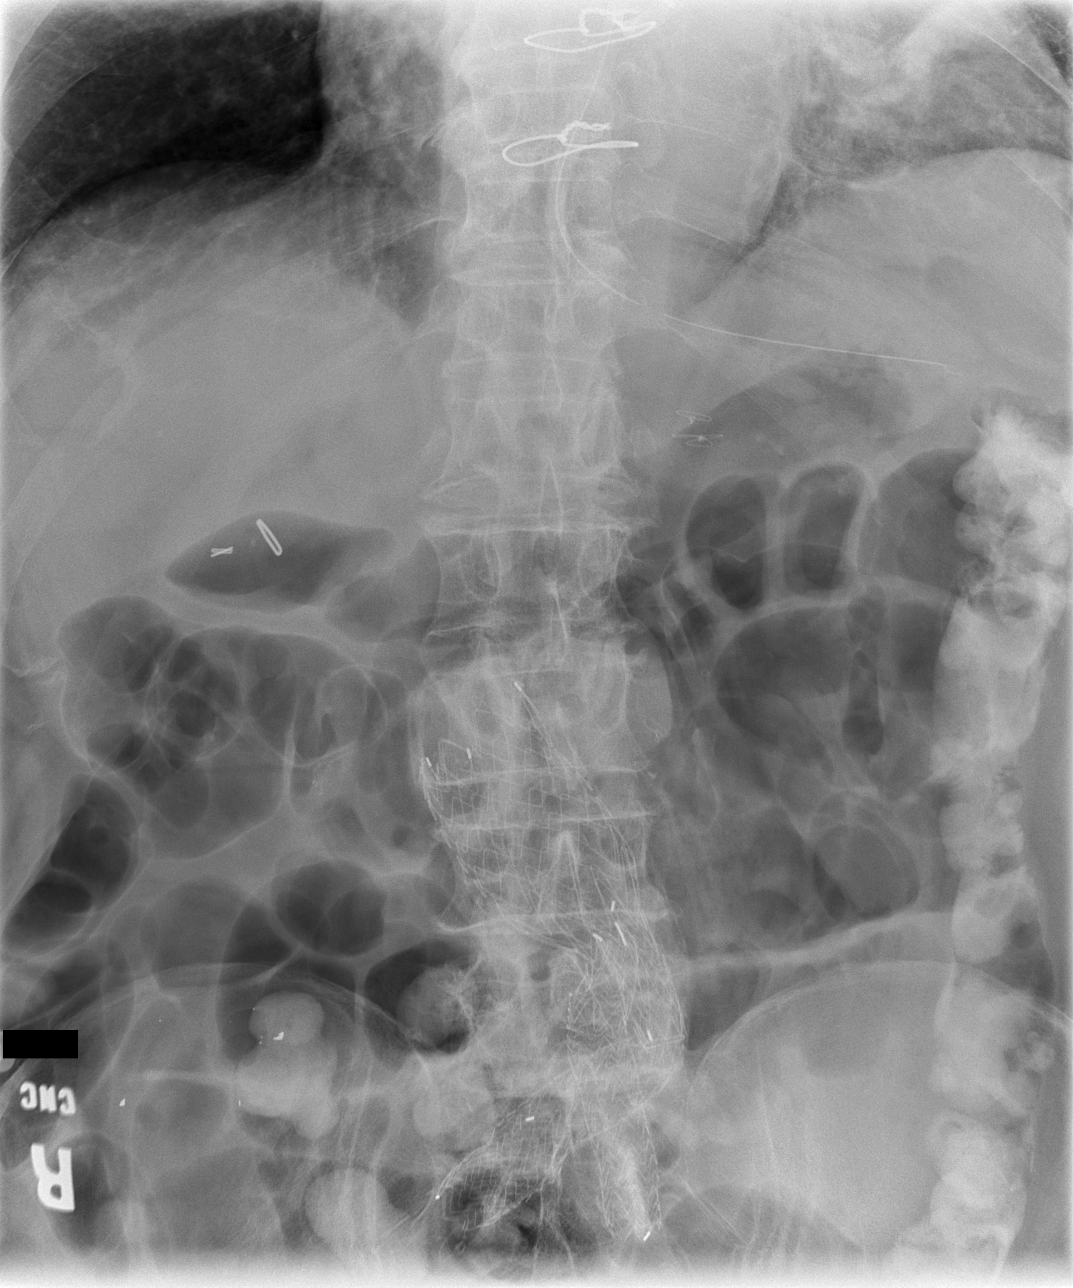
[im 3/3]
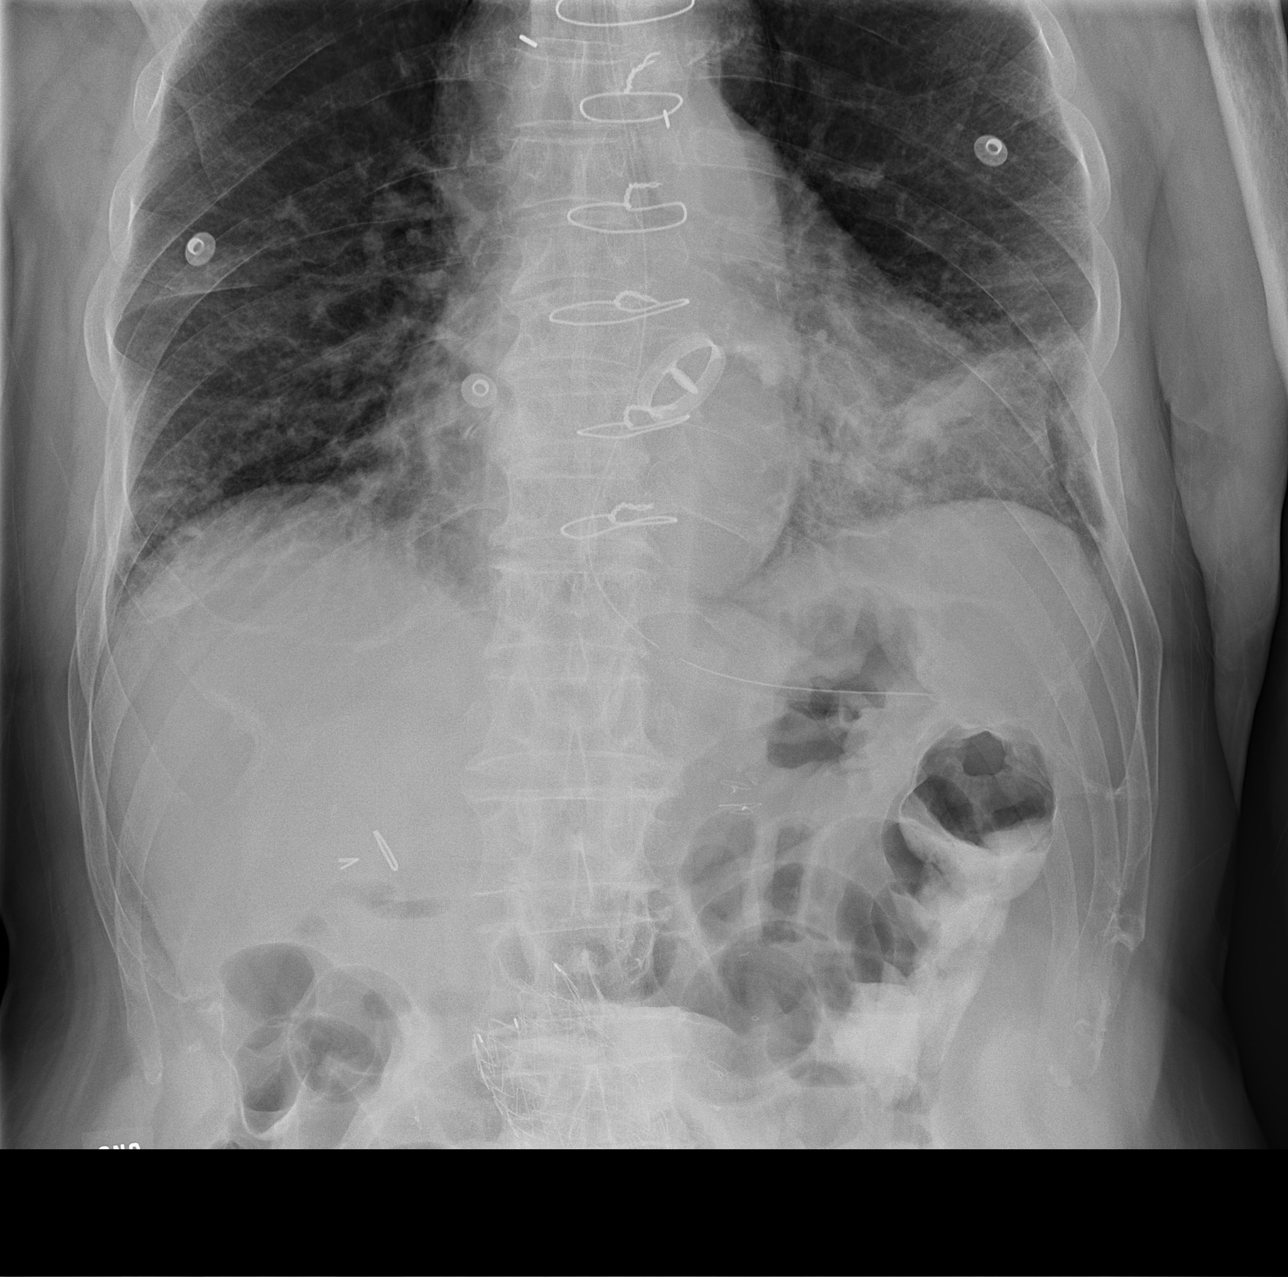

[3 of 3 positions shown; findings below may reference images not displayed]

PROCEDURE:     DXR - DXR ABDOMEN 2 V FLAT AND ERECT  - February 13, 2012  [DATE]

RESULT:     Comparison is made images 12 February, 2012. The contrast from
the CT is in the left colon and sigmoid colon. Aorto iliac stent graft are
present. Cholecystectomy clips are present. There are persistent distended
loops of small bowel which are slightly smaller especially on the erect
views. There is density at the left lung base consistent with infiltrate,
atelectasis or fibrosis. A nasogastric tube is present. The distal tip is in
the gastric fundus. The optimal sidehole is at the gastroesophageal junction.
IMPRESSION: Decreasing dilation of loops of small bowel. Passage of the
oral contrast into the left colon. Other changes as mentioned above.

[REDACTED]

## 2013-09-03 IMAGING — CR DG ABDOMEN 2V
1 series · 2 of 2 positions shown · non-contrast
Comparison: none

REASON FOR EXAM: sbo
COMMENTS:

[Series 7: x abdomen erect · 0.14mm/px · 2 of 2 slices shown]
[im 1/2]
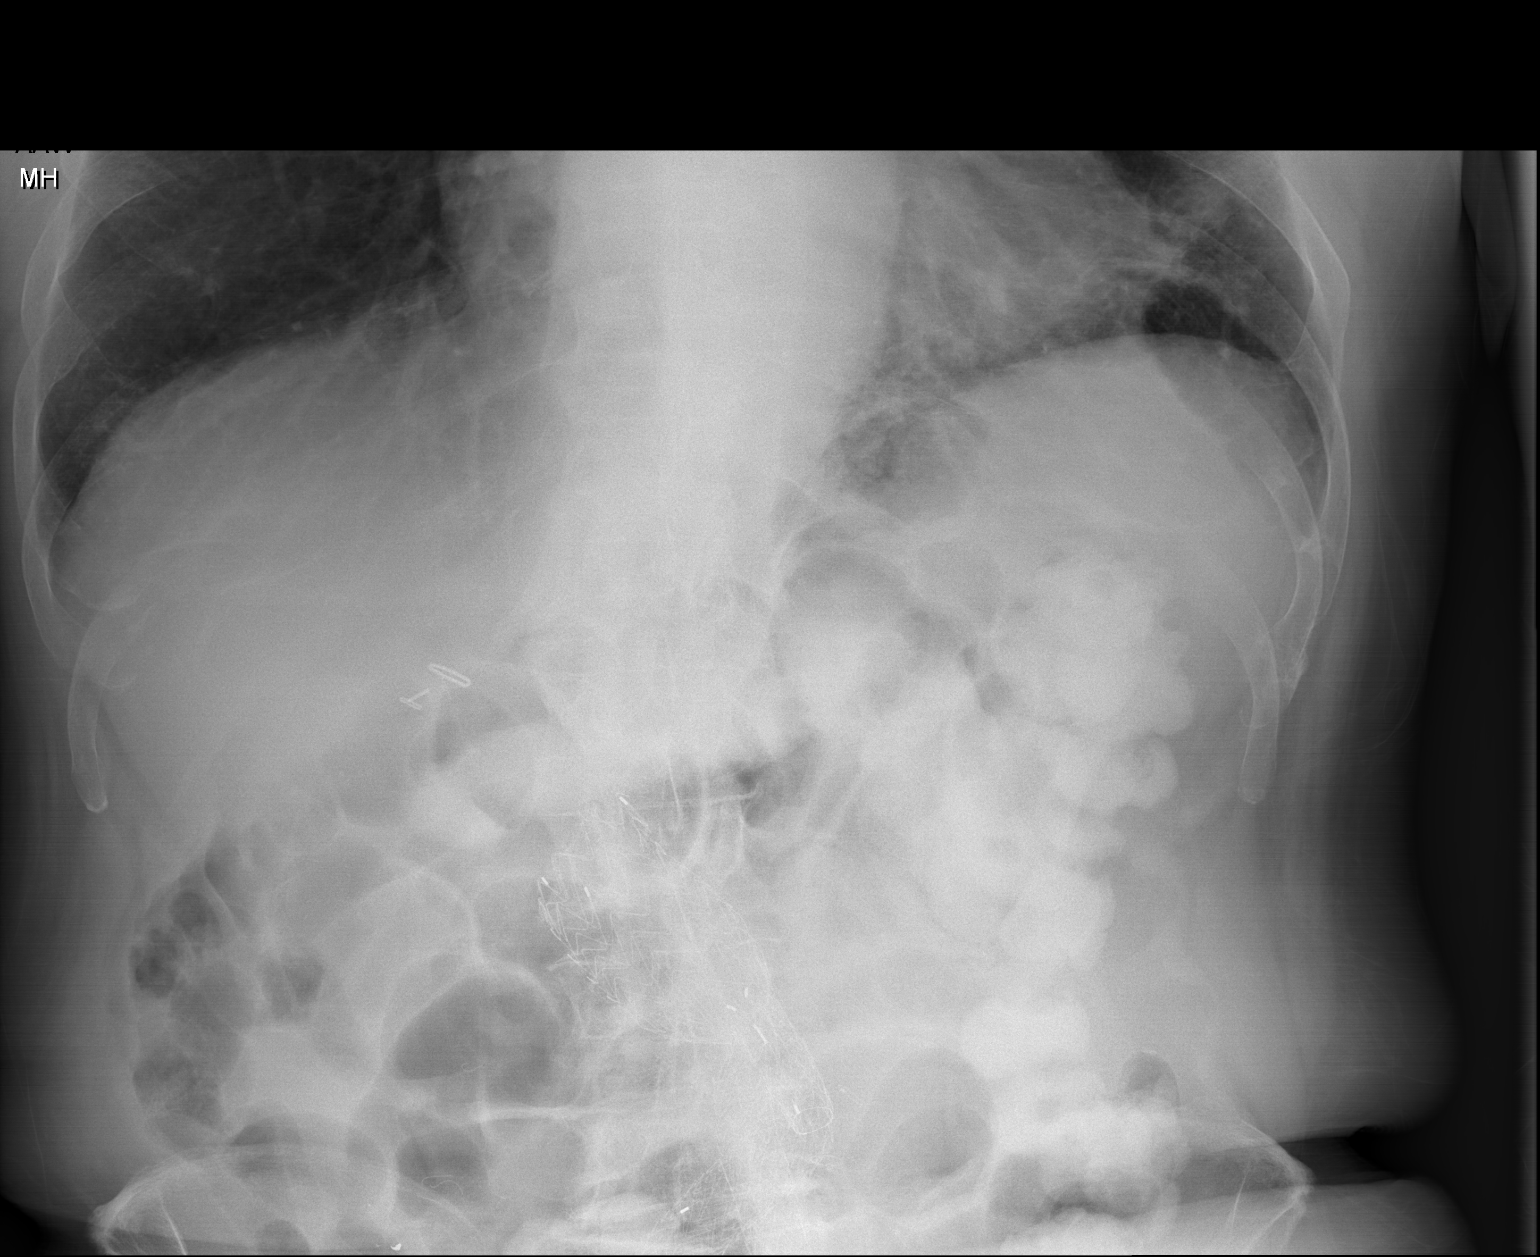
[im 2/2]
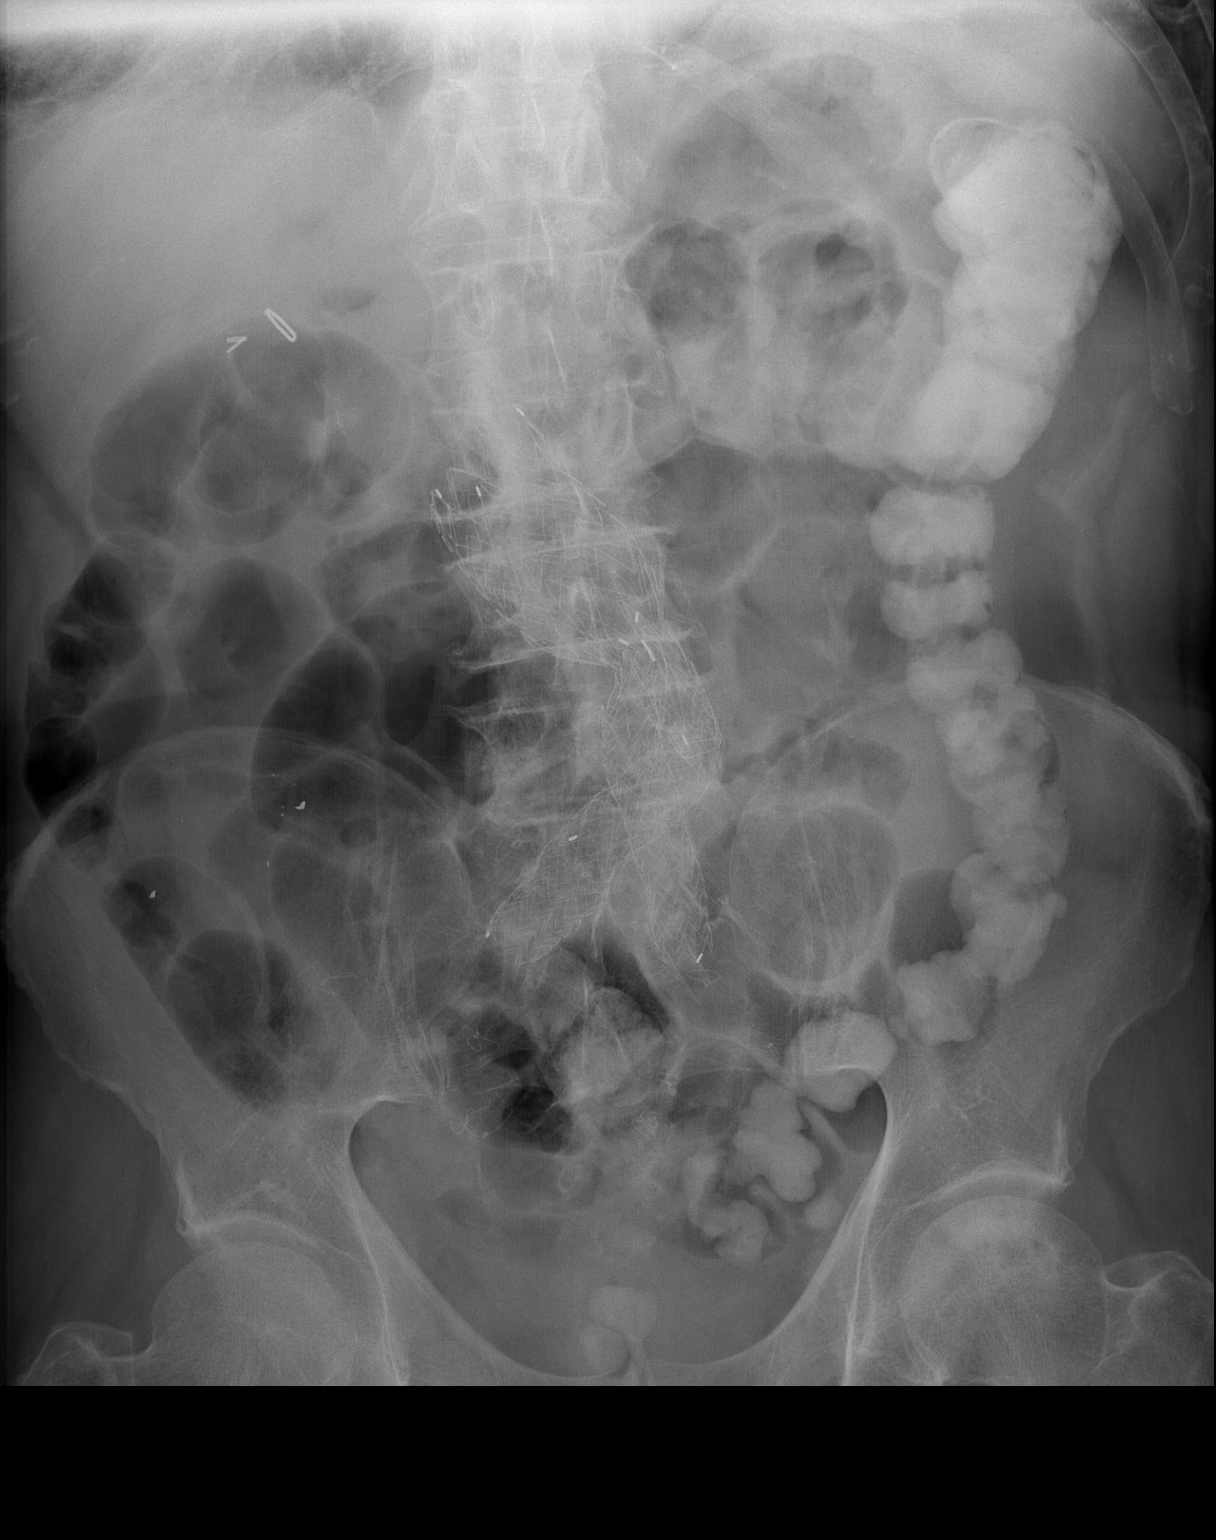

[2 of 2 positions shown; findings below may reference images not displayed]

PROCEDURE:     DXR - DXR ABDOMEN 2 V FLAT AND ERECT  - February 14, 2012  [DATE]

RESULT:

Comparison is made to a prior study dated 02/13/2012.

Air is seen within nondilated loops of large and small bowel. Residual
contrast is identified within the distal transverse and descending colon
regions. An aortoiliac stent graft is identified. The visualized bony
skeleton is unremarkable.
IMPRESSION: Nonobstructive bowel gas pattern with residual contrast.

## 2013-09-12 ENCOUNTER — Telehealth: Payer: Self-pay | Admitting: *Deleted

## 2013-09-12 NOTE — Telephone Encounter (Signed)
Gregory Turner with [facility] called.  He is having surgery Tues. 09/18/13 on Cataract surgery.

## 2013-09-12 NOTE — Telephone Encounter (Signed)
Christell Faith, PA is reviewing.  Awaiting his signature.

## 2013-09-12 NOTE — Telephone Encounter (Signed)
Faxed cardiac clearance for pt to proceed w/ eye surgery on 09/18/13 w/ [facility] to [phone].

## 2013-09-18 ENCOUNTER — Ambulatory Visit: Payer: Self-pay | Admitting: Ophthalmology

## 2013-10-09 ENCOUNTER — Ambulatory Visit: Payer: Self-pay | Admitting: Ophthalmology

## 2013-11-02 ENCOUNTER — Ambulatory Visit: Payer: Self-pay | Admitting: Internal Medicine

## 2013-11-07 ENCOUNTER — Other Ambulatory Visit: Payer: Self-pay | Admitting: *Deleted

## 2013-11-07 DIAGNOSIS — I712 Thoracic aortic aneurysm, without rupture, unspecified: Secondary | ICD-10-CM

## 2013-11-09 ENCOUNTER — Other Ambulatory Visit: Payer: Self-pay | Admitting: *Deleted

## 2013-11-09 DIAGNOSIS — I712 Thoracic aortic aneurysm, without rupture, unspecified: Secondary | ICD-10-CM

## 2013-11-18 DEATH — deceased

## 2013-11-22 IMAGING — CR RIGHT TIBIA AND FIBULA - 2 VIEW
1 series · 3 of 3 positions shown · non-contrast
Comparison: none

REASON FOR EXAM: s/p fall
COMMENTS:

[Series 1: ap · 0.17mm/px · 3 of 3 slices shown]
[im 1/3]
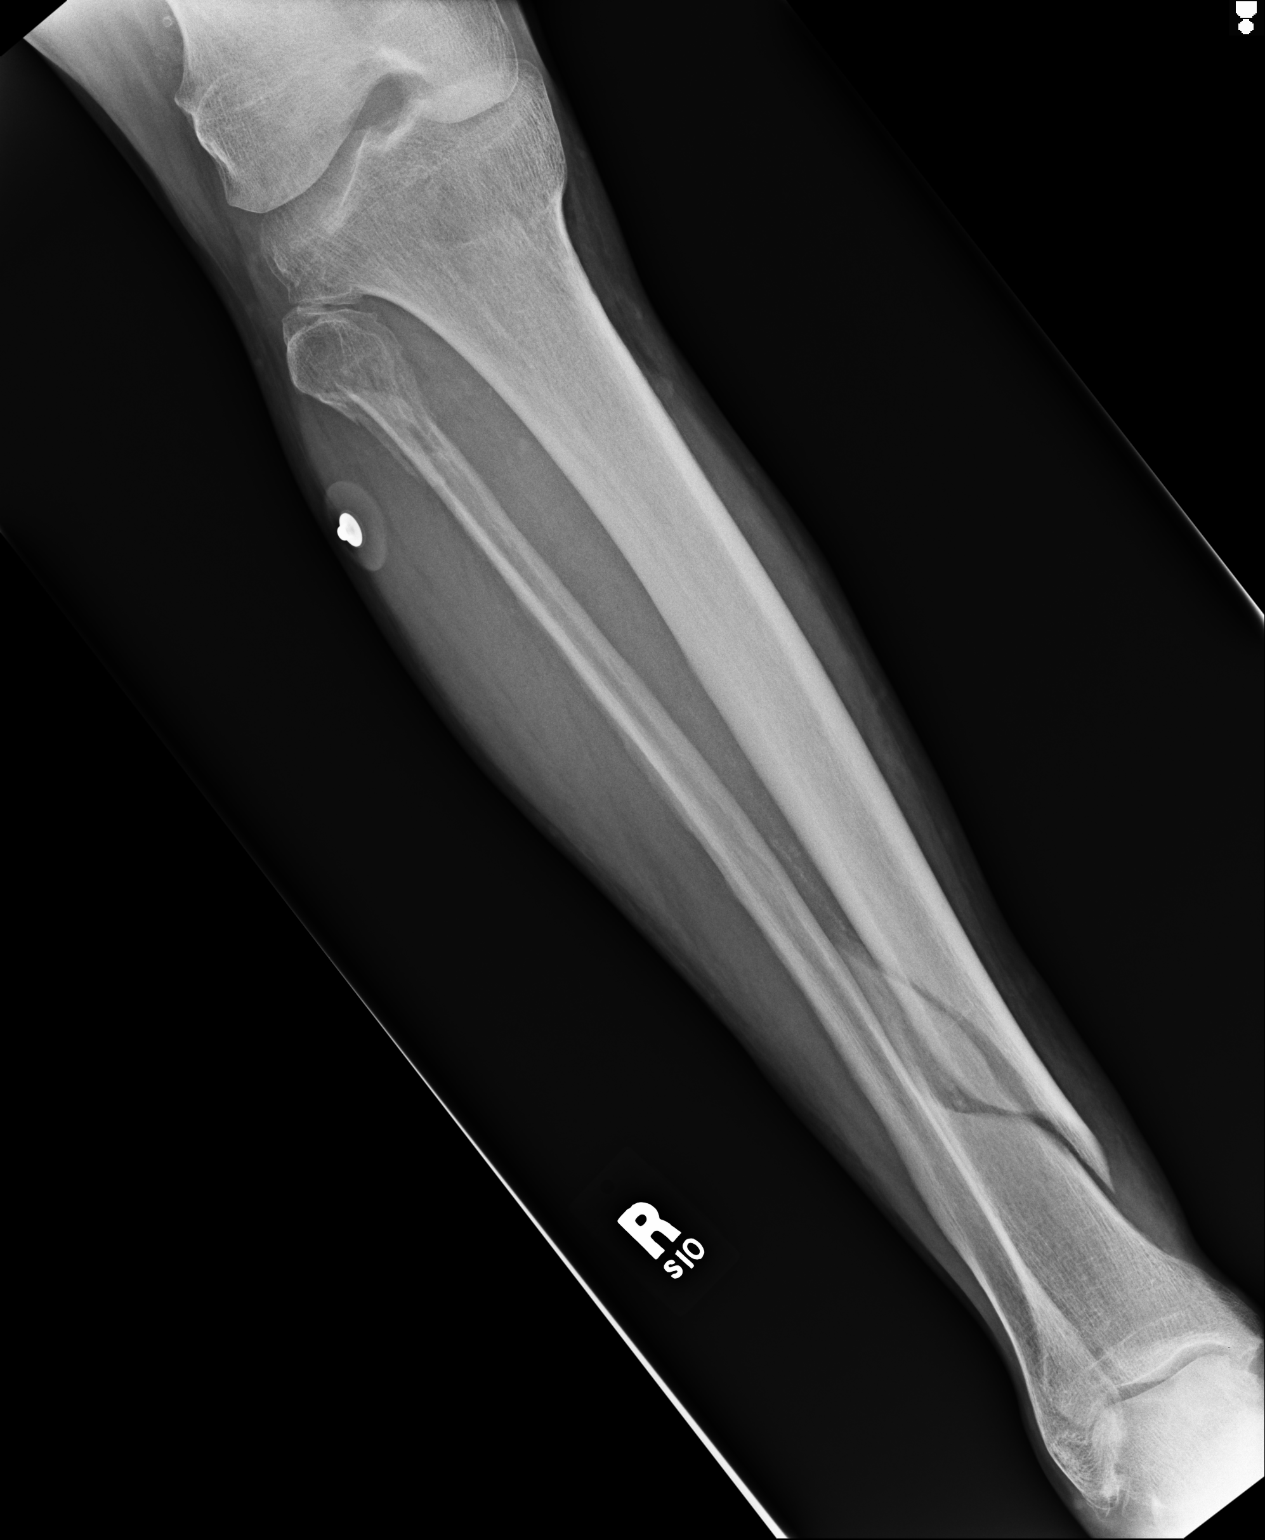
[im 2/3]
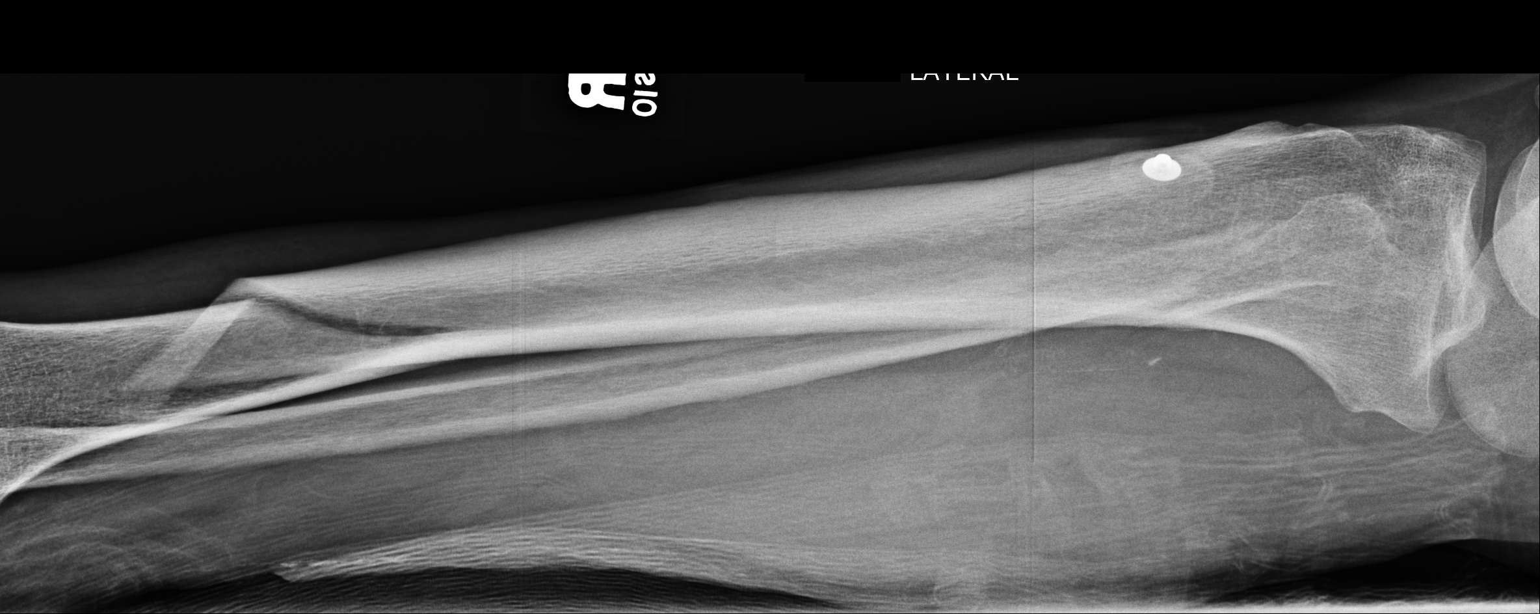
[im 3/3]
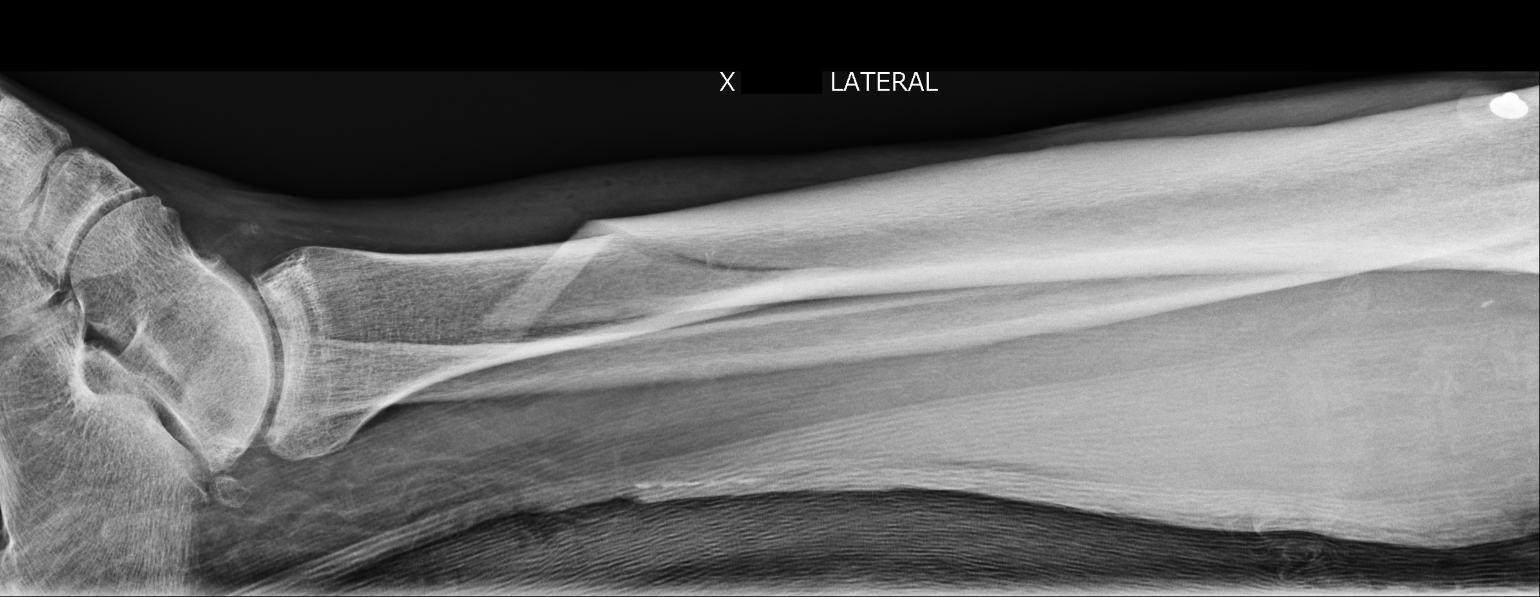

[3 of 3 positions shown; findings below may reference images not displayed]

PROCEDURE:     DXR - DXR TIBIA AND FIBULA RT (LOWER L  - May 04, 2012 [DATE]

RESULT:     And there is fracture in the distal third of the right tibia
with slight distraction and lateral displacement of the distal portion.
There is a spiral appearance of the fracture. There may be a fracture in the
proximal right fibula which is poorly demonstrated.
IMPRESSION: Distal right tibial and proximal right fibular fractures as
described.

[REDACTED]

## 2013-11-22 IMAGING — CR DG SHOULDER 3+V*R*
1 series · 4 of 4 positions shown · non-contrast
Comparison: none

REASON FOR EXAM: Pain and swelling
COMMENTS:   Bedside (portable):Y

PROCEDURE:     DXR - DXR SHOULDER RIGHT COMPLETE  - May 04, 2012  [DATE]
RESULT:     Comparison: None.

[Series 1: internal rotate · 0.17mm/px · 4 of 4 slices shown]
[im 1/4]
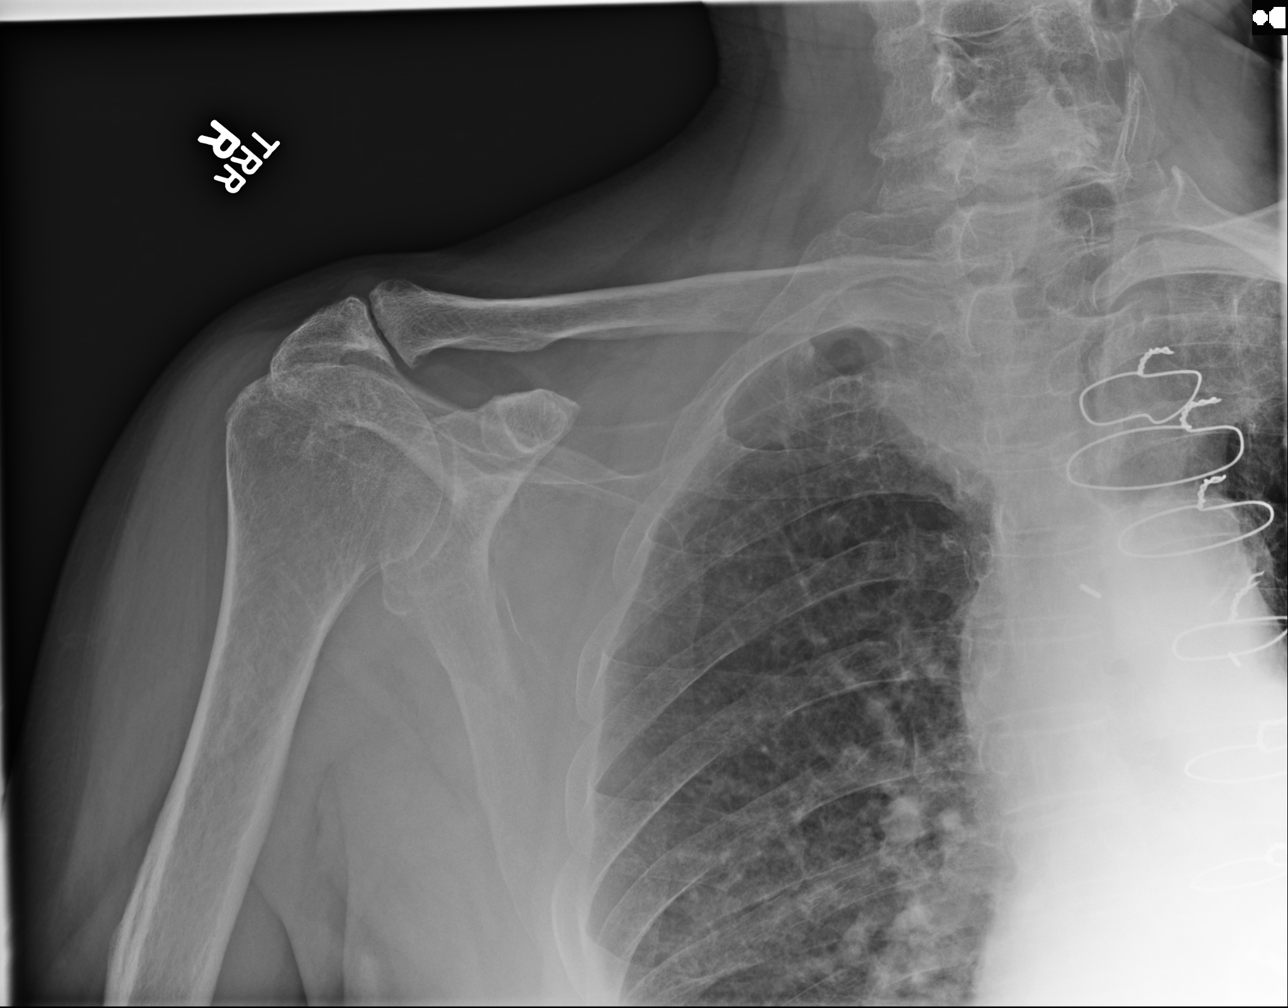
[im 2/4]
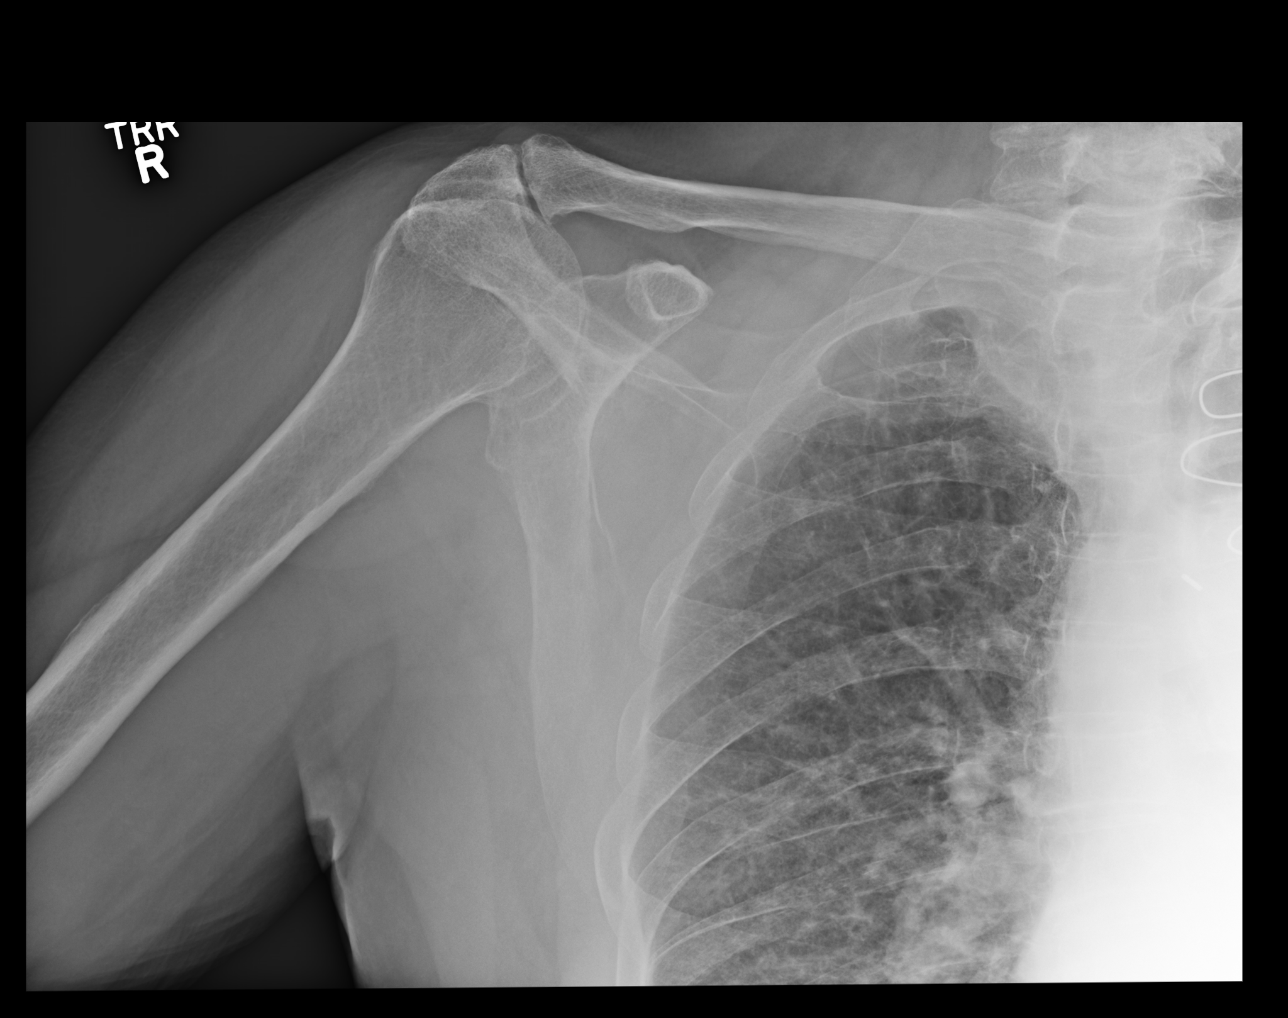
[im 3/4]
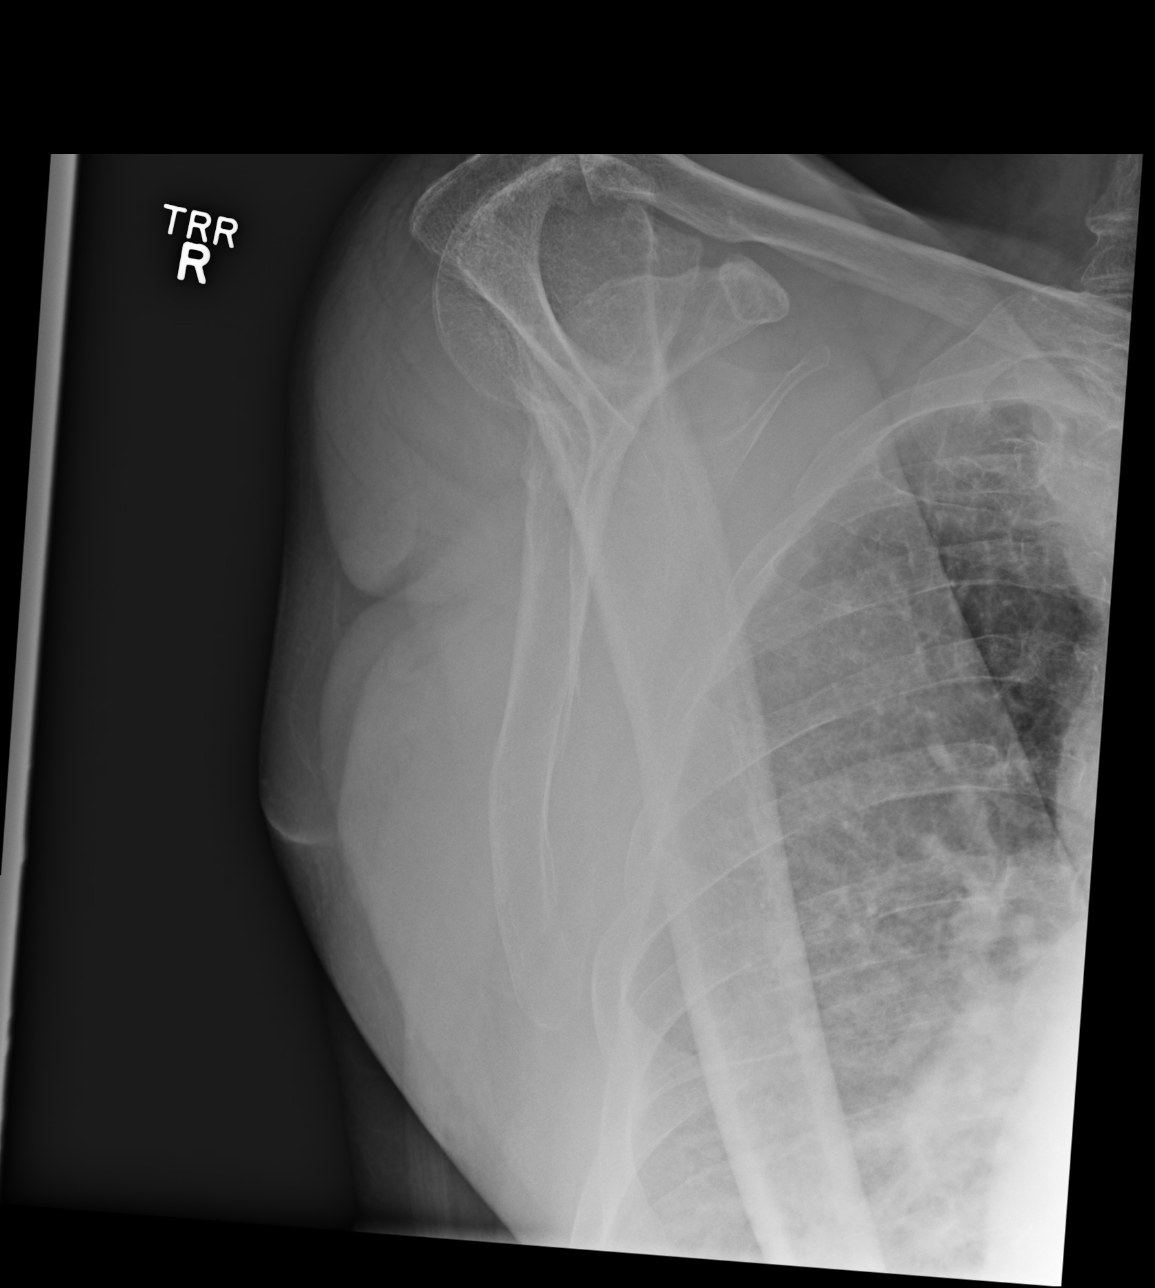
[im 4/4]
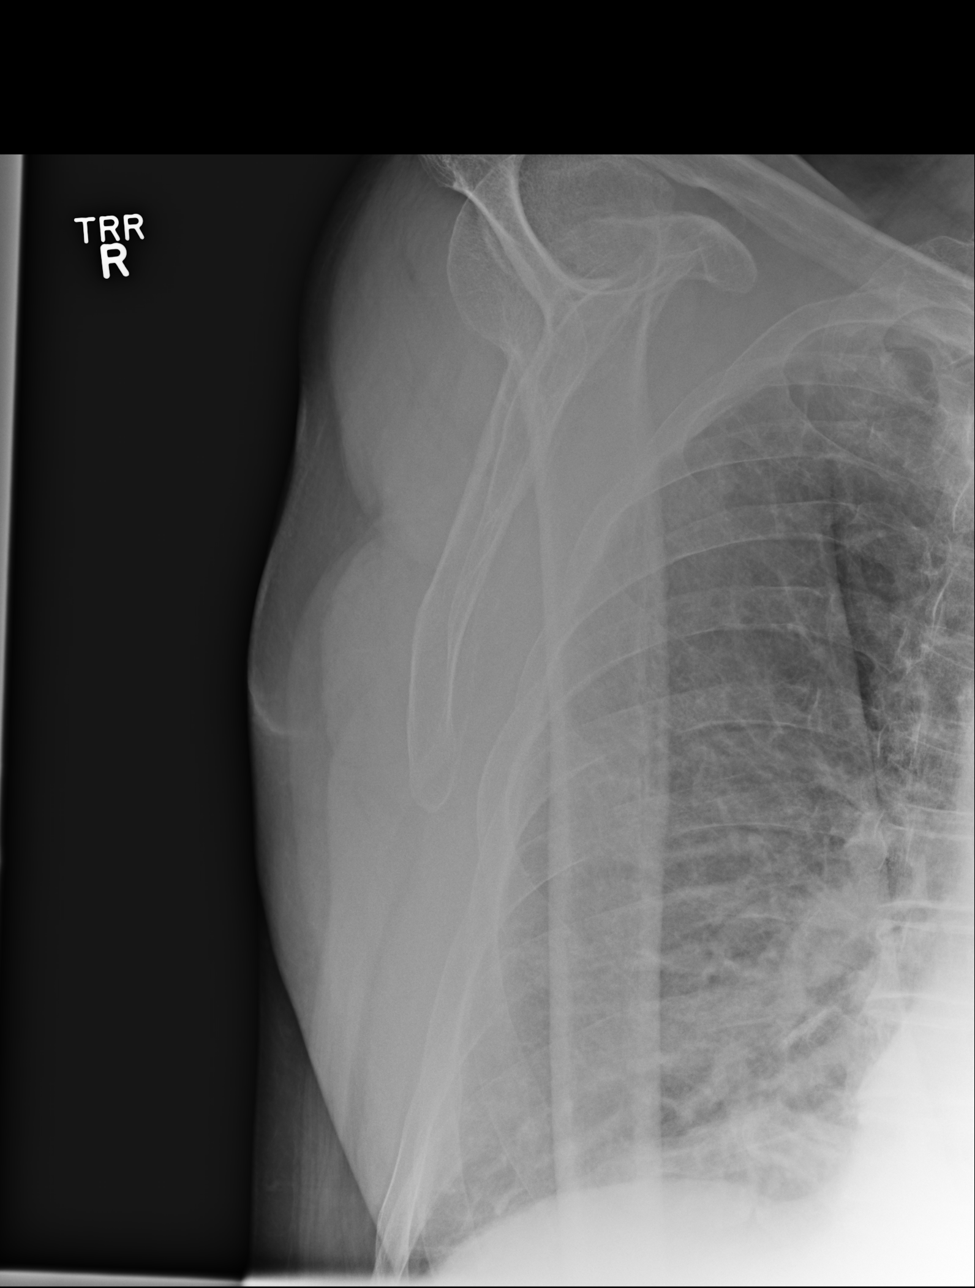

[4 of 4 positions shown; findings below may reference images not displayed]

FINDINGS: There is mild degenerative change of the acromioclavicular joint. No acute
fracture or dislocation.  Mild prominence of the right superior mediastinum
is likely secondary to tortuous great vessels, and similar to at least
12/14/2010 chest radiograph.
IMPRESSION: No acute fracture or dislocation.

[REDACTED]

## 2013-11-22 IMAGING — CR DG CHEST 1V PORT
1 series · 2 of 2 positions shown · non-contrast
Comparison: none

REASON FOR EXAM: surg clearance
COMMENTS:

[Series 1: ap · 0.17mm/px · 2 of 2 slices shown]
[im 1/2]
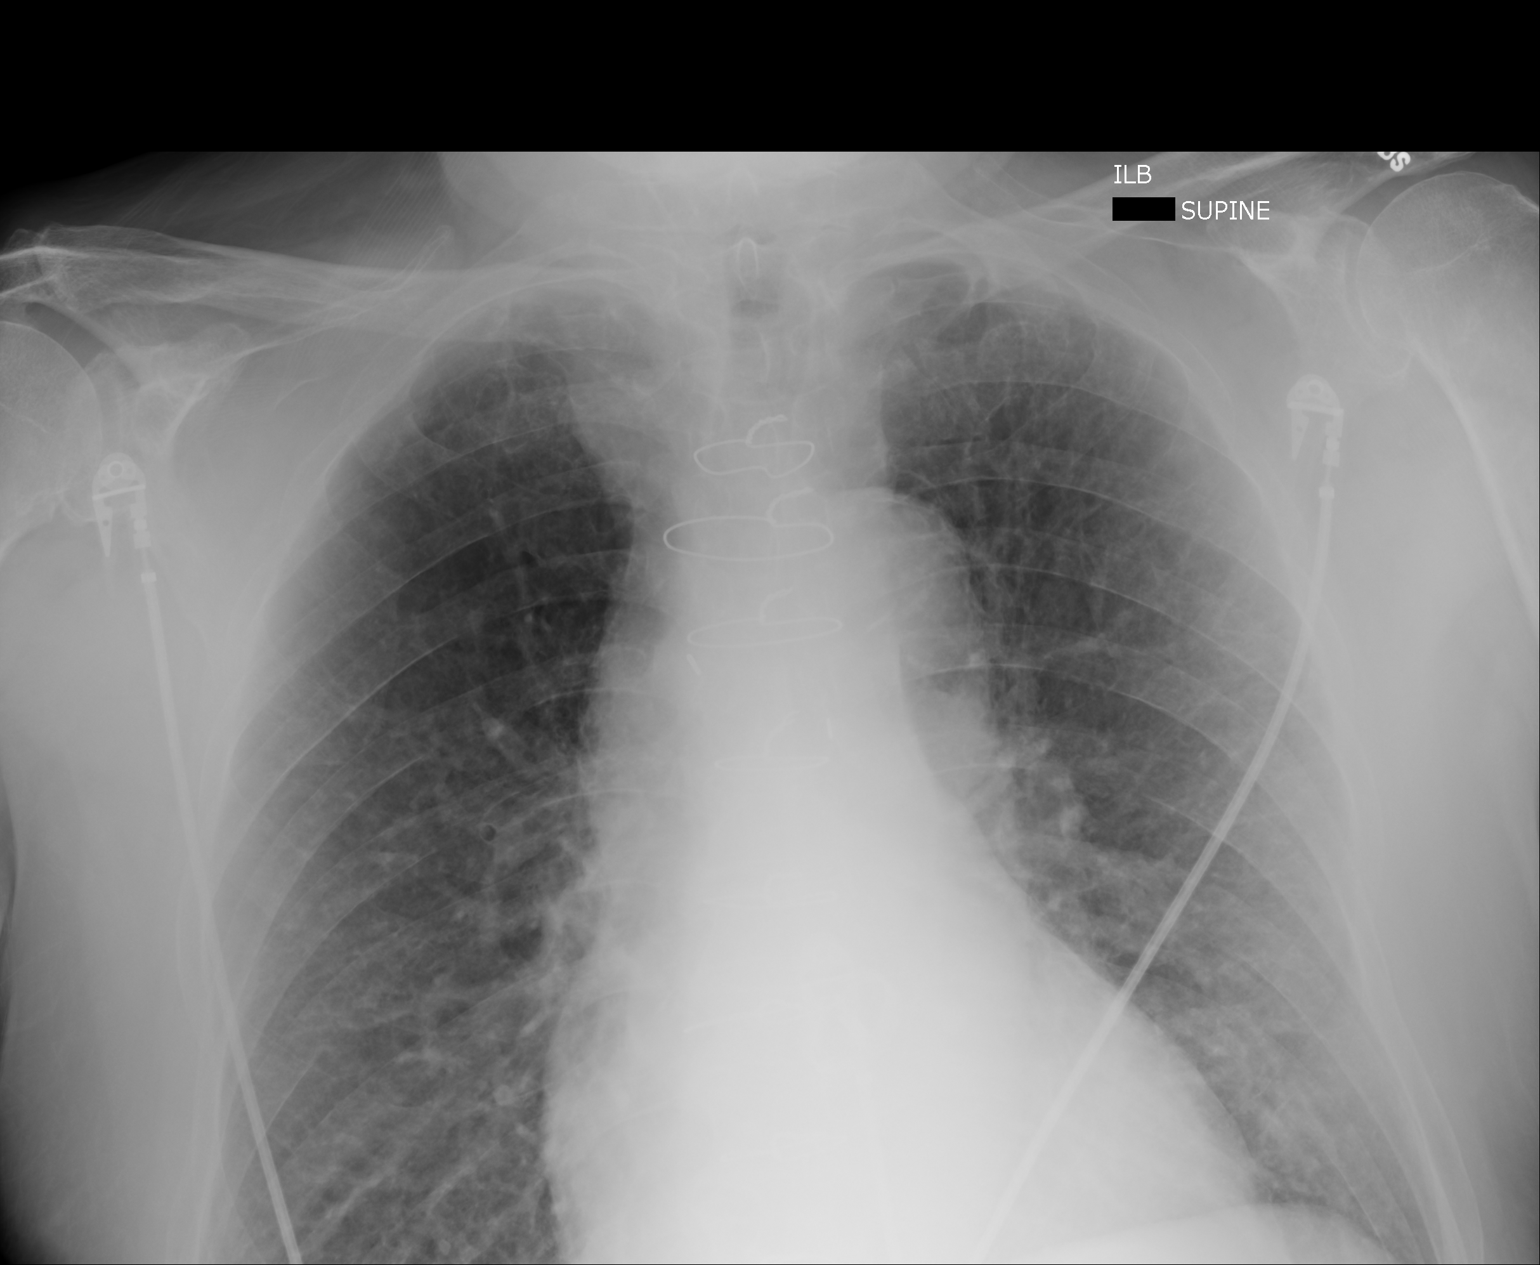
[im 2/2]
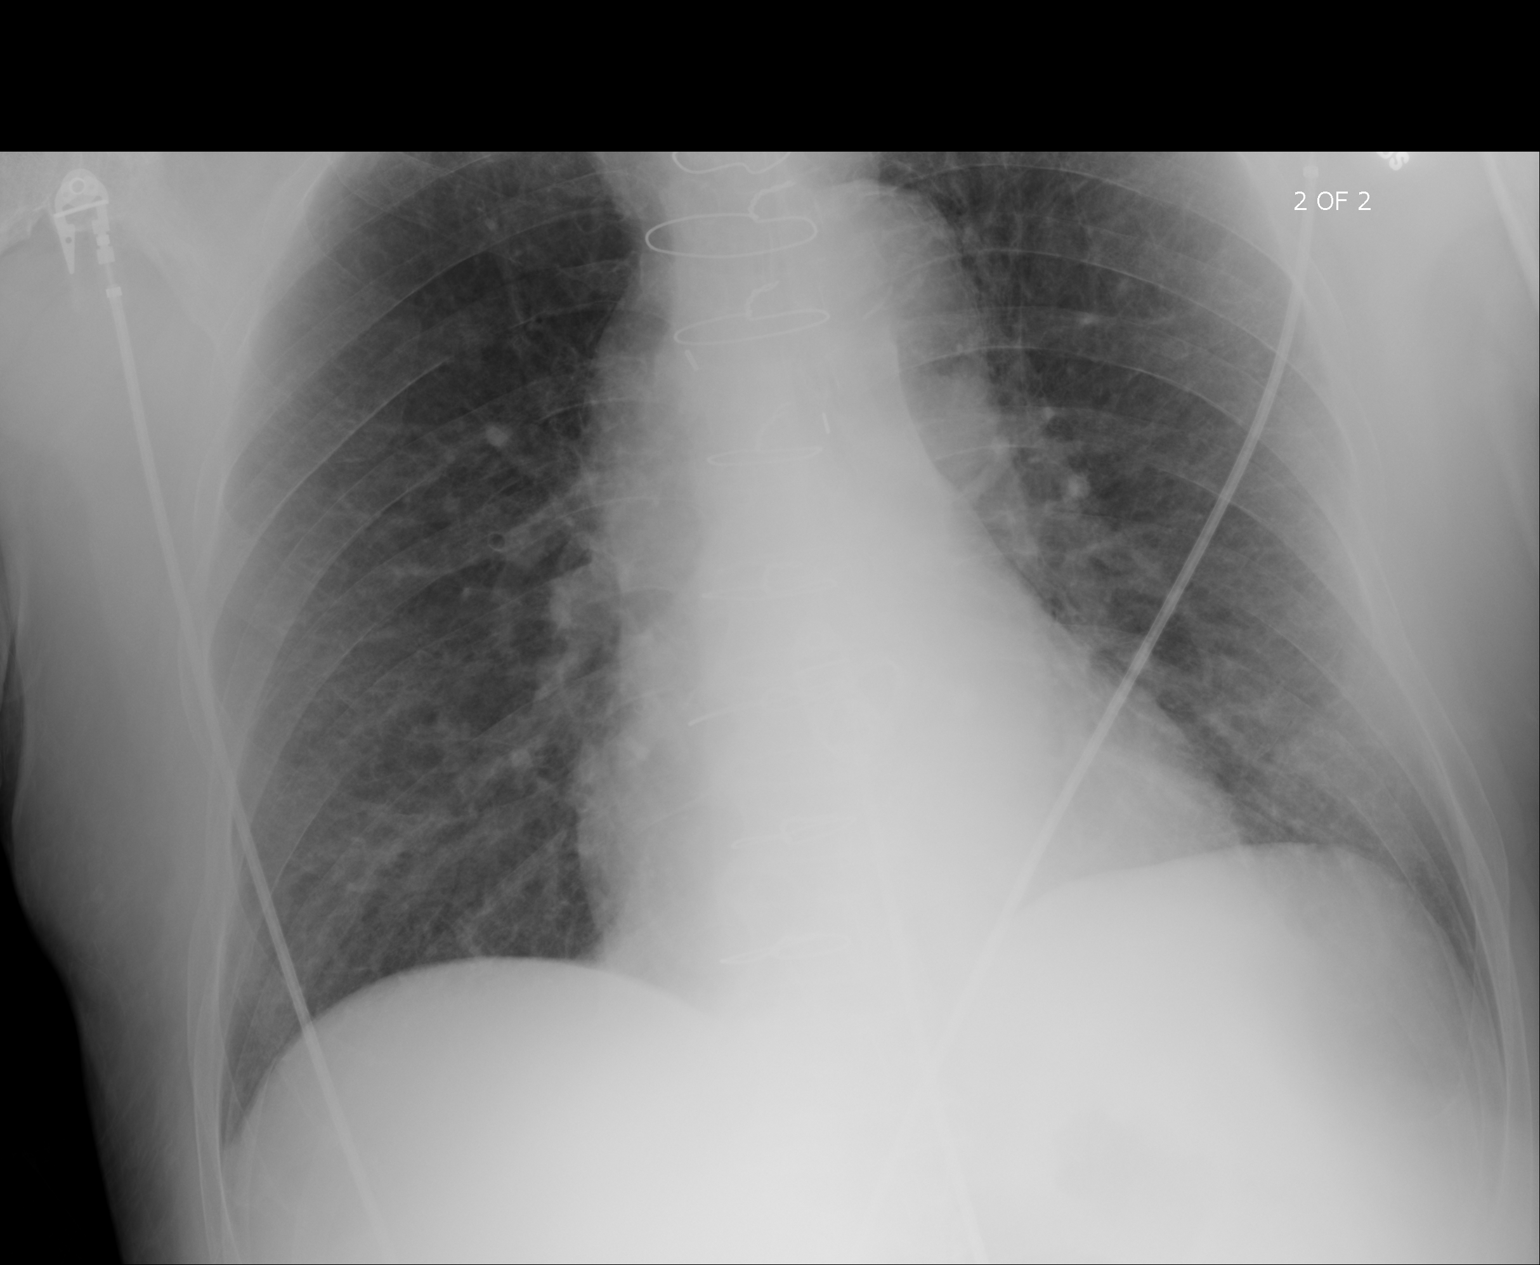

[2 of 2 positions shown; findings below may reference images not displayed]

PROCEDURE:     DXR - DXR PORTABLE CHEST SINGLE VIEW  - May 04, 2012  [DATE]

RESULT:     The lungs are hyperinflated. Sternotomy wires are present. The
interstitial markings are mildly prominent but unchanged since 01 March, 2012 likely secondary to chronic interstitial lung disease. Monitoring
electrodes are present. Bony structures appear unremarkable. The projection
is lordotic.
IMPRESSION: Chronic interstitial lung disease. Stable appearance. CABG
changes are present.

[REDACTED]

## 2013-11-23 IMAGING — CR RIGHT TIBIA AND FIBULA - 2 VIEW
1 series · 3 of 3 positions shown · non-contrast
Comparison: none

REASON FOR EXAM: post op
COMMENTS:   Bedside (portable):Y

PROCEDURE:     DXR - DXR TIBIA AND FIBULA RT (LOWER L  - May 05, 2012 [DATE]
RESULT:     Intramedullary rod noted within the tibia. Good anatomic
alignment. Distal tibial shaft  fracture present. Oblique fracture of the
proximal fibular  diaphysis noted.

[Series 1: ap · 0.17mm/px · 3 of 3 slices shown]
[im 1/3]
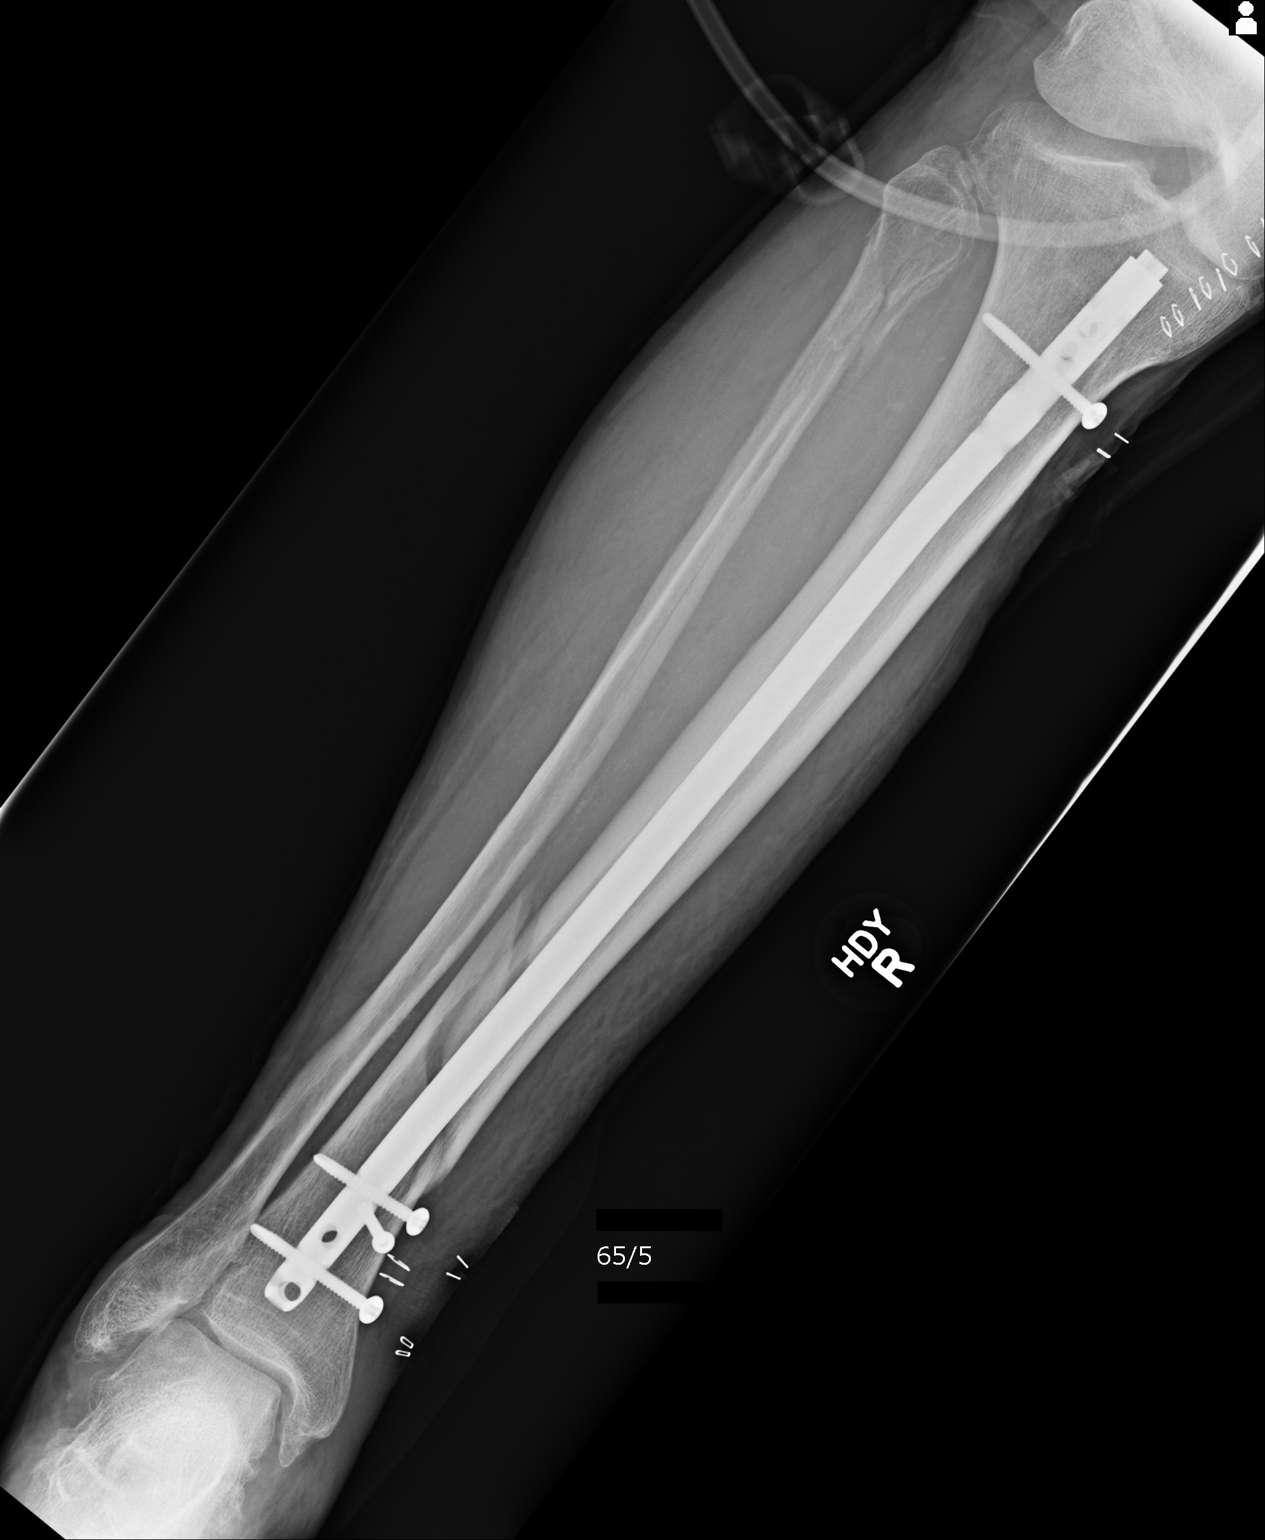
[im 2/3]
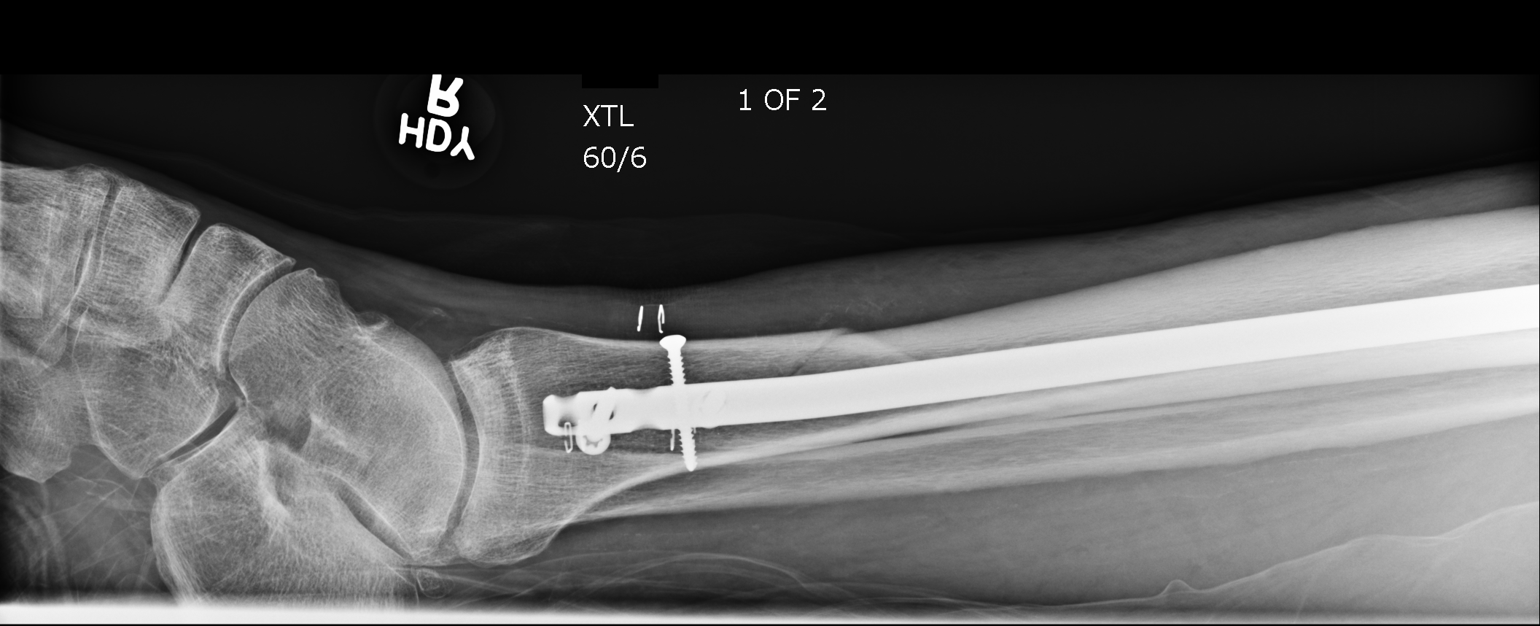
[im 3/3]
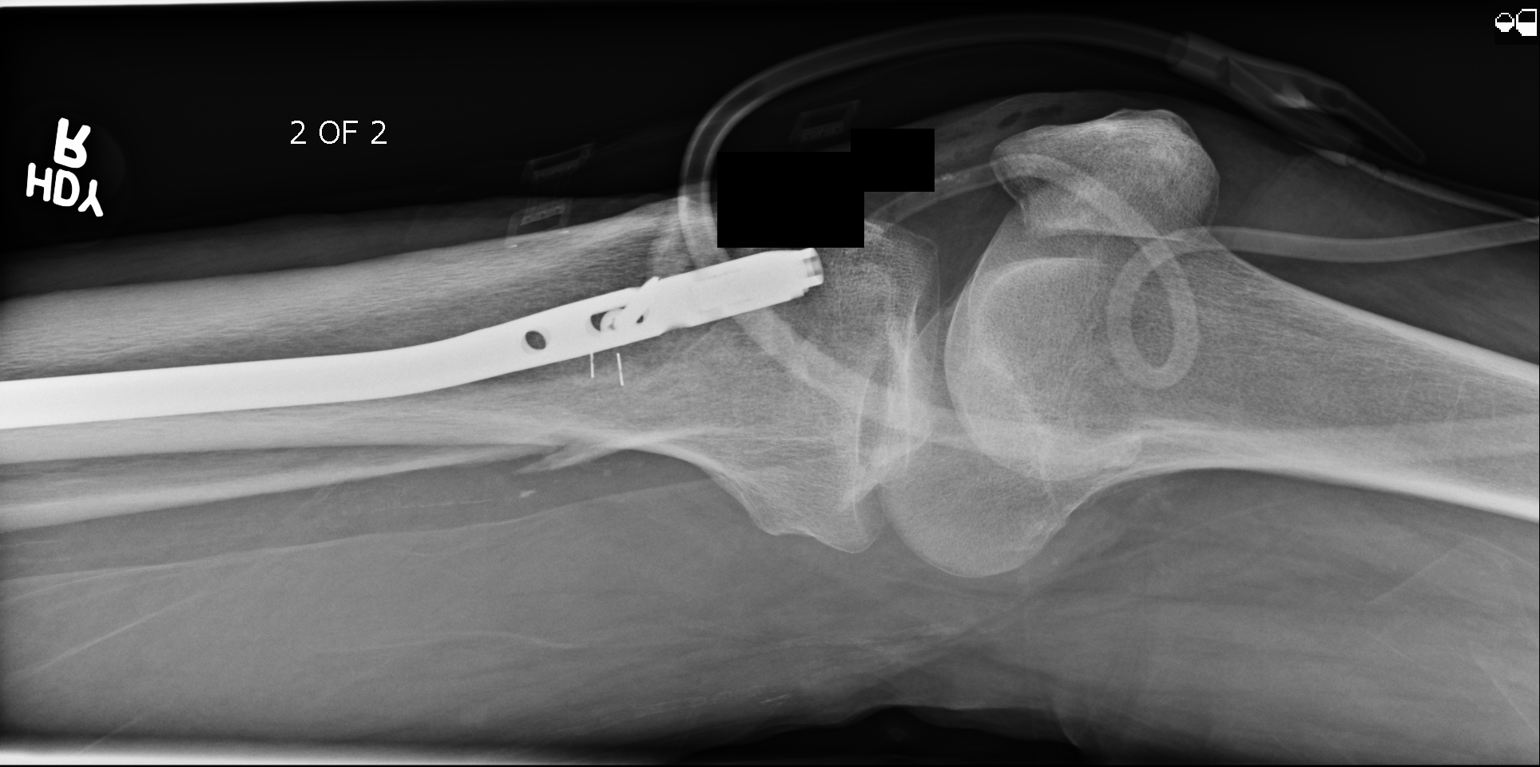

[3 of 3 positions shown; findings below may reference images not displayed]

IMPRESSION: Good anatomic positioning of intramedullary rod and for
tibial fracture repair. Proximal fibular fracture.

## 2013-11-23 IMAGING — CR DG C-ARM 1-60 MIN
2 series · 2 of 2 positions shown · non-contrast
Comparison: none

REASON FOR EXAM: Fracture Reduction/Fixation
COMMENTS:

PROCEDURE:     DXR - DXR C-ARM WITH SPOT IMAGES  - May 05, 2012 [DATE]
RESULT:     Intramedullary rod is noted within the tibia or surgical spot
film.

[m1]
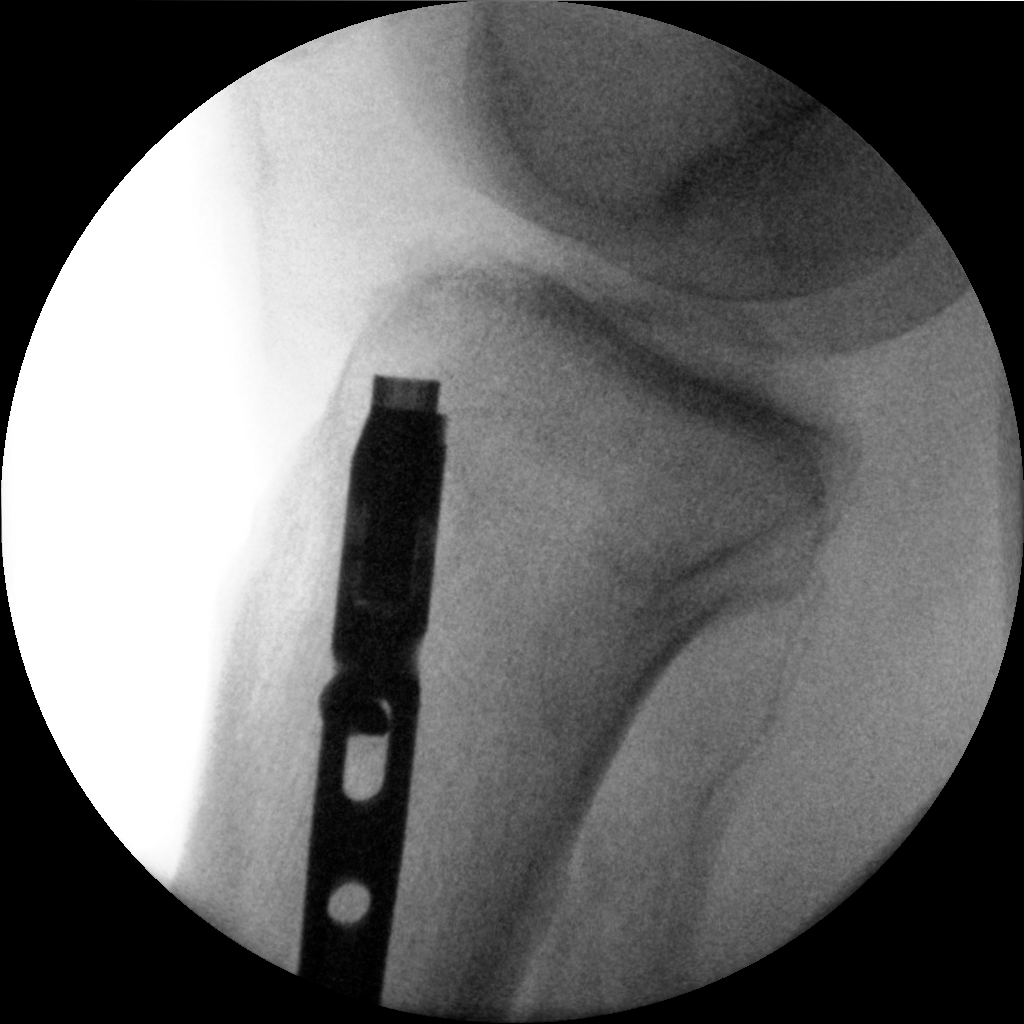

[m2]
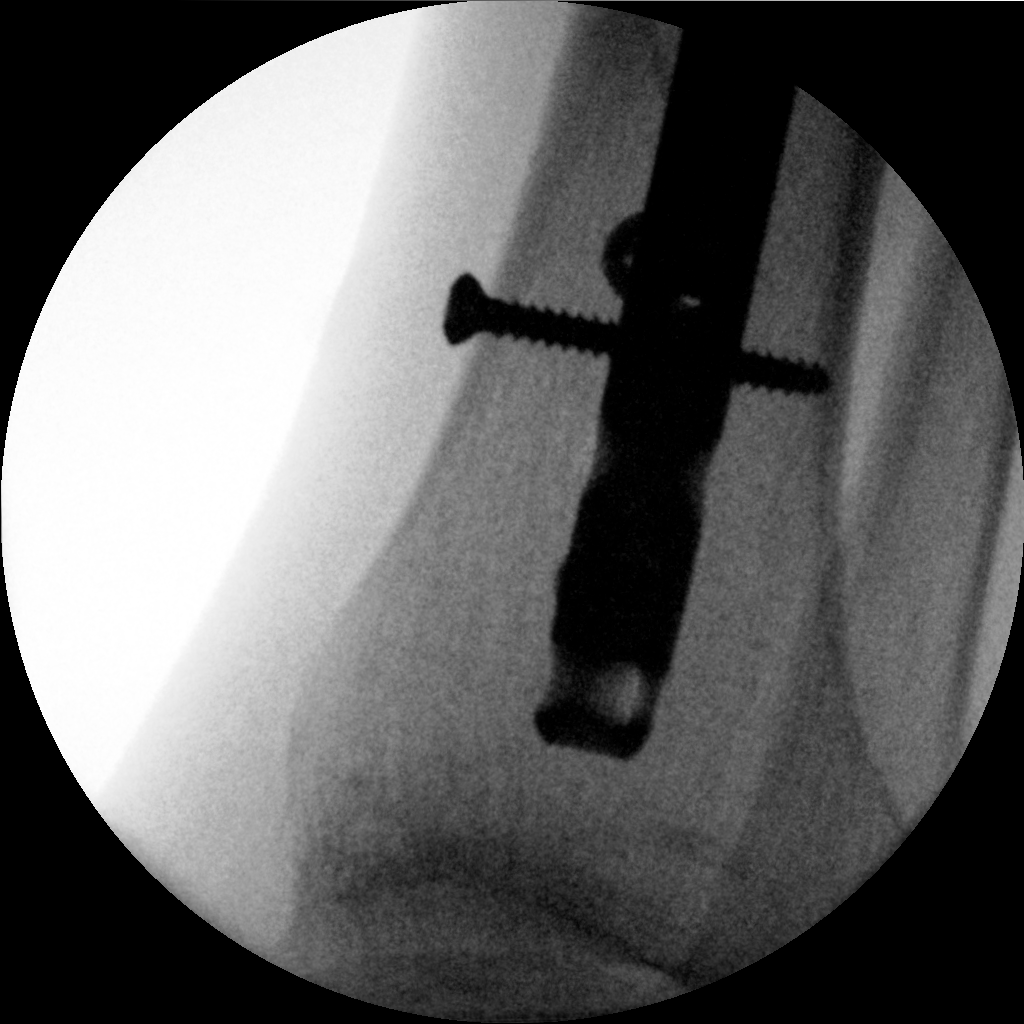

[2 of 2 positions shown; findings below may reference images not displayed]

IMPRESSION: Intramedullary rod noted in the tibia.

## 2013-12-05 ENCOUNTER — Other Ambulatory Visit: Payer: Medicare PPO

## 2013-12-05 ENCOUNTER — Ambulatory Visit: Payer: Medicare PPO | Admitting: Cardiothoracic Surgery

## 2013-12-27 ENCOUNTER — Encounter (HOSPITAL_COMMUNITY): Payer: Self-pay | Admitting: Vascular Surgery

## 2014-01-31 ENCOUNTER — Encounter (HOSPITAL_COMMUNITY): Payer: Self-pay | Admitting: Vascular Surgery

## 2014-02-20 IMAGING — CT CT ANGIO CHEST
2 of 9 series · 10 of 30 positions shown · IV contrast (omnipaque)
Comparison: 07/28/2011, 02/02/2012.

CTA CHEST

CLINICAL DATA: Status Lornenjy Artys to repair abdominal aortic
aneurysm on 06/22/2011.

CT ANGIOGRAPHY CHEST, ABDOMEN AND PELVIS
TECHNIQUE: Multidetector CT imaging through the chest, abdomen and
pelvis was performed using the standard protocol during bolus
administration of intravenous contrast.  Multiplanar reconstructed
images including MIPs were obtained and reviewed to evaluate the
vascular anatomy.
Contrast: 100mL OMNIPAQUE IOHEXOL 350 MG/ML SOLN

[Series 6: angio (id) · axial · 0.70mm/px · z∈[-546,-59]mm · 7 of 254 slices shown]
[im 32/254  lung]
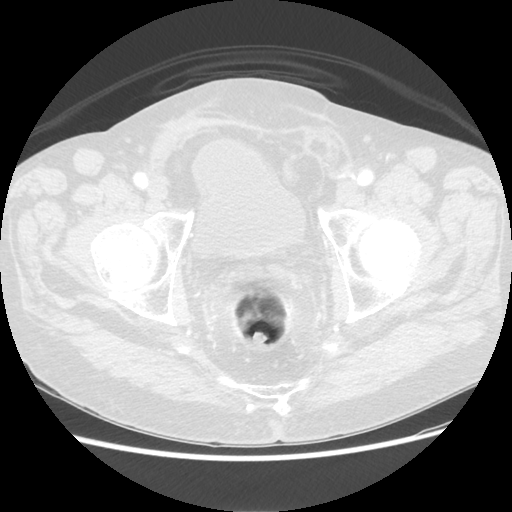
[im 64/254  mediastinal]
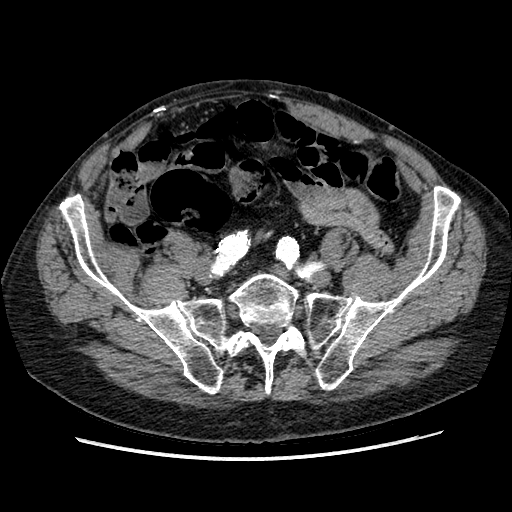
[im 95/254  lung]
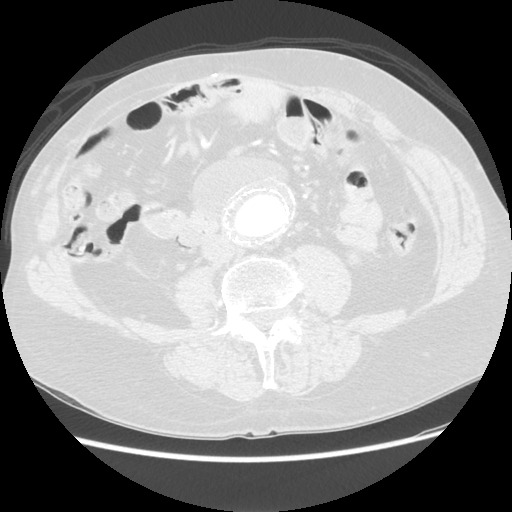
[im 127/254  mediastinal]
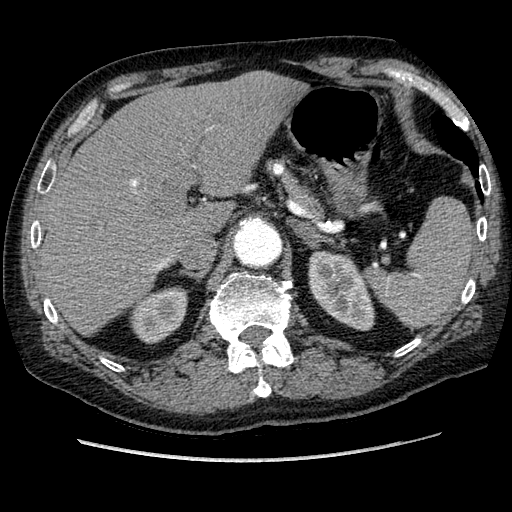
[im 159/254  lung]
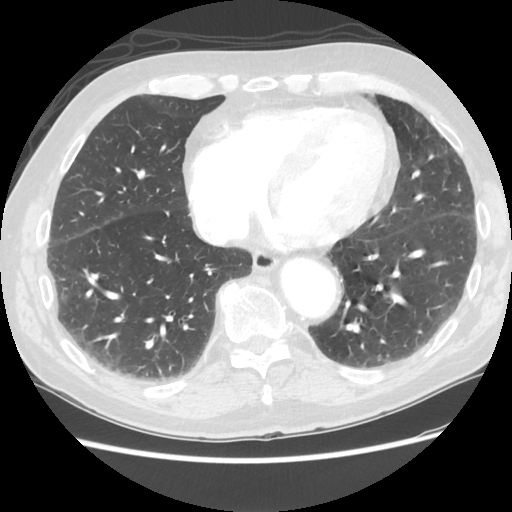
[im 190/254  mediastinal]
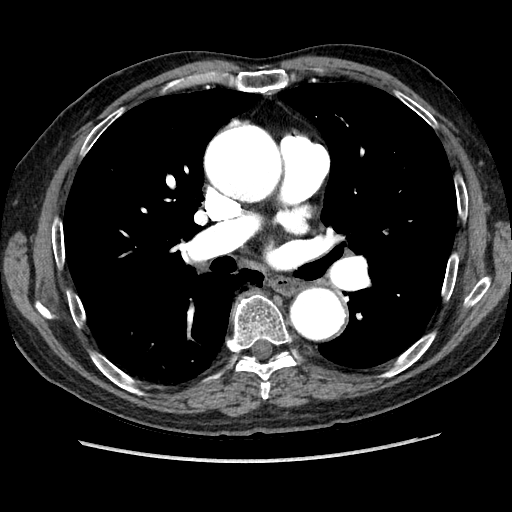
[im 222/254  lung]
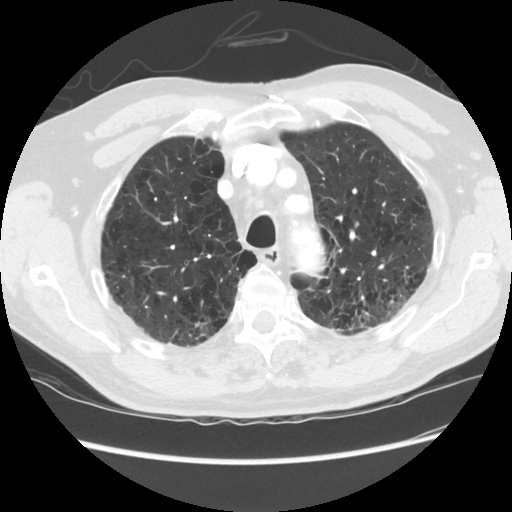

[Series 602: sagittal body · sagittal · 1.32mm/px · 3 of 145 slices shown]
[im 37/145  lung]
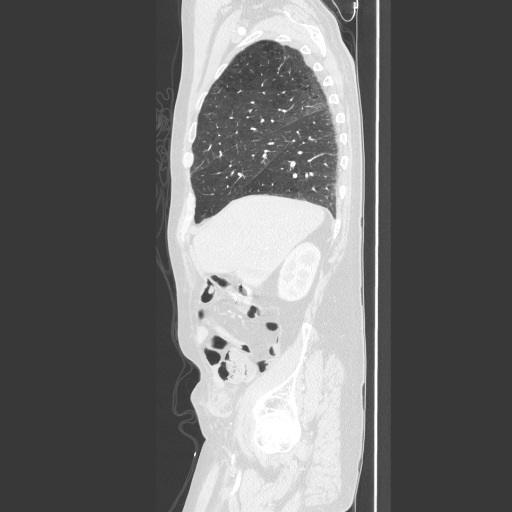
[im 73/145  lung]
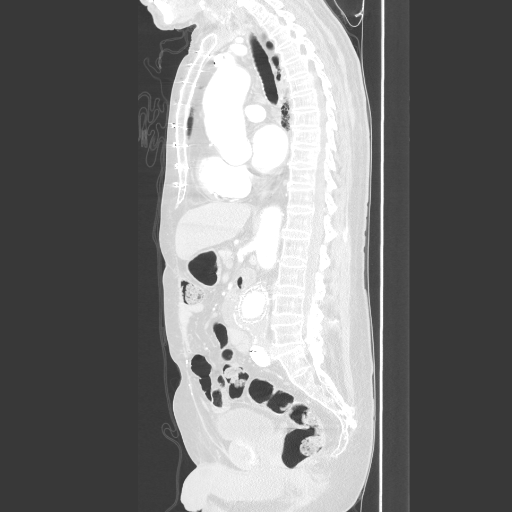
[im 109/145  lung]
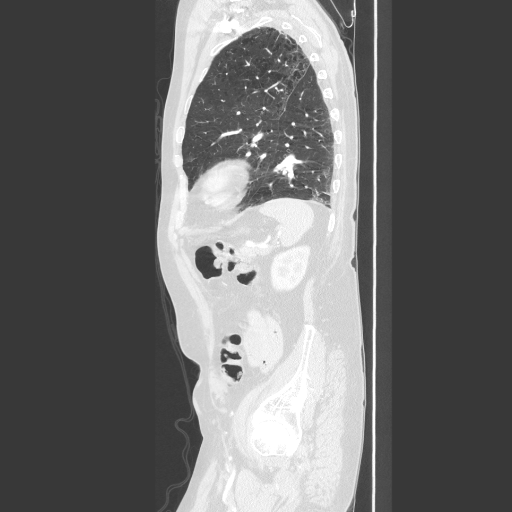

[10 of 30 positions shown; findings below may reference images not displayed]

FINDINGS: The patient has had prior median sternotomy with aortic
valve replacement.  There is dilatation of the ascending thoracic
aorta which reaches a maximal diameter of 5.3 to 5.4 cm just above
the sinotubular junction.  There is no evidence of aortic
dissection.  The proximal arch measures 4.7 cm.  The distal arch
measures 4.0 cm.  Proximal descending thoracic aorta measures
cm.  The mid descending thoracic aorta measures 4.0 cm.  The distal
descending thoracic aorta measures 3.7 cm.  Just above the
diaphragmatic hiatus, the aorta measures 3.5 cm.

Proximal great vessels demonstrate fusiform dilatation of the
innominate artery, measuring 18 mm in proximal diameter.  There is
partially ulcerated plaque at the origin of the left subclavian
artery without evidence of significant stenosis.  The right
vertebral artery is dominant.  The origins of both common carotid
arteries show normal patency.

Overall heart size is felt to be within normal limits.  There is
subjective narrowing of the pulmonary outflow tract to 11 mm of
unclear significance.  The central pulmonary arteries are not
overtly dilated.  Calcified plaque is present in the distribution
of the LAD, left circumflex and also likely in the mid right
coronary artery.  No pleural or pericardial fluid is seen.

Lung windows show emphysematous changes of the upper lobes
bilaterally.  No pulmonary edema, focal consolidation or nodule is
identified.  No enlarged lymph nodes are seen.  Degenerative
changes are present in the thoracic spine.

 Review of the MIP images confirms the above findings.
IMPRESSION: 1.  Aneurysmal disease of the ascending thoracic aorta, measuring
5.4 cm in greatest measured diameter.  No associated aortic
dissection is seen.  The patient is status post aortic valve
replacement.
2.  Fusiform dilatation of the innominate artery to 18 mm.
3.  Calcified coronary artery plaque in a three-vessel
distribution.
4.  Nonspecific visible narrowing of the pulmonary outflow tract.

CTA ABDOMEN AND PELVIS
FINDINGS: The aortic endograft shows stable positioning since the
prior CTA with no evidence of further migration or kink.  The
thrombosed aneurysm sac surrounding the endograft shows slight
decrease in size with transverse dimensions currently of
approximately 3.7 x 4.9 cm.  This compares to previous dimensions
of 4.0 x 5.0 cm. There is no evidence of aortic endoleak.
Component of mural thrombus along the superior margin of the
endograft is stable since the prior study.

Endograft limbs are widely patent.  Native iliac arteries and
femoral arteries show stable patency without significant disease.
Visceral arteries show stable patency with normal patency of a
common celiacomesenteric trunk and bilateral renal arteries.

Solid organs show stable appearance.  There is a stable duodenal
diverticulum.  Stable adjacent fluid collection may be part of the
diverticulum or a separate duplication cyst.  There is decreased
prominence of a right inguinal hernia which no longer contains
bowel.  The bladder is unremarkable.  Stable mild degenerative
changes are noted in the spine.

 Review of the MIP images confirms the above findings.
IMPRESSION: Stable positioning and patency of the endograft with no further
migration.  The thrombosed aneurysm sac shows slight reduction in
size, now measuring 3.7 x 4.9 cm.  There is no evidence of
endoleak.

## 2014-05-10 NOTE — Op Note (Signed)
PATIENT NAME:  Gregory Turner, Gregory Turner MR#:  [id number] DATE OF BIRTH:  1940/09/27  DATE OF PROCEDURE:  05/05/2012  PREOPERATIVE DIAGNOSIS: Right distal tibia fracture.  POSTOPERATIVE DIAGNOSIS:   Right distal tibia fracture.  PROCEDURE: Open reduction internal fixation with closed tibial nailing, right tibia.   SURGEON: Laurene Footman, MD  ANESTHESIA:  General.  DESCRIPTION OF PROCEDURE: The patient was brought to the Operating Room and after adequate anesthesia was obtained, the right leg was prepped and draped in the usual sterile fashion with a tourniquet applied to the upper thigh. After appropriate patient identification and timeout procedures were completed, the proximal incision was made medial to the patellar tendon and retractors placed. Then a start hole was made and the guidewire inserted down the canal.  The distal fragment wanted to displace laterally and so a medial blocking screw was inserted with a small stab incision, drilling and then placing a 3.5 mm cortical screw on the medial aspect of the distal fragment.  After replacing the guidewire with this in place, the distal fragment was in much better alignment. Sequential reaming was carried out to 11.5 mm and a 10 mm rod was inserted, again the blocking screw helped to maintain near anatomic alignment and on the lateral view, the rod was in essentially the center part of the distal fragment. At this point a dynamic locking screw was inserted proximally through the drill guide, drilling, measuring and placing the locking screw.  The proximal insertion handle was removed and a 5 mm end cap was placed to help minimize any bleeding as well as if there is subsequent need for hardware removal. Using a freehand technique, the 2 distal interlocking medial to lateral screws were inserted drilling, measuring and placing the screws.  There appeared to be near anatomic alignment on both AP and lateral images.   The wounds were thoroughly irrigated.  The patellar tendon repaired after placement of a deep drain with #1 Vicryl, 2-0 Vicryl subcutaneously and skin staples.  The skin staples were small incisions for the interlocking screws and the anterior to posterior blocking screw.  The wound was dressed with Xeroform, 4 x 4's, ABD, Webril and Ace wrap. The patient was sent to the recovery room in stable condition.   ESTIMATED BLOOD LOSS: 50 mL.   COMPLICATIONS: None.   SPECIMEN: None.   TOURNIQUET TIME:  59 minutes at 300 mmHg.  IMPLANTS:  10 x 345 mm tibial male EX from [employer] with interlocking screws and 3.5 mm anterior to posterior cortical screw.      ____________________________ Laurene Footman, MD mjm:ct D: 05/06/2012 06:31:00 ET T: 05/06/2012 13:24:51 ET JOB#: [id number]  cc: Laurene Footman, MD, <Dictator> Laurene Footman MD ELECTRONICALLY SIGNED 05/10/2012 16:51
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
## 2014-05-10 NOTE — Op Note (Signed)
PATIENT NAME:  Gregory Turner, CLINGERMAN MR#:  [id number] DATE OF BIRTH:  [birth year]  DATE OF PROCEDURE:  05/05/2012  PREOPERATIVE DIAGNOSIS: Right distal tibia fracture.  POSTOPERATIVE DIAGNOSIS:   Right distal tibia fracture.  PROCEDURE: Open reduction internal fixation with closed tibial nailing, right tibia.   SURGEON: Laurene Footman, MD  ANESTHESIA:  General.  DESCRIPTION OF PROCEDURE: The patient was brought to the Operating Room and after adequate anesthesia was obtained, the right leg was prepped and draped in the usual sterile fashion with a tourniquet applied to the upper thigh. After appropriate patient identification and timeout procedures were completed, the proximal incision was made medial to the patellar tendon and retractors placed. Then a start hole was made and the guidewire inserted down the canal.  The distal fragment wanted to displace laterally and so a medial blocking screw was inserted with a small stab incision, drilling and then placing a 3.5 mm cortical screw on the medial aspect of the distal fragment.  After replacing the guidewire with this in place, the distal fragment was in much better alignment. Sequential reaming was carried out to 11.5 mm and a 10 mm rod was inserted, again the blocking screw helped to maintain near anatomic alignment and on the lateral view, the rod was in essentially the center part of the distal fragment. At this point a dynamic locking screw was inserted proximally through the drill guide, drilling, measuring and placing the locking screw.  The proximal insertion handle was removed and a 5 mm end cap was placed to help minimize any bleeding as well as if there is subsequent need for hardware removal. Using a freehand technique, the 2 distal interlocking medial to lateral screws were inserted drilling, measuring and placing the screws.  There appeared to be near anatomic alignment on both AP and lateral images.   The wounds were thoroughly irrigated.  The patellar tendon repaired after placement of a deep drain with #1 Vicryl, 2-0 Vicryl subcutaneously and skin staples.  The skin staples were small incisions for the interlocking screws and the anterior to posterior blocking screw.  The wound was dressed with Xeroform, 4 x 4's, ABD, Webril and Ace wrap. The patient was sent to the recovery room in stable condition.   ESTIMATED BLOOD LOSS: 50 mL.   COMPLICATIONS: None.   SPECIMEN: None.   TOURNIQUET TIME:  59 minutes at 300 mmHg.  IMPLANTS:  10 x 345 mm tibial male EX from [employer] with interlocking screws and 3.5 mm anterior to posterior cortical screw.      ____________________________ Laurene Footman, MD mjm:ct D: 05/06/2012 06:31:00 ET T: 05/06/2012 13:24:51 ET JOB#: [id number]  cc: Laurene Footman, MD, <Dictator>

## 2014-05-10 NOTE — Consult Note (Signed)
General Aspect Gregory Turner is a very pleasant 74 year old gentleman with a history of aortic valve replacement in May 1985, AAA with stent graft, long history of smoking who continues to smoke one pack per day, hypertension,  noted to be in atrial fibrillation in early March 2013, Amiodarone was started on his previous clinic visit for pharmacologic cardioversion. 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  Dog was running and hit his leg. He fell, also hurt his right shoulder and arm. Sheduled for leg surgery this AM.   Otherwise he has been doing well. He has had his aortic endograft stenting done in [facility] by Dr. Scot Dock with good success. This was measured at least 5.9 cm.   He initially converted to atrial fibrillation 03/17/2011.  He continues to have back and sciatic pain. He is sedentary. He has retired from work, now has more short of breath, less energy. By his report, is not doing very much of anything. He reports that he is "pacing himself." Denies any significant chest pain.  Previous echocardiogram showed normal LV function, mildly dilated left atrium, normal functioning aortic valve mechanical valve.  Incidentally, infrarenal abdominal aortic aneurysm was found estimated at 5.9 cm. He has a strong family history of aneurysm, with a brother who had AAA rupture, mother with AAA. Most of the family smokes.   Physical Exam:  GEN well developed, well nourished, no acute distress   HEENT pink conjunctivae   NECK supple   RESP normal resp effort  rhonchi  decreased BS mildly throughout   CARD Regular rate and rhythm  Bradycardic   ABD denies tenderness  soft   LYMPH negative neck   EXTR negative edema, right leg in spint   SKIN normal to palpation   NEURO motor/sensory function intact   PSYCH alert, A+O to time, place, person,  good insight   Review of Systems:  Subjective/Chief Complaint Leg and right arm pain   General: Weakness   Skin: No Complaints   ENT: No Complaints   Eyes: No Complaints   Neck: No Complaints   Respiratory: cough, chronic   Cardiovascular: No Complaints   Gastrointestinal: No Complaints   Genitourinary: No Complaints   Vascular: No Complaints   Musculoskeletal: leg and arm pain   Neurologic: No Complaints   Hematologic: No Complaints   Endocrine: No Complaints   Psychiatric: No Complaints   Review of Systems: All other systems were reviewed and found to be negative   Medications/Allergies Reviewed Medications/Allergies reviewed     htn:    Hernia:    stent aortic aneurysm:    Valve Replacement: Aortic, 1985  Home Medications: Medication Instructions Status  amiodarone 200 mg oral tablet 1 tab(s) orally once a day (in the morning) Active  losartan 100 mg oral tablet 1 tab(s) orally once a day (at bedtime) Active  metoprolol tartrate 25 mg oral tablet 0.5 tab(s) orally 2 times a day Active  warfarin 6 mg oral tablet 1 tab(s) orally every other day (in the evening) alternating with 0.5 tablet every other day Active   Lab Results: Hepatic:  17-Apr-14 10:40   Bilirubin, Total 0.6  Alkaline Phosphatase 82  SGPT (ALT) 22  SGOT (AST)  38  Total Protein, Serum 6.8  Albumin, Serum 3.6  Routine Chem:  17-Apr-14 10:40   Glucose, Serum 99  BUN 17  Creatinine (comp) 1.06  Sodium, Serum 138  Potassium, Serum 4.4  Chloride, Serum  109  CO2, Serum 24  Calcium (Total), Serum  8.4  Osmolality (calc) 277  eGFR (African American) >60  eGFR (Non-African American) >60 (eGFR values <67m/min/1.73 m2 may be an indication of chronic kidney disease (CKD). Calculated eGFR is useful in patients with stable renal function. The eGFR calculation will not be reliable in acutely ill patients when serum creatinine is changing rapidly. It is not useful in  patients on  dialysis. The eGFR calculation may not be applicable to patients at the low and high extremes of body sizes, pregnant women, and vegetarians.)  Anion Gap  5  Routine Coag:  17-Apr-14 10:40   Prothrombin  21.3  INR 1.9 (INR reference interval applies to patients on anticoagulant therapy. A single INR therapeutic range for coumarins is not optimal for all indications; however, the suggested range for most indications is 2.0 - 3.0. Exceptions to the INR Reference Range may include: Prosthetic heart valves, acute myocardial infarction, prevention of myocardial infarction, and combinations of aspirin and anticoagulant. The need for a higher or lower target INR must be assessed individually. Reference: The Pharmacology and Management of the Vitamin K  antagonists: the seventh ACCP Conference on Antithrombotic and Thrombolytic Therapy. CJEHUD.1497Sept:126 (3suppl): [id number] A HCT value >55% may artifactually increase the PT.  In one study,  the increase was an average of 25%. Reference:  "Effect on Routine and Special Coagulation Testing Values of Citrate Anticoagulant Adjustment in Patients with High HCT Values." American Journal of Clinical Pathology 2006;126:400-405.)  Routine Hem:  17-Apr-14 10:40   WBC (CBC) 9.2  RBC (CBC) 4.71  Hemoglobin (CBC) 15.1  Hematocrit (CBC) 44.7  Platelet Count (CBC) 166 (Result(s) reported on 04 May 2012 at 11:38AM.)  MCV 95  MCH 32.0  MCHC 33.7  RDW 14.5   EKG:  Interpretation EKG shows normal sinus rhythm with rate 50 beats per minute, no significant ST or T wave changes EKG shows sinus bradycardia rate 47 bpm   Radiology Results: XRay:    17-Apr-14 11:30, Tibia And Fibula Right  Tibia And Fibula Right   REASON FOR EXAM:    s/p fall  COMMENTS:       PROCEDURE: DXR - DXR TIBIA AND FIBULA RT (LOWER L  - May 04 2012 11:30AM     RESULT: And there is fracture in the distal third of the right tibia with   slight distraction and lateral  displacement of the distal portion. There   is a spiral appearance of the fracture. There may be a fracture in the   proximal right fibula which is poorly demonstrated.    IMPRESSION:  Distal right tibial and proximal right fibular fractures as   described.    Dictation Site: 2    Verified By: GSundra Aland M.D., MD    Sulfa drugs: Hives  Vital Signs/Nurse's Notes: **Vital Signs.:   18-Apr-14 05:28  Vital Signs Type Routine  Temperature Temperature (F) 98.8  Celsius 37.1  Temperature Source oral  Pulse Pulse 57  Respirations Respirations 18  Systolic BP Systolic BP 1026 Diastolic BP (mmHg) Diastolic BP (mmHg) 78  Mean BP 94  Pulse Ox % Pulse Ox % 92  Pulse Ox Activity Level  At rest  Oxygen Delivery Room Air/ 21 %    Impression Mr. EGustavus Haskinis a very pleasant 74year old gentleman with a history of aortic valve replacement in May 1985, AAA with stent graft, long history of  smoking who continues to smoke one pack per day, hypertension,  noted to be in atrial fibrillation in early March 2013, Amiodarone was started on his previous clinic visit for pharmacologic cardioversion. 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  1) Pre op eval Acceptable risk, --would minimize IVF as he has moderate COPD, high risk of arrhythmia with excess IVF through preop and periop period. --Hold metoprolol for rate less than 55 bpm --No further testing needed.  2) Mechanical Aortic valve would maintain INR >2  (2 to 3 is ideal)  3) Tibial fx/radius surgery today  4) COPD/ long hx of smoking If he develops SOB, would hold IVF, nebs If no relief, given lasix 20 IV x 1  5) AAA with stent graft  6) Smoking patch in place   Electronic Signatures: Ida Rogue (MD)  (Signed 18-Apr-14 08:08)  Authored: General Aspect/Present Illness, History and  Physical Exam, Review of System, Past Medical History, Home Medications, Labs, EKG , Radiology, Allergies, Vital Signs/Nurse's Notes, Impression/Plan   Last Updated: 18-Apr-14 08:08 by Ida Rogue (MD)

## 2014-05-10 NOTE — Discharge Summary (Signed)
PATIENT NAME:  Gregory Turner, DILORENZO MR#:  [id number] DATE OF BIRTH:  1940-08-30  DATE OF ADMISSION:  05/04/2012 DATE OF DISCHARGE:  05/08/2012  ADMITTING DIAGNOSIS: Right tibia fracture.   DISCHARGE DIAGNOSIS: Right tibia fracture.   OPERATION: On 05/05/2012 the patient had a right tibial nailing that was closed.   ANESTHESIA: The patient had general anesthesia.   ESTIMATED BLOOD LOSS: 50 mL.   TOURNIQUET TIME: Was 58 minutes at 300 mmHg.   DRAINS: Hemovac.   IMPLANTS: Synthes tibial EX nail and interlocking screws.   COMPLICATIONS: None.  The patient was stabilized, brought to the recovery room and then brought down to the orthopedic floor.   HISTORY AND PHYSICAL: The patient is a 74 year old male who presented after a fall in his yard. The patient was brought to the Emergency Room by EMS where x-rays revealed a spiral distal tibial fracture and a spiral proximal fibular fracture, with displacement. The patient has a history of a prior aortic valve replacement with chronic anticoagulation, and is on Coumadin. The patient has abdominal aortic aneurysm stents as well.   PHYSICAL EXAMINATION: GENERAL: Male, in mild discomfort.  LUNGS: Clear.  HEART: Regular rate and rhythm, with mechanical click with aortic valve.  MUSCULOSKELETAL: In regard to the right lower extremity, there is external rotation noted. Skin is intact. The patient is able to move the toes. The patient does have sensation, is intact.   HOSPITAL COURSE: After initial admission on 05/04/2012, the patient was brought to the orthopedic floor, and then the operating room the following day. On postoperative day 1 the patient ambulated with physical therapy, bed to chair, and progressed up to ambulating about  25 to 30 feet with physical therapy. The patient did well postoperatively. The patient did have his Hemovac removed after day 2, and dressing change on day 3. The patient was ready to go home on 05/08/2012 with no home  health physical therapy.   CONDITION AT DISCHARGE: Stable.   DISPOSITION: The patient was sent home.   DISCHARGE INSTRUCTIONS: 1.  The patient will elevate the leg with 1 to 2 pillows. The patient is right toe-touch weightbearing on the affected leg.  2.  The patient's diet is regular.  3.  The patient will keep his dressing clean and try not to get it wet. The patient will leave the dressing on at this time. The patient will call the clinic if there is any bright red bleeding or calf pain, or bowel or bladder difficulty. The patient will call for any fever greater than 101.5.  4.  The patient will follow up in 10 days to the [facility].   DISCHARGE MEDICATIONS: Are to resume home medications: Warfarin 6 mg every other day, and one-half tablet every other day, hydrocodone/acetaminophen 5/325 one tablet every 4 to 6 hours p.r.n. for pain, and ferrous sulfate 325 mg 1 tablet b.i.d.    ____________________________ J. Reche Dixon, [place] jtm:dm D: 05/08/2012 07:59:56 ET T: 05/08/2012 08:25:50 ET JOB#: [id number]  cc: J. Reche Dixon, [place], <Dictator> J Linzey Ramser [facility] PA ELECTRONICALLY SIGNED 05/13/2012 6:34
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
## 2014-05-10 NOTE — Consult Note (Signed)
Sulfa drugs: Hives    Impression 1. SBO 2. hx AVR on coumadin 3. wheezing on exam with hx smoking 4. hx a fib now is nsr 5 HTN 6. tobacco dependence    Plan 1. hold coumadin start heparin gtt hold aleast 6 hrs prior to surgery 2. mod risk for procedure 3. metoprol and hydralazine PRN, pt NPO so hold amio 4. start steroids for wheezing and inhlaers 5. INR adn ABD xray in am 6, further managment i.e NGT per surgery   thank you will follow   Electronic Signatures: Bettey Costa (MD)  (Signed 24-Jan-14 16:49)  Authored: Home Medications, Allergies, Impression/Plan   Last Updated: 24-Jan-14 16:49 by Bettey Costa (MD)

## 2014-05-10 NOTE — Op Note (Signed)
PATIENT NAME:  Gregory Turner, Gregory Turner MR#:  [id number] DATE OF BIRTH:  07-24-1940  DATE OF PROCEDURE:  03/10/2012  REASON FOR SURGERY: Recurrent right inguinal hernia repair. History of incarceration.   POSTOPERATIVE DIAGNOSIS: Recurrent right inguinal hernia repair. History of incarceration.  PROCEDURE PERFORMED: Open right inguinal hernia repair with plug and patch.   ESTIMATED BLOOD LOSS: 25 mL.   ANESTHESIA: General.   COMPLICATIONS: None.   IMPLANTS: A 19-French flat JP drain.   INDICATION FOR SURGERY: The patient is a pleasant 74 year old male with a history of right inguinal hernia repair x 2. He presented a few weeks back with a bowel obstruction from a hernia which ended up being reduced. He presented for a inguinal hernia repair.   DETAILS OF PROCEDURE ARE AS FOLLOWS: Informed consent was obtained. The patient was brought to the Operating Room suite. He was induced, an endotracheal tube was placed, general anesthesia was administered. The abdomen was prepped and draped in standard surgical fashion. A transverse incision was made approximately one fingerbreadth above the pubic symphysis on the right side with the lateral-most aspect of the inguinal ligament. This is dissected down through the subcutaneous tissue to the aponeurosis and external abdominal oblique. I cleared up the abdominal oblique. I incised it. There appeared to be mesh which has been adhered to the aponeurosis, and therefore I opened it carefully, ensuring that I would not go across any bowel. I then opened the underside. I cleared off the conjoined tendon as well as exposed the shelving edge of inguinal ligament. I then isolated the cord. There was a large indirect hernia, which I dissected out as well as a cord lipoma and reduced. There was a large internal ring. I placed a mesh plug in the internal ring and sutured it to the edges of the ring with interrupted 0 Ethibond sutures. I then cleared out the floor. There was a  piece of mesh which appeared to be wadded up around the pubic tubercle. This was partially debrided so that I could get a nice bite of the pubic tubercle and run the mesh both ways up to the shelving edge of inguinal ligament as well as the conjoined tendon. I then proceeded to fix a keyhole mesh to the periosteum of the pubic symphysis and the pubic tubercle using a 0 Ethibond U-stitch. The mesh was fixed medially with interrupted 0 Ethibond U-stitches and then ran laterally with interrupted Ethibond stitches putting the mesh in the shelving edge of inguinal ligament. The keyhole was reapproximated, so that it fit the cord structures and would fit the tip of a Kelly clamp. I closed the mesh keyhole by approximating the tails using an interrupted 0 Ethibond U-stitch in a vest-over-pants method. I then placed another piece of keyhole mesh not containing the keyhole around the superior aspect of the inguinal canal so that I was able to cover the keyhole mesh to provide structure. I then attached it to the shelving edge of the inguinal ligament with interrupted sutures laterally, the conjoined tendon medially and at the apex of the inguinal canal. I then also sutured it to the previously placed mesh. I then irrigated the inguinal canal and the hernia sac, which I had reduced, was quite large and left a significant defect inferior to the repair and into the scrotum. I therefore placed a 15-French JP drain as the patient will be on anticoagulation soon to prevent hematoma formation. I sutured it in place with a 3-0 nylon. I then reapproximated the  aponeurosis of the external abdominal oblique as well as I could. It sutured well because of the previously placed mesh. I then closed that using a running 3-0 Vicryl. I then closed Scarpa's fascia with a running 3-0 Vicryl. I then closed the dermis with a running 3-0 Vicryl and the skin I closed with suture staples. A sterile dressing was placed over the wound. The patient was  then awoken, extubated and brought to the Postanesthesia Care Unit. There were no immediate complications. Needle, sponge, and instrument counts were correct at the end of the procedure.   ____________________________ Glena Norfolk. Camya Haydon, MD cal:jm D: 03/10/2012 20:08:52 ET T: 03/11/2012 13:30:42 ET JOB#: [id number]  cc: Harrell Gave A. Jamarria Real, MD, <Dictator> Floyde Parkins MD ELECTRONICALLY SIGNED 03/14/2012 8:16

## 2014-05-10 NOTE — Consult Note (Signed)
PATIENT NAME:  Gregory Turner, Gregory Turner MR#:  [id number] DATE OF BIRTH:  1940/10/30  DATE OF CONSULTATION:  02/11/2012  REFERRING PHYSICIAN:  Jerrol Banana. Burt Knack, MD CONSULTING PHYSICIAN:  Leyani Gargus P. Benjie Karvonen, MD  PRIMARY CARE PHYSICIAN: Leona Carry. Hall Busing, MD  PRIMARY CARDIOLOGIST: Minna Merritts, MD  REASON FOR CONSULTATION: Medical management for possible surgery.   IMPRESSION:  1.  Small bowel obstruction with reducible umbilical hernia.  2.  History of aortic valve replacement, on Coumadin.  3.  Wheezing on exam with history of smoking.  4.  History of atrial fibrillation, in normal sinus rhythm.  5.  History of hypertension.  6.  Tobacco dependence.  7.  Renal insufficiency.  PLAN:  1.  Hold Coumadin. Start heparin drip. Will need to hold heparin at least 6 hours prior to surgery.  2.  Moderate risk for procedure. May proceed without further cardiac workup.  3.  Metoprolol and hydralazine p.r.n. for hypertension with parameters. The patient is n.p.o. so all other medications are on hold, including amiodarone.  4.  Start steroids for wheezing and inhalers.  5.  Check INR and abdominal x-ray in the a.m. Further management, i.e. NG tube, per surgery.  6.  The patient will receive IV fluids and we will monitor his BMP for the a.m.  7.  The patient does not want to quit smoking. I have placed a nicotine patch. The patient was counseled for 3 minutes.  HISTORY OF PRESENT ILLNESS: This is a very pleasant 74 year old male with a history of aortic valve replacement and stent in his aortic aneurysm as well as hypertension who presents with abdominal pain and distention for the past 1 night. He also has nausea and vomiting. A CT scan was performed which showed small bowel obstruction. Hospitalist is consulted for medical management. The patient's INR is 2.8. The patient has no other complaints except for abdominal pain currently. Morphine is helping with abdominal pain.   REVIEW OF SYSTEMS:   CONSTITUTIONAL: No fever, fatigue, weakness.  EYES: No blurred or double vision. He does wear glasses.  ENT: No ear pain, hearing loss, seasonal allergies. Occasional snoring.  RESPIRATORY: No cough. He is wheezing on physical exam. No hemoptysis or dyspnea. He was recently treated for bronchitis.  CARDIOVASCULAR: No chest pain, orthopnea, edema, arrhythmia, dyspnea on exertion, palpitations.  GASTROINTESTINAL: Positive nausea and vomiting. No diarrhea. Positive abdominal pain. No melena or ulcers.  GENITOURINARY: No dysuria or hematuria.  ENDOCRINE: No polyuria or polydipsia.  HEMATOLOGIC: No anemia or easy bruising. SKIN: No rash or lesions. MUSCULOSKELETAL: No pain in his shoulders. No limited activity. NEUROLOGIC: No history of CVA, TIA, seizures.  PSYCHIATRIC: No history of anxiety or depression.   PAST MEDICAL HISTORY:  1.  Hypertension.  2.  Aortic valve replacement.  3.  History of atrial fibrillation, has been in normal sinus rhythm since surgery on his aorta. 4.  History of aortic aneurysm.   PAST SURGICAL HISTORY:  1.  Stent in his aortic aneurysm.  2.  Aortic valve replacement.   MEDICATIONS:  1.  Coumadin 6 mg every other day.  2.  Tylenol Extra-Strength q.6 h. 1 tablet p.r.n. pain.  3.  Metoprolol 25, 1/2 tablet b.i.d.  4.  Losartan 100 mg daily.  5.  Amiodarone 200 mg daily.   ALLERGIES: SULFA CAUSES HIVES.   SOCIAL HISTORY: Smoking history: The patient says he smokes 1 pack a day. He is not in quitting. No IV drug use or alcohol.   FAMILY HISTORY:  Positive for diabetes, hypertension and CAD.   PHYSICAL EXAMINATION:  VITAL SIGNS: Temperature 97.7, pulse 56, respirations 20, blood pressure 173/104, 96% on room air.  GENERAL: The patient is alert, oriented, appears to be in mild distress from his abdominal pain.  HEENT: Head is atraumatic. Pupils are round and reactive. Sclerae anicteric. Mucous membranes are moist. Oropharynx is clear.  NECK: Supple without  JVD, carotid bruit or enlarged thyroid.  CARDIOVASCULAR: Regular rate and rhythm. No murmurs, gallops or rubs. PMI is not displaced.  LUNGS: He has some mild wheezing bilaterally with fair air movement. No crackles or rales are heard. No dullness to percussion. Normal respiratory effort.  ABDOMEN: Distended with hypoactive bowel sounds. No rebound or guarding. The patient has a right inguinal hernia that is reducible.  EXTREMITIES: No clubbing, cyanosis or edema.  NEUROLOGIC: Cranial nerves II through XII are intact. No focal deficits.  SKIN: Without rash or lesions.   LABORATORY, DIAGNOSTIC AND RADIOLOGIC DATA: INR is 2.8. White blood cells 9.9, hemoglobin 17, hematocrit 50.3, platelets 182. Sodium 138, potassium 4.9, chloride 105, bicarbonate 28, BUN 15, creatinine 1.34, glucose 124, calcium 9.6, bilirubin 0.9, alkaline phosphatase 101, ALT 23, AST 30, total protein 8.8, albumin 4.2, lipase 190. CT of the abdomen and pelvis shows high-grade small bowel obstruction caused by right inguinal hernia. EKG: Sinus bradycardia with no ST elevations or depressions.   Thank you for allowing [place] to participate in the care of this patient. We will continue to follow.  TIME SPENT: Approximately 40 minutes.   ____________________________ Donell Beers. Benjie Karvonen, MD spm:jm D: 02/11/2012 16:55:32 ET T: 02/11/2012 20:50:59 ET JOB#: [id number]  cc: Leilan Bochenek P. Benjie Karvonen, MD, <Dictator> Leona Carry. Hall Busing, MD Minna Merritts, MD Jerrol Banana. Burt Knack, MD Ulice Bold P Harlyn Italiano MD ELECTRONICALLY SIGNED 02/13/2012 22:03

## 2014-05-10 NOTE — H&P (Signed)
PATIENT NAME:  Gregory Turner, Gregory Turner MR#:  [id number] DATE OF BIRTH:  19-Jul-1940  DATE OF ADMISSION:  05/04/2012  CHIEF COMPLAINT: Right leg pain.   HISTORY OF PRESENT ILLNESS: The patient is a 74 year old male who is generally active. He suffered a fall in his yard. He was out feeding his neighbor's dog. His dog jumped up and hit him from behind. He spun around on his right ankle and was unable to bear weight following this, with deformity and pain to the leg. He was brought to the Emergency Room via EMS and  x-rays reveal a spiral distal tibia fracture, spiral proximal fibular fracture, with some displacement. He is being admitted for treatment of this.   His past history is remarkable for prior aortic valve replacement with chronic anticoagulation, hypertension, prior abdominal surgeries for small bowel obstruction, hernia repair, AAA stents.  ALLERGY TO SULFA.   MEDICATIONS ON ADMISSION: Coumadin 6 mg, alternating with 3 mg daily, metoprolol tartrate 12.5 mg b.i.d., losartan 100 mg daily at bedtime, amiodarone 200 mg daily in the morning.   SOCIAL HISTORY: He is retired. Lives with his wife, and again is generally active. He is a  1 pack per day smoker. Denies alcohol use.   FAMILY HISTORY: Positive for diabetes, hypertension, heart disease.   REVIEW OF SYSTEMS: Positive just for right leg pain. He denies shortness of breath, loss of consciousness or other problem related to the injury.   PHYSICAL EXAMINATION: GENERAL: 85 white male who appears his stated age, in mild distress secondary to right leg pain.  HEENT: Unremarkable.  LUNGS: Clear.  HEART: Regular rate and rhythm. There is a mechanical click with his aortic valve.   ABDOMEN: Soft, nontender, with prior surgical scars.  EXTREMITIES: Exam is remarkable for externally-rotated right lower extremity. Skin is intact. He is able to flex and extend the toes. He has a palpable dorsalis pedis pulse. Sensation is intact to the foot.    X-rays reveal spiral fracture, distal tibial.   CLINICAL IMPRESSION: Active male with a spiral distal tibia fracture, on chronic anticoagulation. Will need to continue with anticoagulation with his mechanical heart valve.   Discussed treatment options with him and his wife I think with his history of anticoagulation our options are to go with a long-leg cast, which I think is unlikely to resulted in acceptable functional result as it would take quite some time for this to be adequately healed, with the knee immobilized for such an extended time. With regard to operative treatment I think a subcutaneous plate has significant risk of wound breakdown with his anticoagulation. I think an  intramedullary rod should give acceptable alignment with much less risk for skin complication.   The risks, benefits, and possible complications were discussed. Will admit him today. I just called Dr. Rockey Situ, his cardiologist, who will evaluate, but the patient has not had ischemic heart disease, only valvular disease and will maintain his anticoagulation perioperatively.   I did discuss that he is likely to have some bleeding through the knee incision more than usual, but this should be acceptable, with less risk of wound breakdown at the knees and a larger incision distally over the medial ankle.     ____________________________ Laurene Footman, MD mjm:dm D: 05/04/2012 15:08:37 ET T: 05/04/2012 15:32:26 ET JOB#: [id number]  cc: Laurene Footman, MD, <Dictator> Laurene Footman MD ELECTRONICALLY SIGNED 05/04/2012 16:37

## 2014-05-11 NOTE — Op Note (Signed)
PATIENT NAME:  Gregory Turner, Gregory Turner MR#:  [id number] DATE OF BIRTH:  18-Aug-1940  DATE OF PROCEDURE:  10/09/2013  PREOPERATIVE DIAGNOSIS: Visually significant cataract of the right eye.   POSTOPERATIVE DIAGNOSIS: Visually significant cataract of the right eye.   OPERATIVE PROCEDURE: Cataract extraction by phacoemulsification with implant of intraocular lens to right eye.   SURGEON: Birder Robson, MD  ANESTHESIA:  1. Managed anesthesia care.  2. Topical tetracaine drops followed by 2% Xylocaine jelly applied in the preoperative holding area.   COMPLICATIONS: None.   TECHNIQUE:  Stop and chop.   DESCRIPTION OF PROCEDURE: The patient was examined and consented in the preoperative holding area where the aforementioned topical anesthesia was applied to the right eye and then brought back to the operating room where the right eye was prepped and draped in the usual sterile ophthalmic fashion and a lid speculum was placed. A paracentesis was created with the side port blade and the anterior chamber was filled with viscoelastic. A near clear corneal incision was performed with the steel keratome. A continuous curvilinear capsulorrhexis was performed with a cystotome followed by the capsulorrhexis forceps. Hydrodissection and hydrodelineation were carried out with BSS on a blunt cannula. The lens was removed in a stop-and-chop technique and the remaining cortical material was removed with the irrigation-aspiration handpiece. The capsular bag was inflated with viscoelastic and the Tecnis ZCB00 18.5-diopter lens, serial number [id number] was placed in the capsular bag without complication. The remaining viscoelastic was removed from the eye with the irrigation-aspiration handpiece. The wounds were hydrated. The anterior chamber was flushed with Miostat and the eye was inflated to physiologic pressure; 0.1 mL of cefuroxime concentration 10 mg/mL was placed in the anterior chamber. The wounds were found to be  water tight. The eye was dressed with Vigamox. The patient was given protective glasses to wear throughout the day and a shield with which to sleep tonight. The patient was also given drops with which to begin a drop regimen today and will follow up with me in one day.    ____________________________ Livingston Diones. Samreet Edenfield, MD wlp:ST D: 10/09/2013 21:10:49 ET T: 10/09/2013 22:01:12 ET JOB#: [id number]  cc: Graceson Nichelson L. Hershall Benkert, MD, <Dictator> Livingston Diones Dorian Duval MD ELECTRONICALLY SIGNED 10/10/2013 10:43

## 2014-05-29 IMAGING — CR DG CHEST 2V
2 series · 2 of 2 positions shown · non-contrast
Comparison: CT chest 08/02/2012 and plain film of the chest
06/22/2011.

CLINICAL DATA: History of cardiac surgery. Ascending aortic
aneurysm.

EXAM:
CHEST  2 VIEW

[w chest pa]
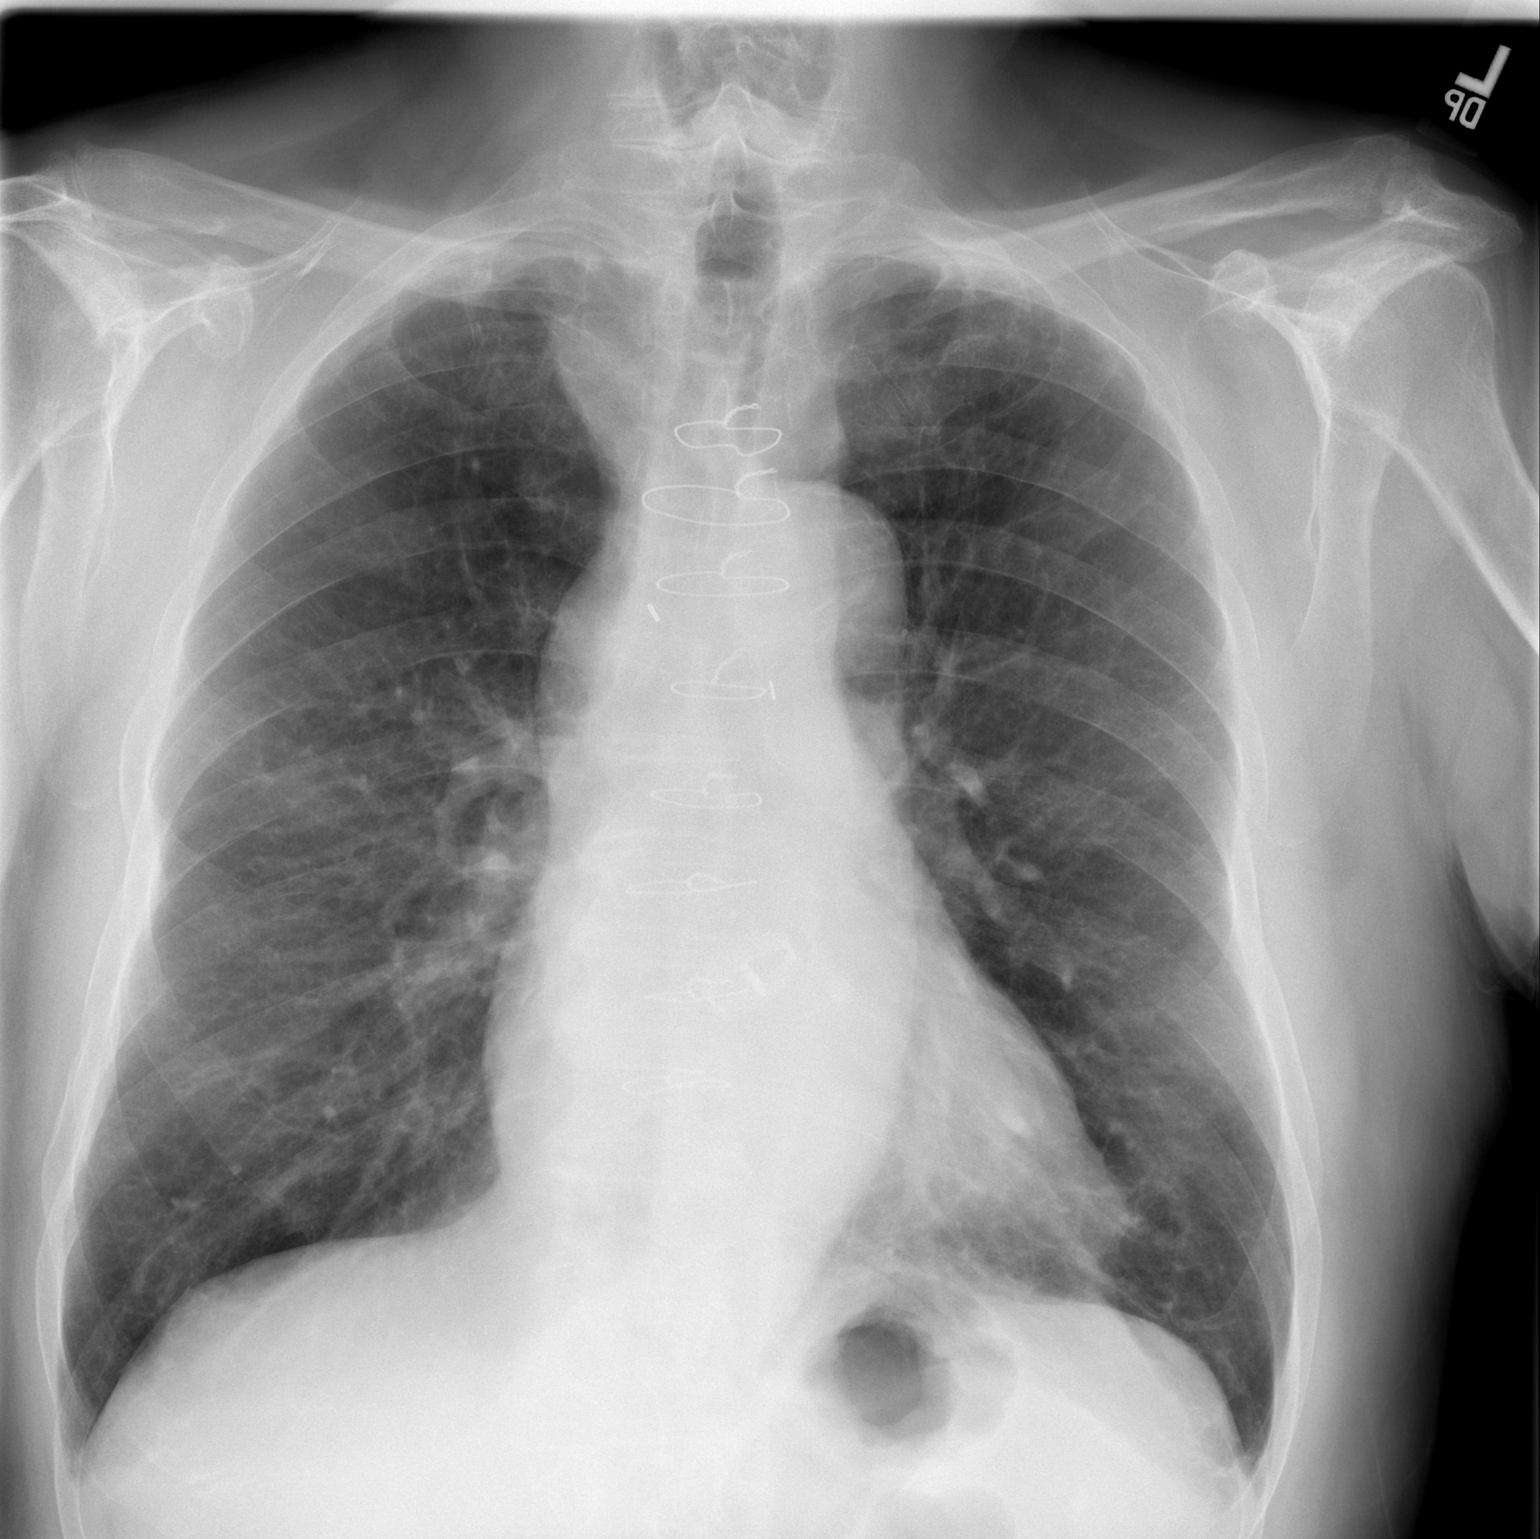

[w chest lat]
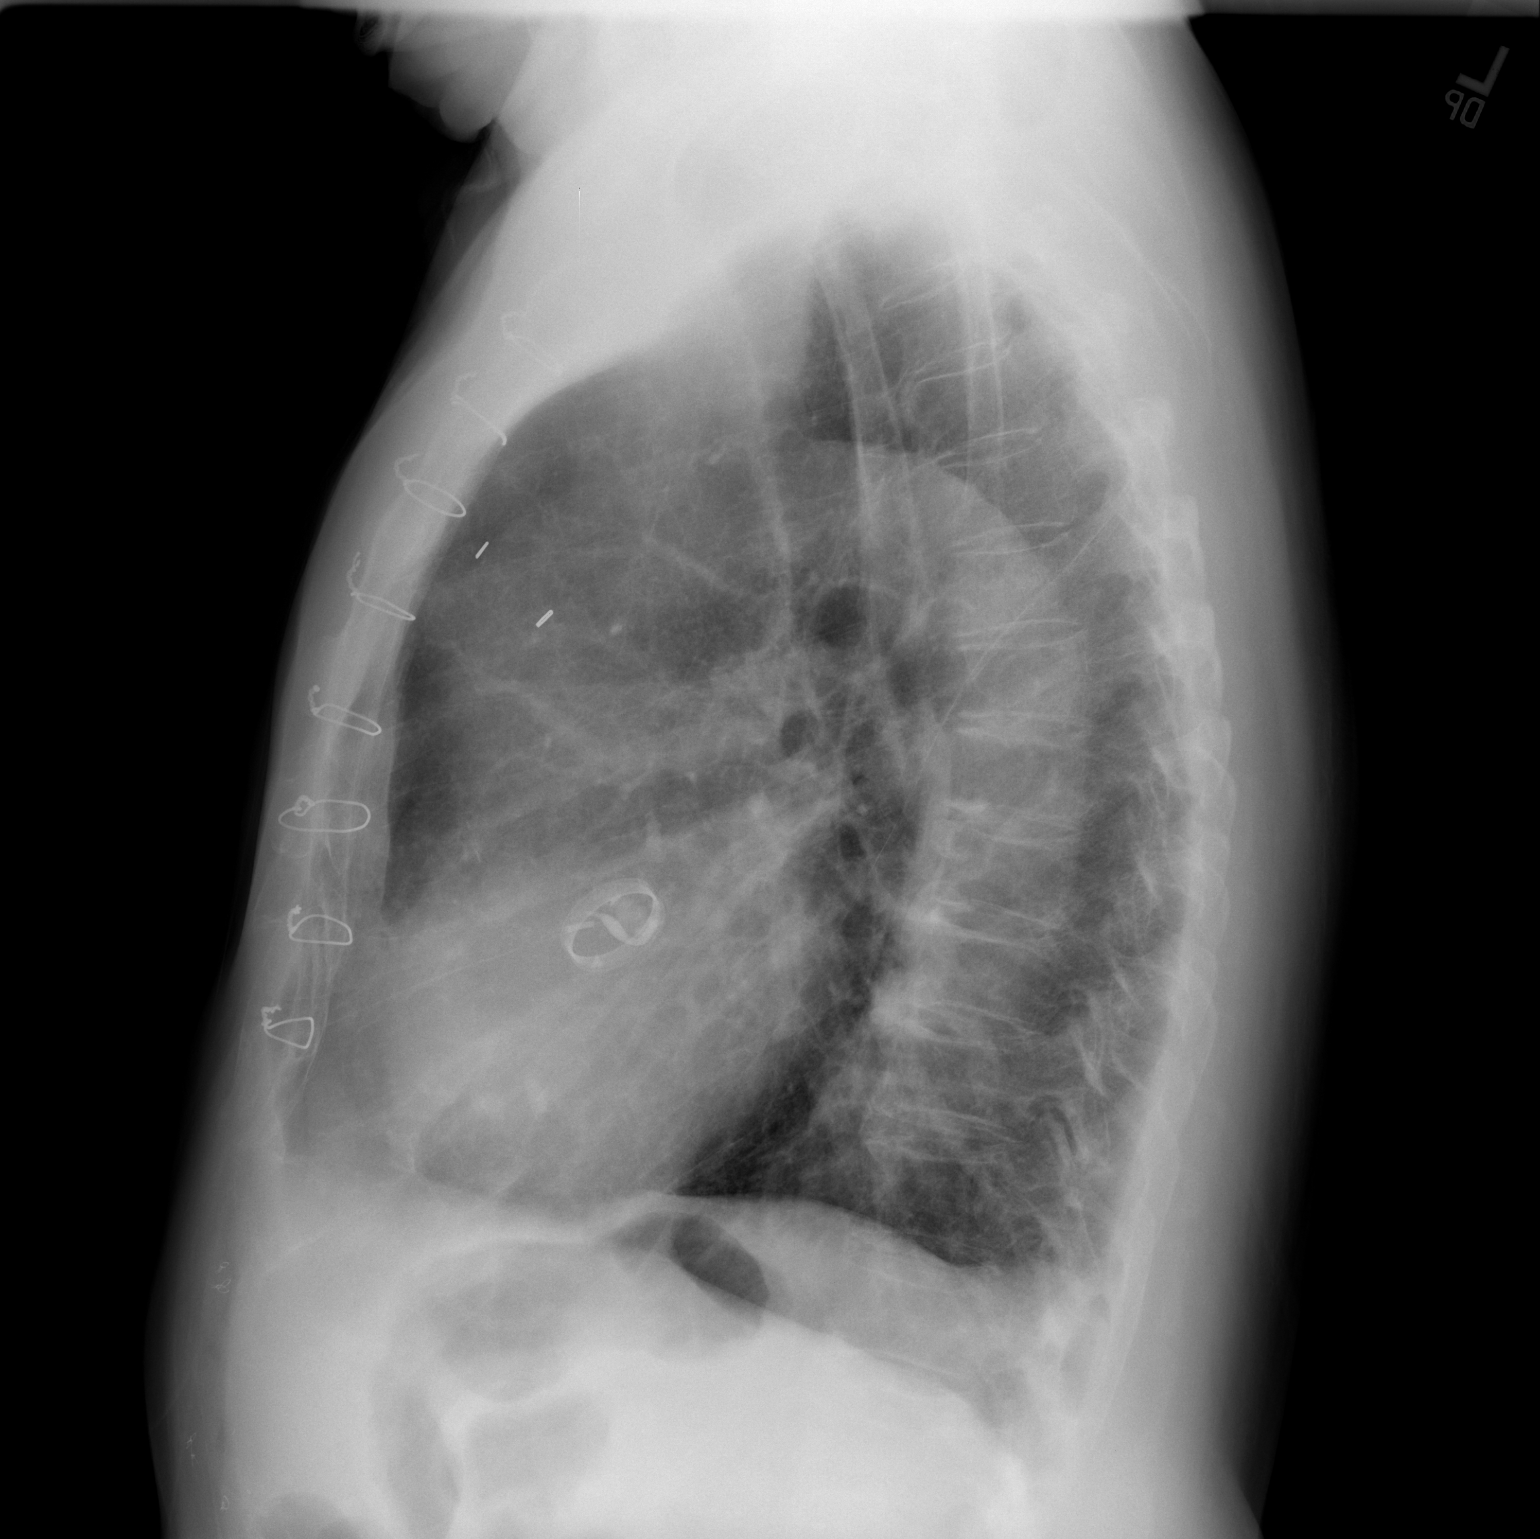

[2 of 2 positions shown; findings below may reference images not displayed]

FINDINGS: Prominence of the ascending aorta appears unchanged compared to the
prior plain film. The patient is status post aortic valve
replacement. The lungs are emphysematous but clear. Heart size is
normal. No pneumothorax or pleural fluid.
IMPRESSION: No acute finding.

No change in appearance of the ascending aorta.

Emphysema.

## 2014-07-01 IMAGING — CR DG CHEST 1V PORT
1 series · 1 of 1 positions shown · non-contrast
Comparison: 11/08/2012

CLINICAL DATA: Status post valve repair

EXAM:
PORTABLE CHEST - 1 VIEW

[AP]
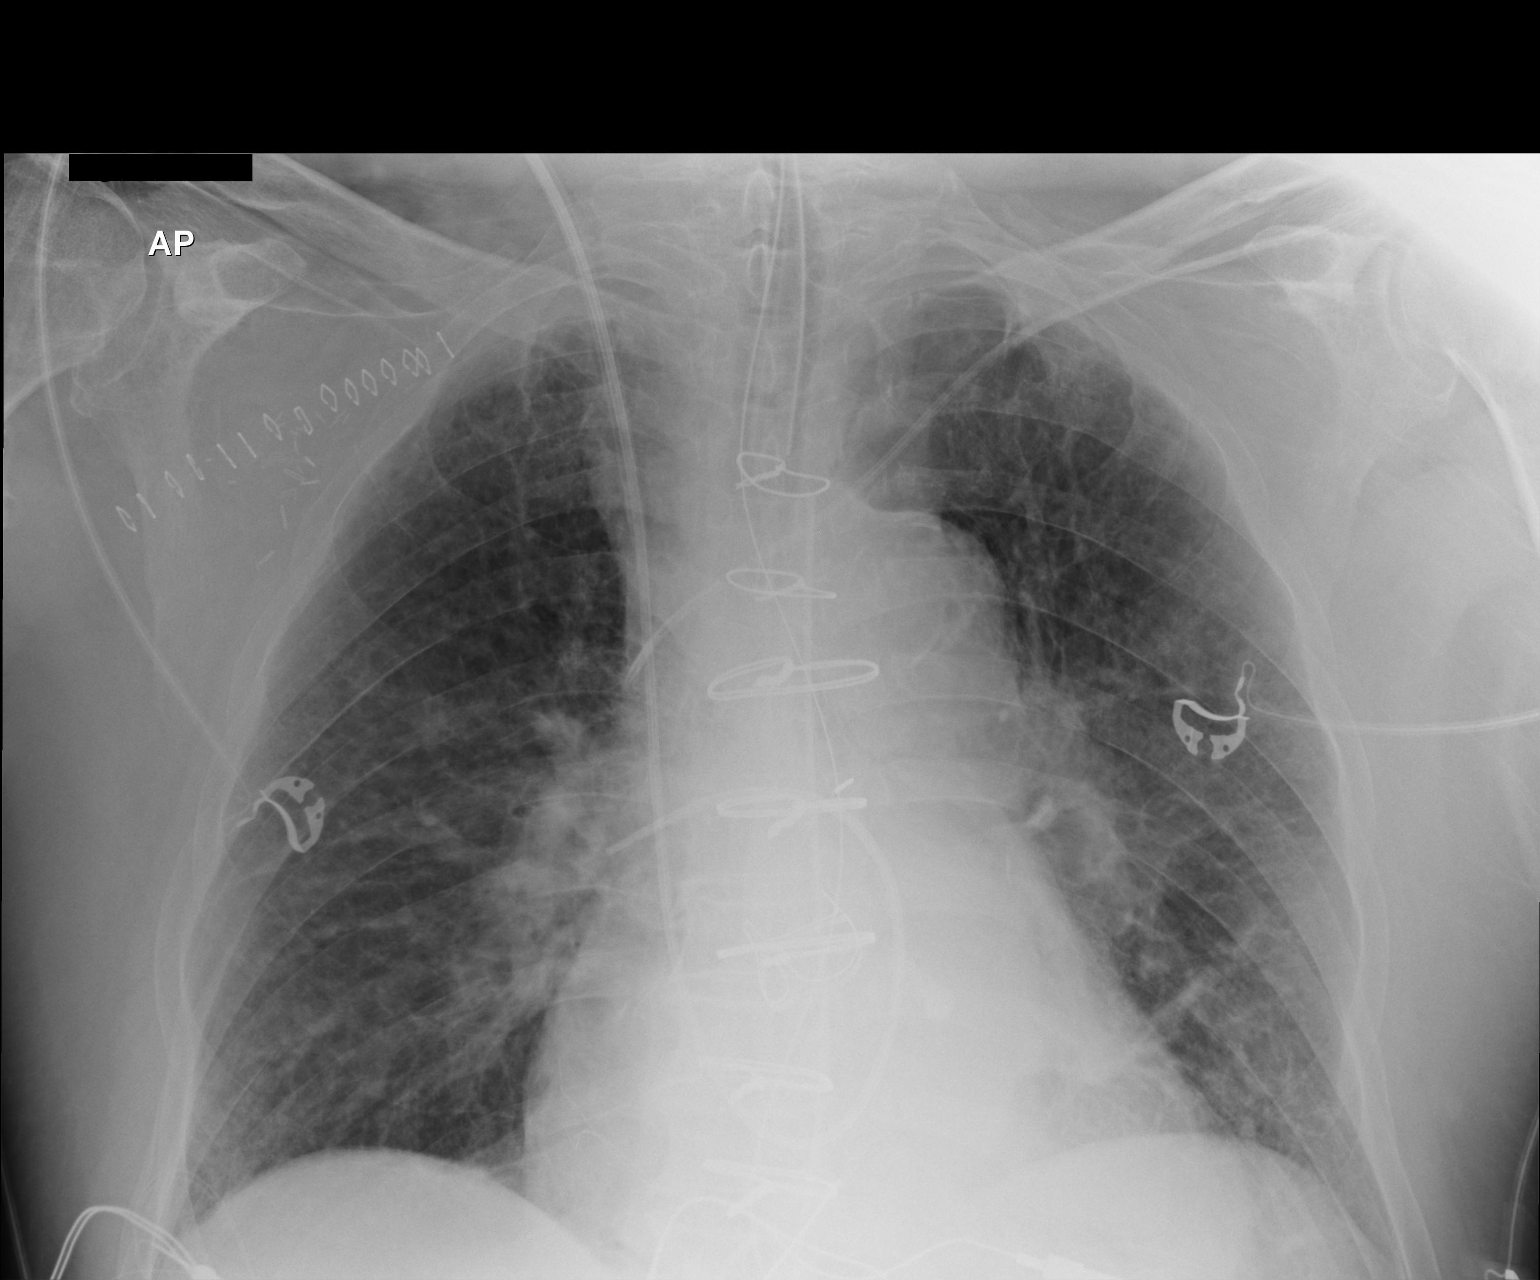

[1 of 1 positions shown; findings below may reference images not displayed]

FINDINGS: Postsurgical changes are again seen. A Swan-Ganz catheter is noted
within the right pulmonary artery. A left subclavian central line is
noted with the tip in the proximal superior vena cava. An
endotracheal tube is seen approximately 5.8 cm above the carina. A
nasogastric catheter is noted coursing below the bottom edge of the
film. Drains are seen at the inferior aspect of the film in the
midline of the mediastinum. The lungs are clear. Postsurgical
changes are also noted over the right subclavicular region. Chronic
interstitial changes and mild interstitial edema are noted.
IMPRESSION: Postsurgical changes with tubes and lines as described above. Mild
interstitial edema is noted.

## 2014-07-02 IMAGING — CR DG CHEST 1V PORT
2 series · 2 of 2 positions shown · non-contrast
Comparison: Portable chest x-ray of 12/11/2012

CLINICAL DATA: Postop, followup

EXAM:
PORTABLE CHEST - 1 VIEW

[AP (1 of 2)]
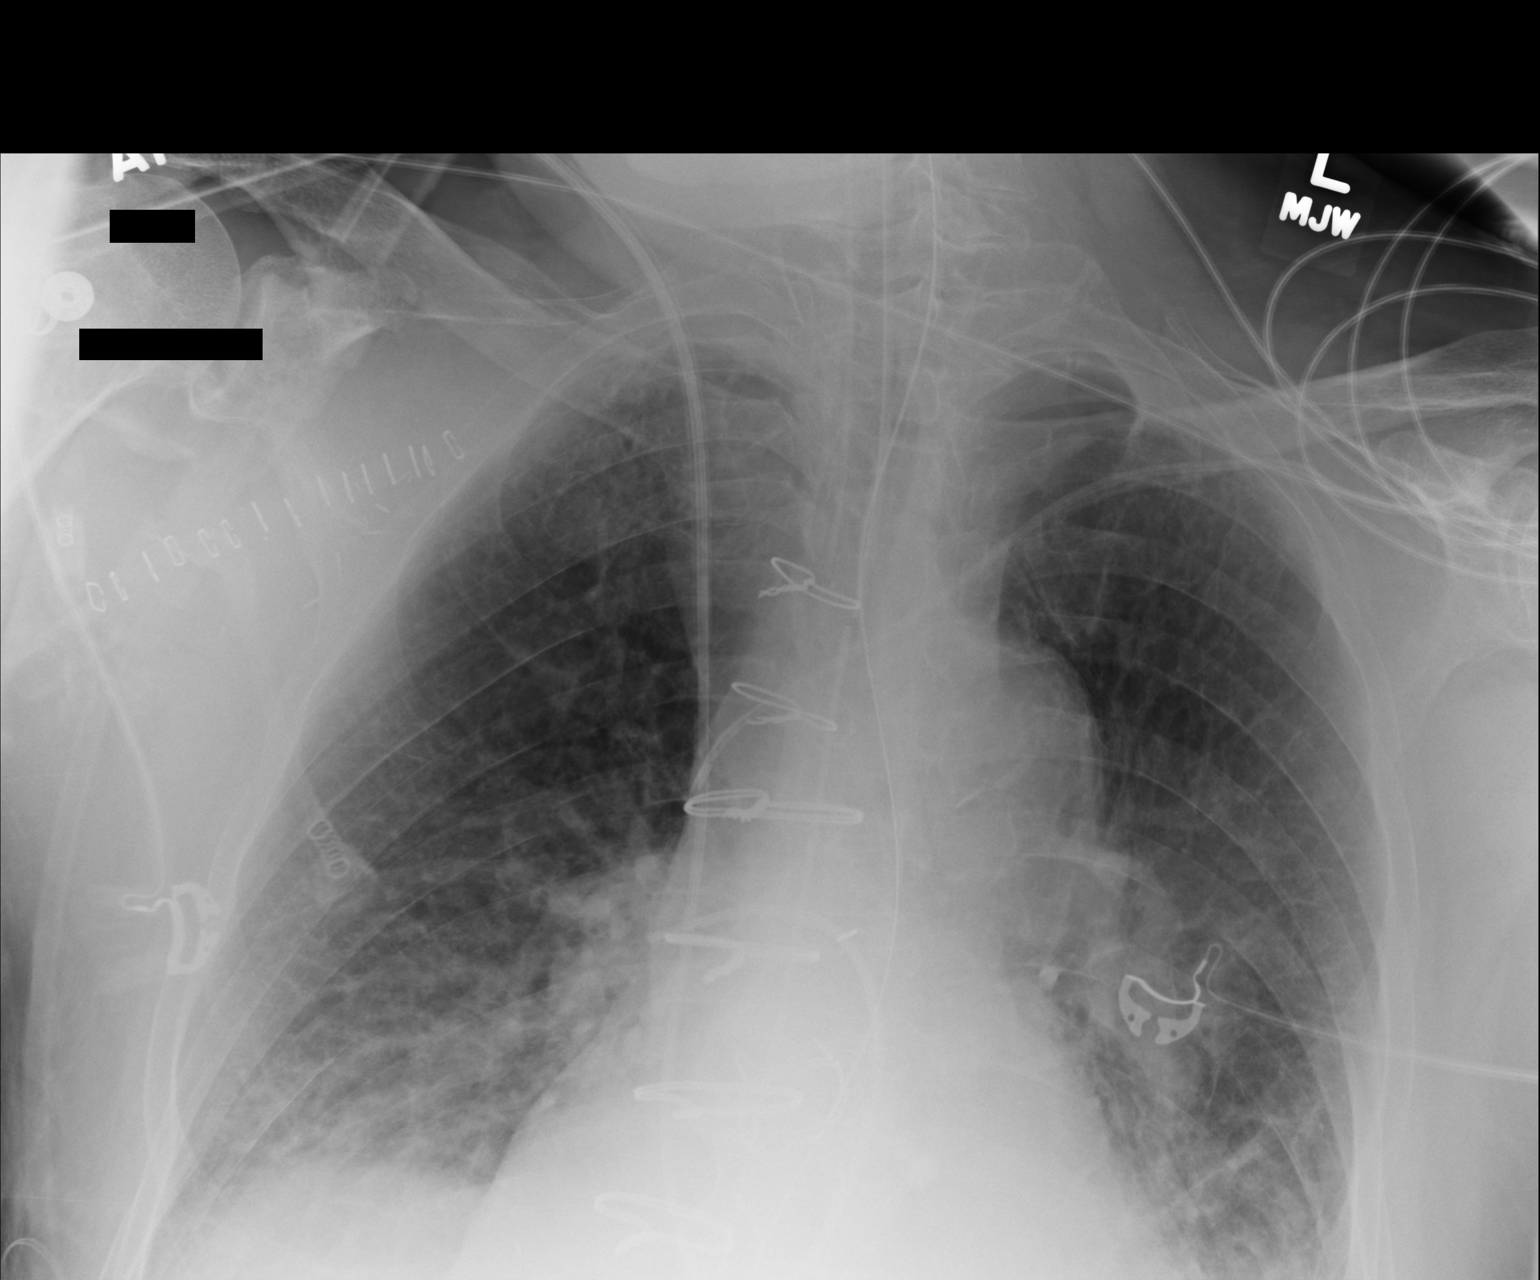

[AP (2 of 2)]
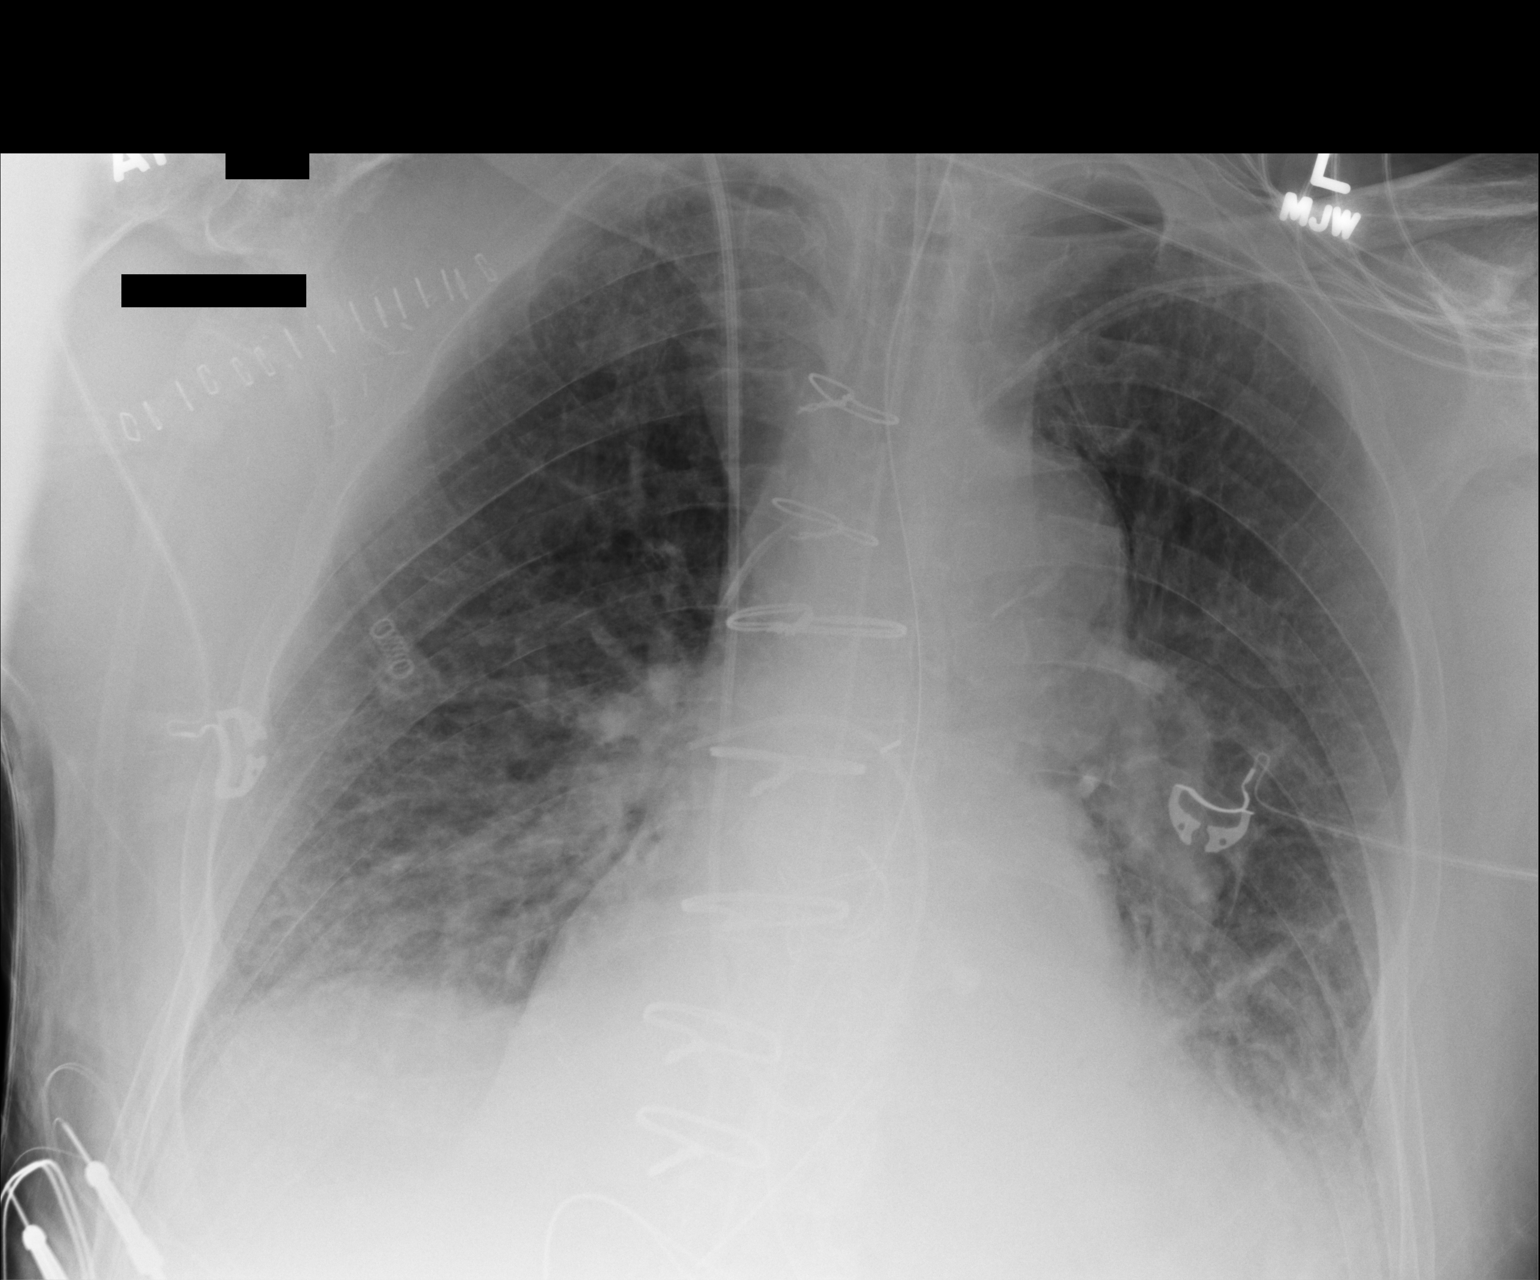

[2 of 2 positions shown; findings below may reference images not displayed]

FINDINGS: Aeration has diminished with increase in bibasilar atelectasis and
probable mild pulmonary vascular congestion. Endotracheal tube
remains well above the carina. Swan-Ganz catheter and left central
venous line are unchanged. Heart size is stable.
IMPRESSION: Diminished aeration with some increase in basilar atelectasis and
mild pulmonary vascular congestion.

## 2014-07-03 IMAGING — CR DG CHEST 1V PORT
1 series · 1 of 1 positions shown · non-contrast
Comparison: 12/12/2012

CLINICAL DATA: Post cardiac surgery.

EXAM:
PORTABLE CHEST - 1 VIEW

[AP]
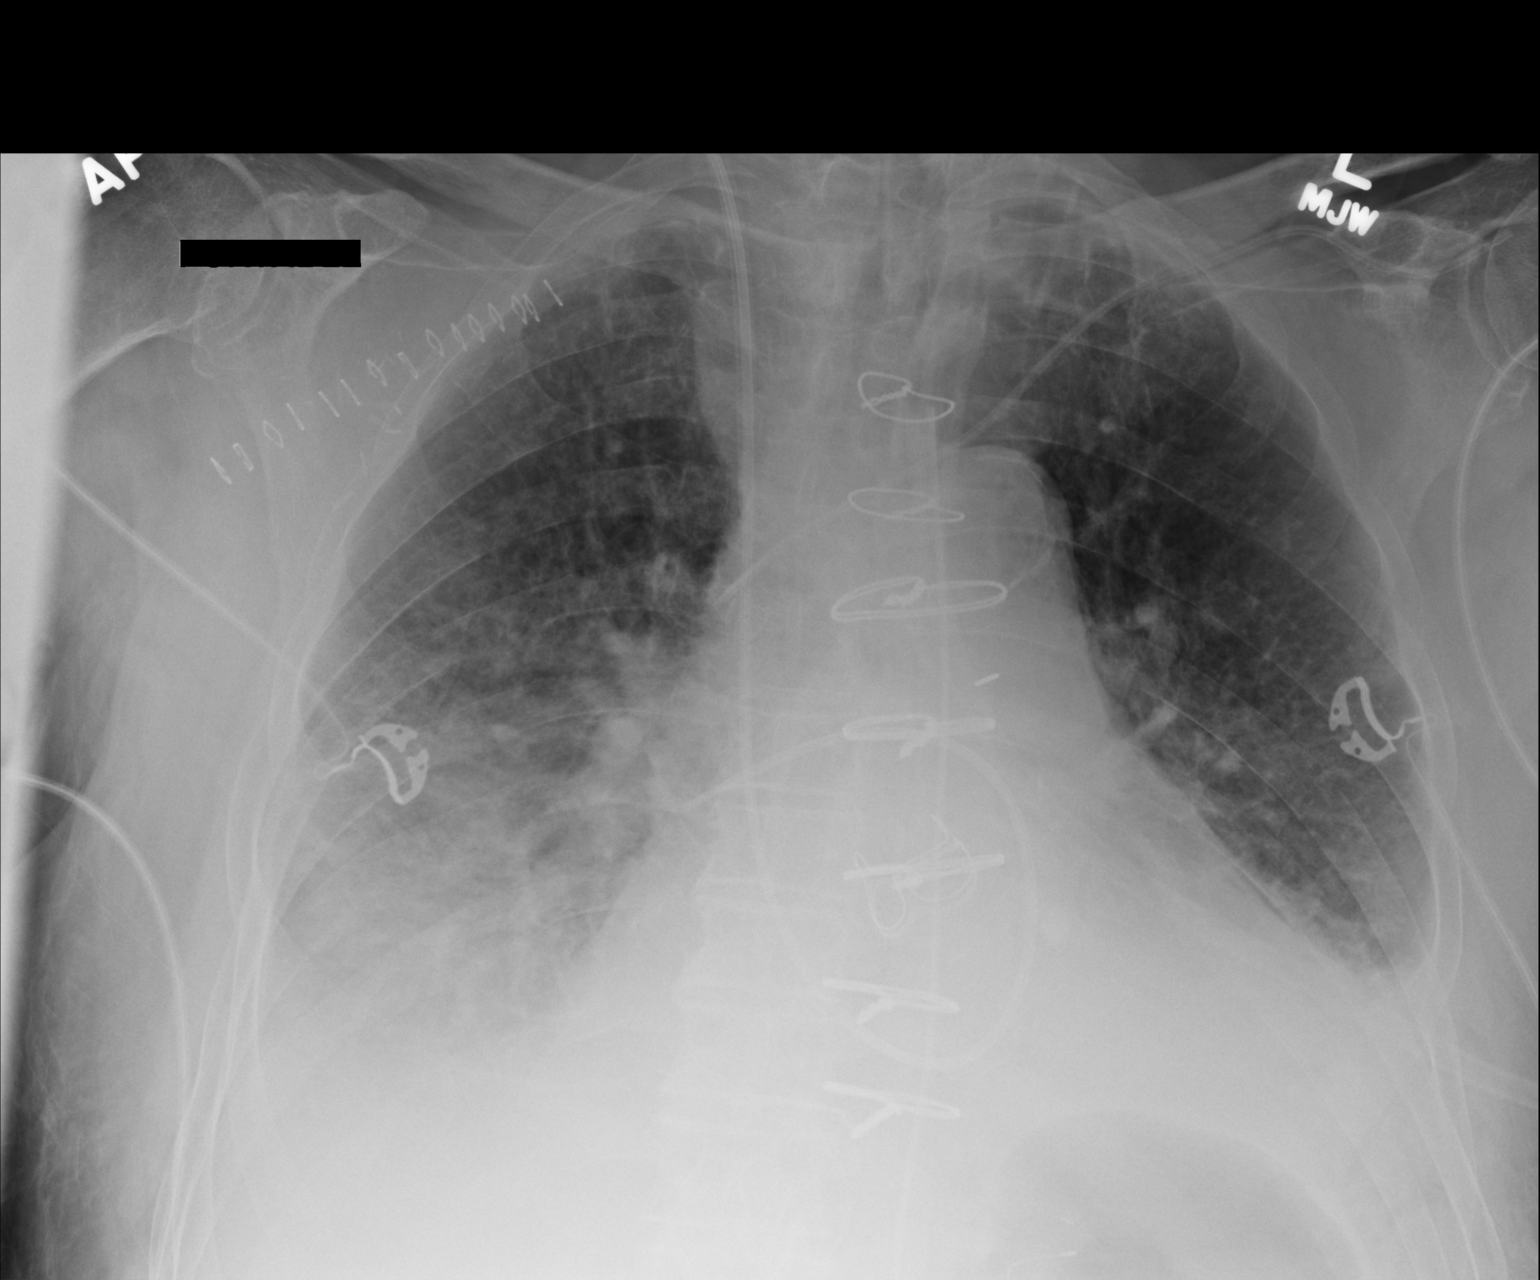

[1 of 1 positions shown; findings below may reference images not displayed]

FINDINGS: Endotracheal tube and nasogastric tube have been removed. Swan-Ganz
catheter in a stable position within the distal right pulmonary
artery or a proximal lobar branch. There is increased right basilar
densities concerning for asymmetric edema or airspace disease. There
is basilar consolidation or pleural effusions bilaterally. There is
a mediastinal drain. Left subclavian central line in the upper SVC
region. Negative for a pneumothorax.
IMPRESSION: Increased airspace densities in the right lower lung may represent
asymmetric edema.

Bibasilar densities may represent a combination atelectasis and
pleural fluid.

Negative for a pneumothorax.

## 2014-07-04 IMAGING — DX DG CHEST 1V PORT
1 series · 1 of 1 positions shown · non-contrast
Comparison: 12/13/2012.

CLINICAL DATA: Post Bentall

EXAM:
PORTABLE CHEST - 1 VIEW

[portable]
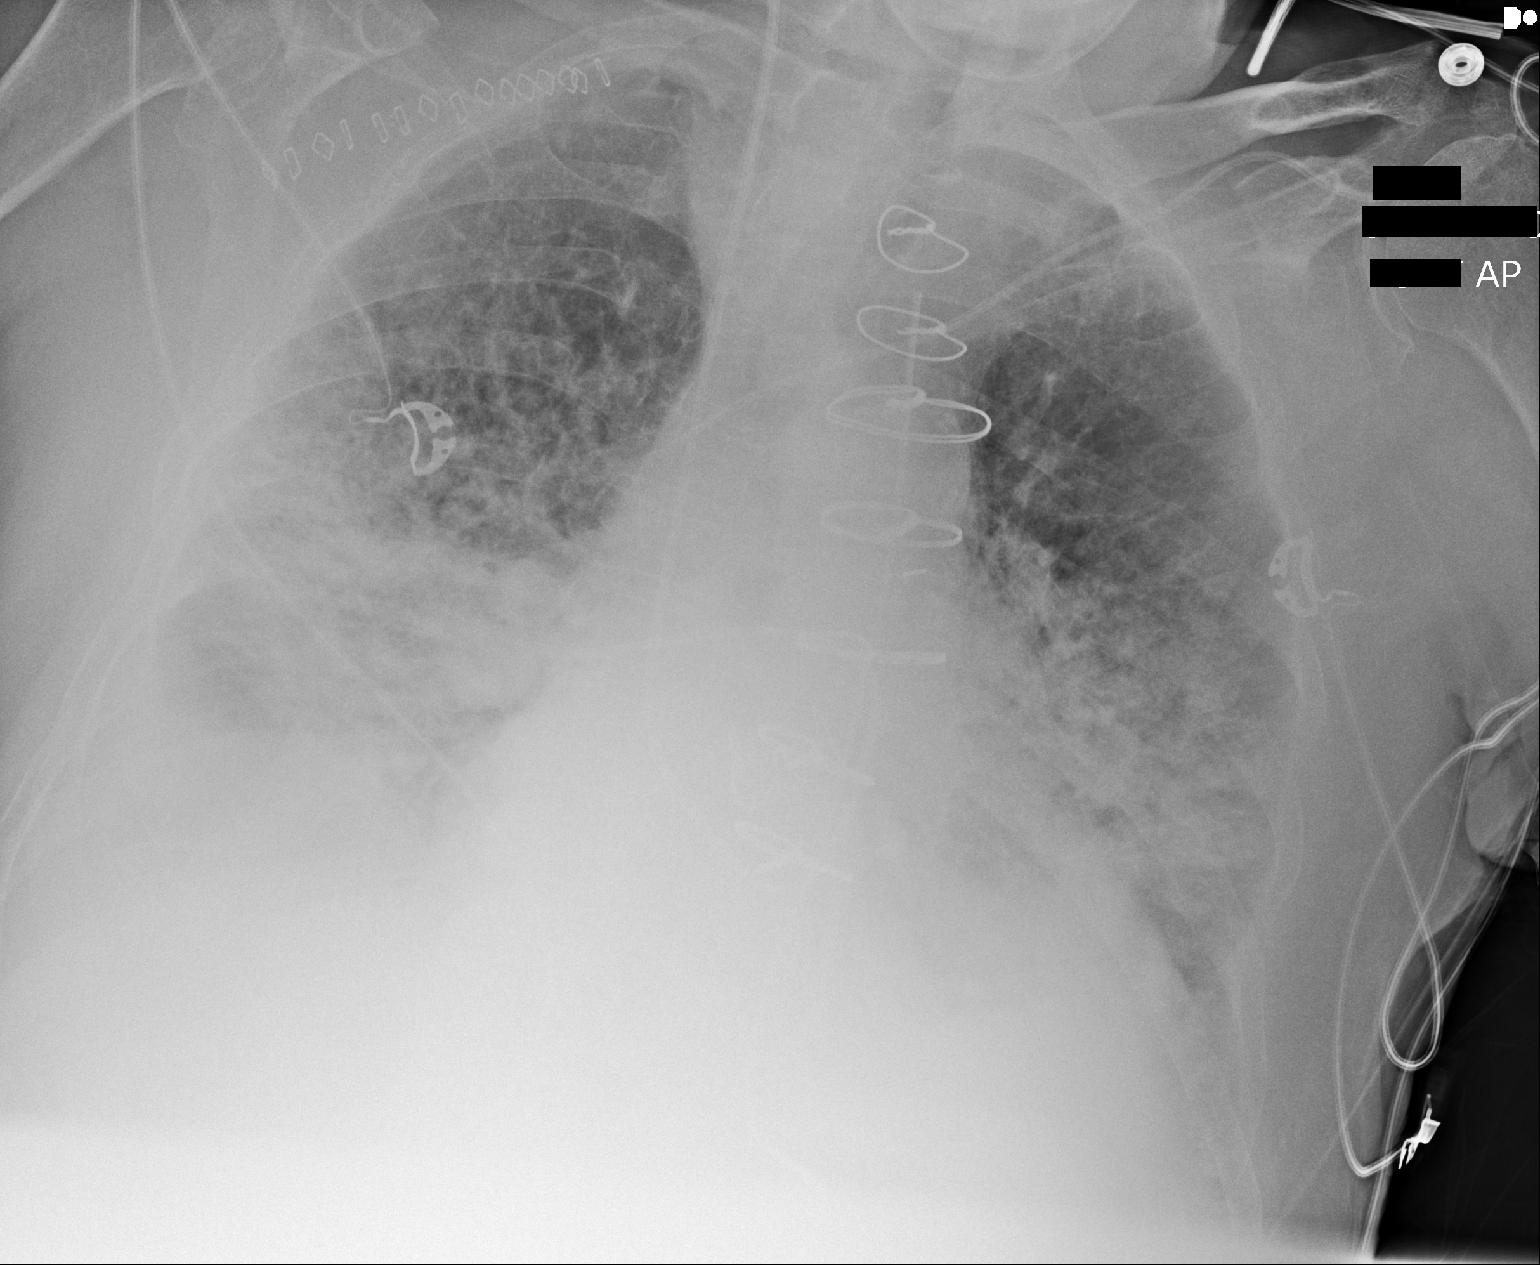

[1 of 1 positions shown; findings below may reference images not displayed]

FINDINGS: Right-sided Swan-Ganz catheter is in place with the tip projecting
over the proximal aspect of the right lower lobe pulmonary
artery/right main pulmonary artery junction.

Left central line tip projects at the level of the proximal superior
vena cava directed laterally and may be tenting the lateral aspect
of the superior vena cava. This may be repositioned with the tip
directed inferiorly.

Mediastinal drain/medially located left-sided chest tube remains in
place. No gross pneumothorax.

Asymmetric airspace disease most notable per hilar region and lung
bases with pleural effusions greater on right. This may represent
asymmetric pulmonary edema. Infectious infiltrate would be difficult
to exclude in the proper clinical setting. Basilar atelectasis
suspected.

Cardiomegaly.

Calcified tortuous aorta.

Post valve replacement.
IMPRESSION: Left central line tip projects at the level of the proximal superior
vena cava directed laterally and may be tenting the lateral aspect
of the superior vena cava. This may be repositioned with the tip
directed inferiorly.

Asymmetric airspace disease slightly change since prior exam which
may represent asymmetric pulmonary edema with basilar atelectasis.
Infectious infiltrate not entirely excluded in the proper clinical
setting.

Please see above.

This is a call report.

## 2014-07-05 IMAGING — CR DG CHEST 1V PORT
2 series · 2 of 2 positions shown · non-contrast
Comparison: 12/15/2012

CLINICAL DATA: Bentall

EXAM:
PORTABLE CHEST - 1 VIEW

[AP (1 of 2)]
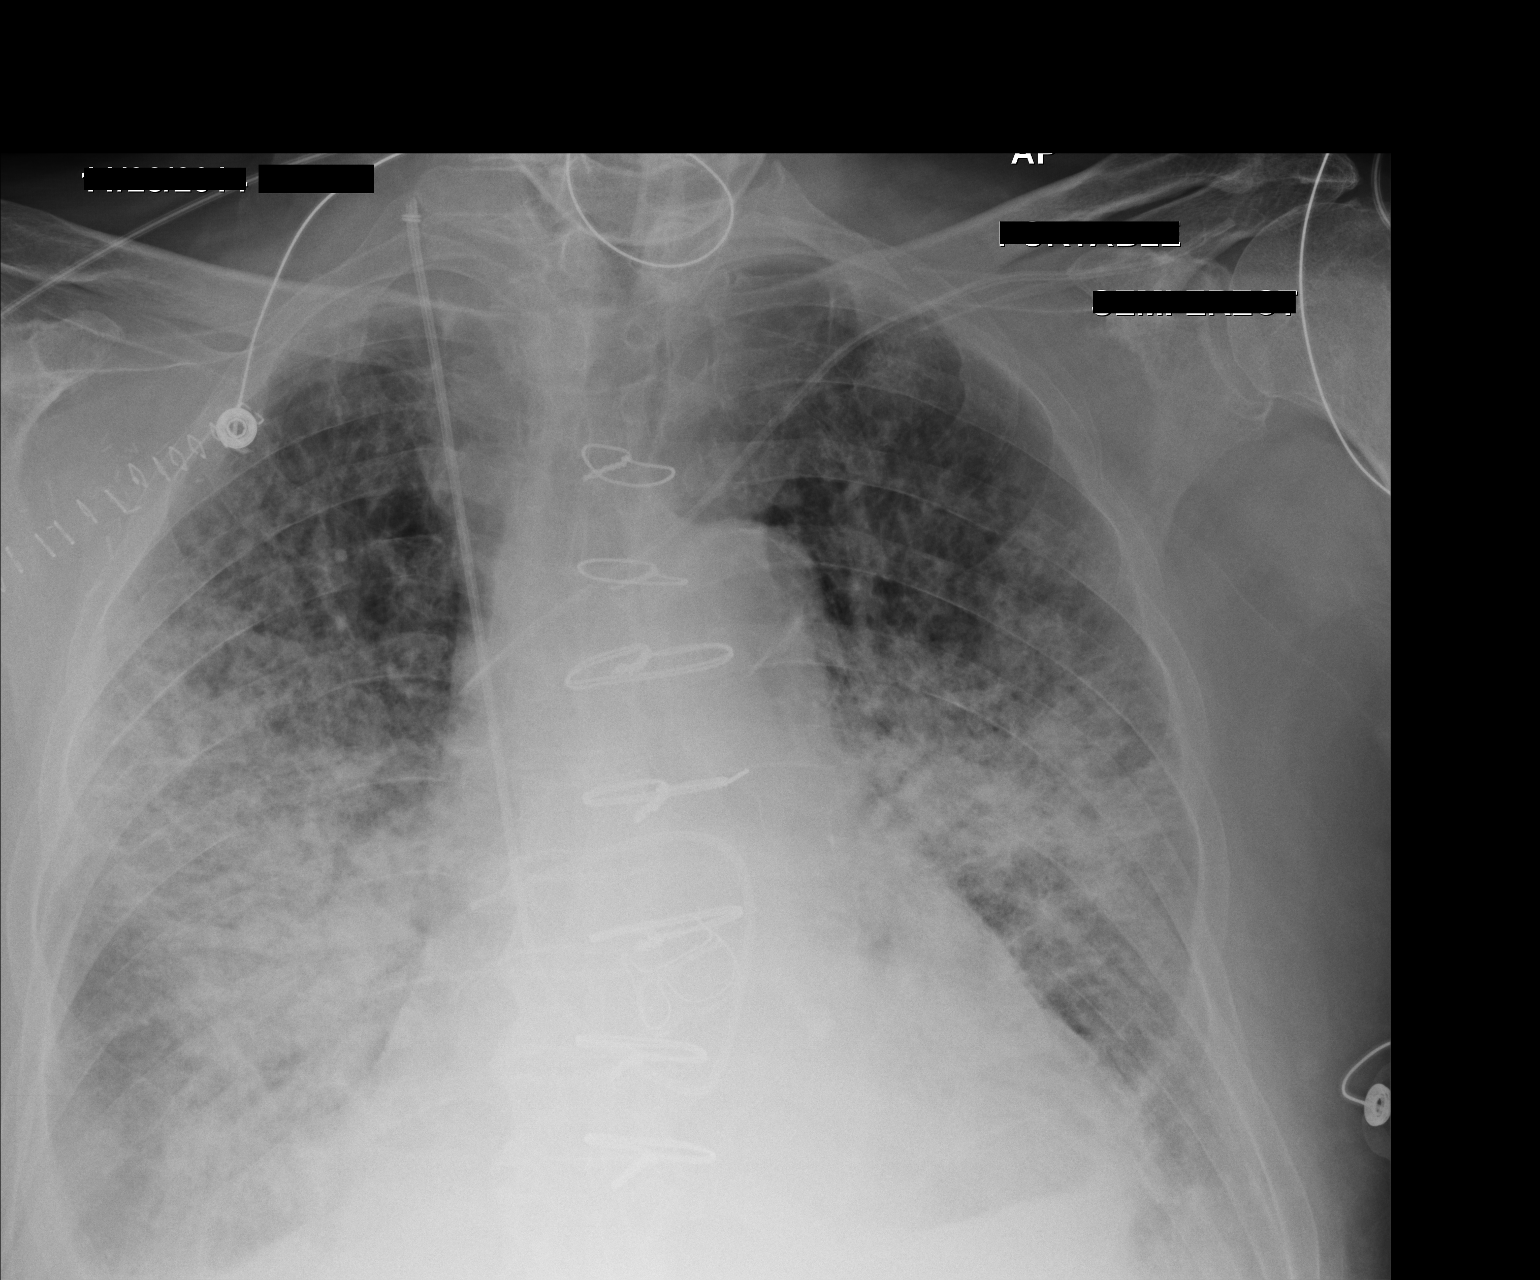

[AP (2 of 2)]
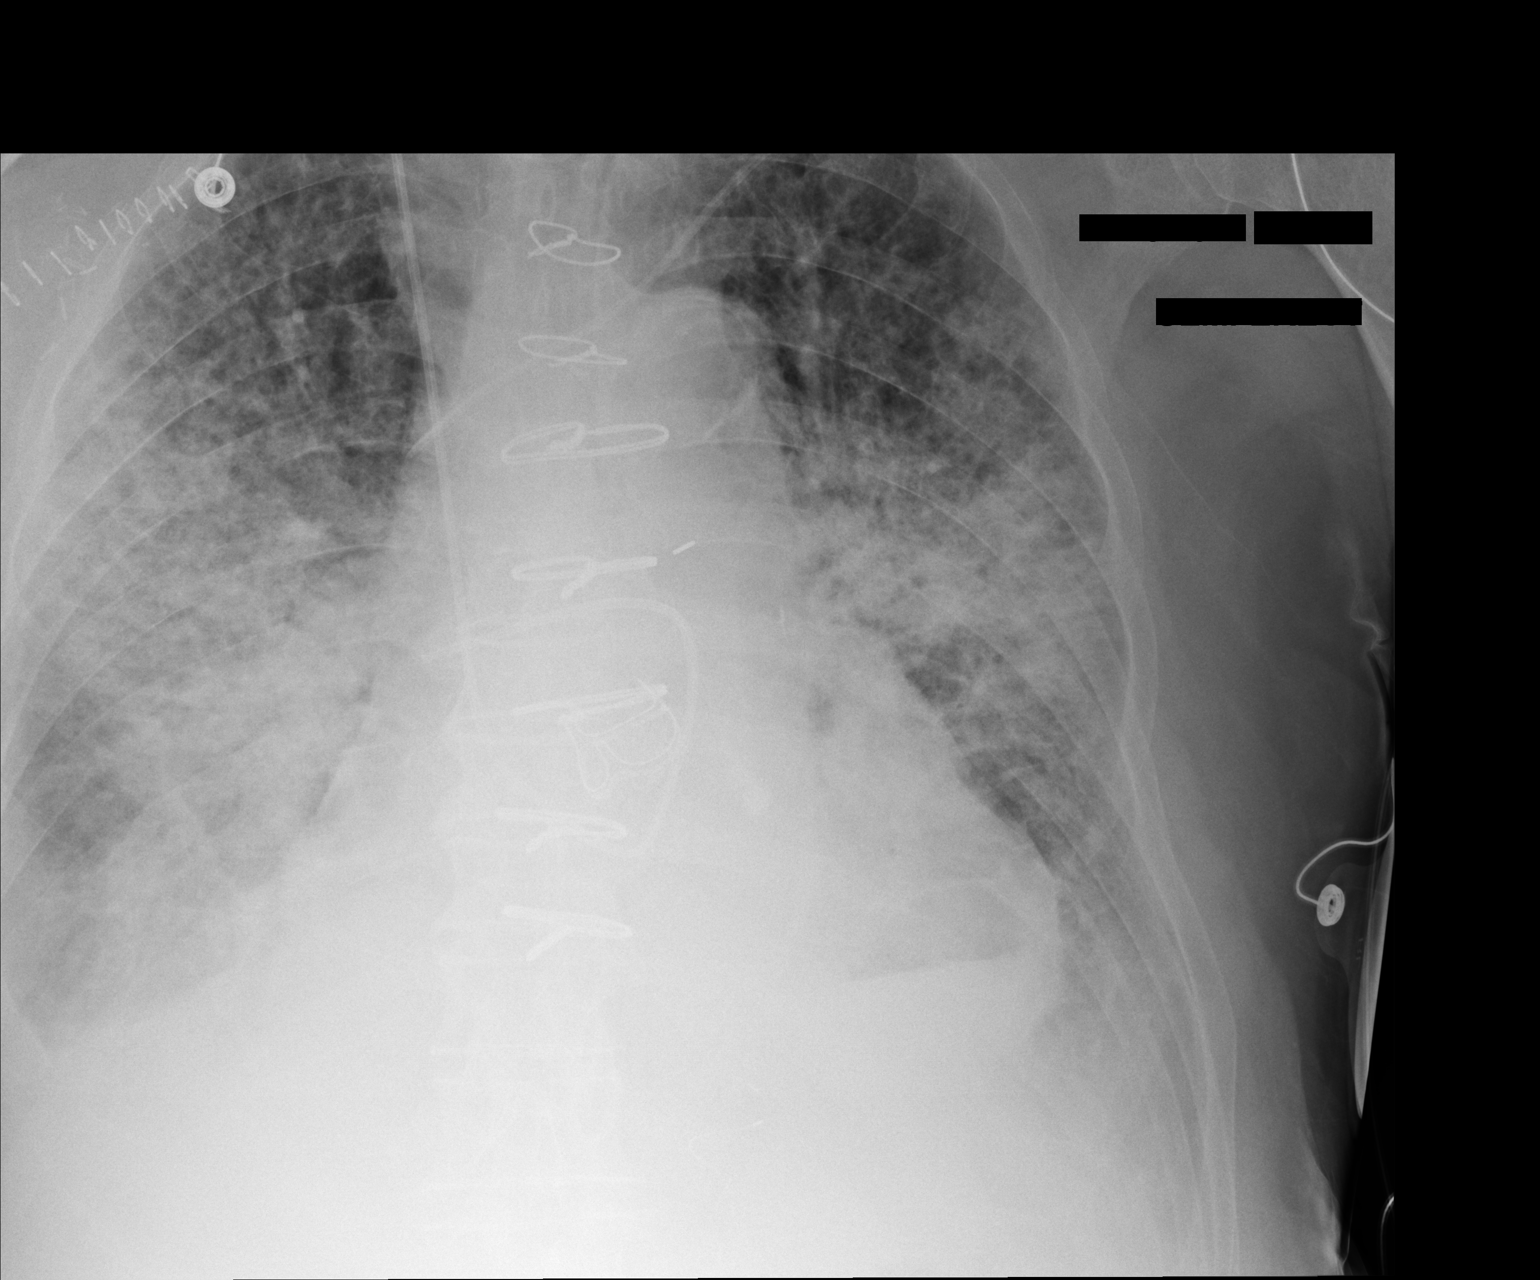

[2 of 2 positions shown; findings below may reference images not displayed]

FINDINGS: A Swan-Ganz catheter is appreciated via a right internal jugular
approach. When compared to the previous study the proximal tip of
the catheter has an irregular border and projects at the level of
the 1st rib. These findings are concerning for a fractured catheter.
The patient's attending surgeon is aware of these findings. The
distal tip of the catheter projects in the region of the right
pulmonary artery outflow tract. A left-sided central venous catheter
is appreciated with tip projecting in the region the superior vena
cava. The patient is status post median sternotomy and
valvuloplasty. The cardiac silhouette is enlarged. There is diffuse
prominence of the interstitial markings, indistinctness of the
pulmonary vasculature, and peribronchial cuffing. Confluent areas of
increased density projects within the right and left perihilar
regions. Surgical clips project within the right upper lobe region.
There is blunting of the right costophrenic angle. The osseous
structures are grossly unremarkable.
IMPRESSION: 1. Radiographic findings consistent with a fractured Swan-Ganz
catheter along its proximal portion. The patient's attending
physician is aware of these findings per telephone conversation with
the patient's floor nurse M Noor.
2. Interstitial infiltrate likely reflecting pulmonary edema.
Atelectasis and/or underlying infectious infiltrate particularly
within the right lung base cannot be excluded clinically
appropriate.
3. Small right pleural effusion

## 2014-07-05 IMAGING — DX DG CHEST 1V PORT
1 series · 1 of 1 positions shown · non-contrast
Comparison: 12/14/2012

CLINICAL DATA: Status post Bentall  procedure

EXAM:
PORTABLE CHEST - 1 VIEW

[portable]
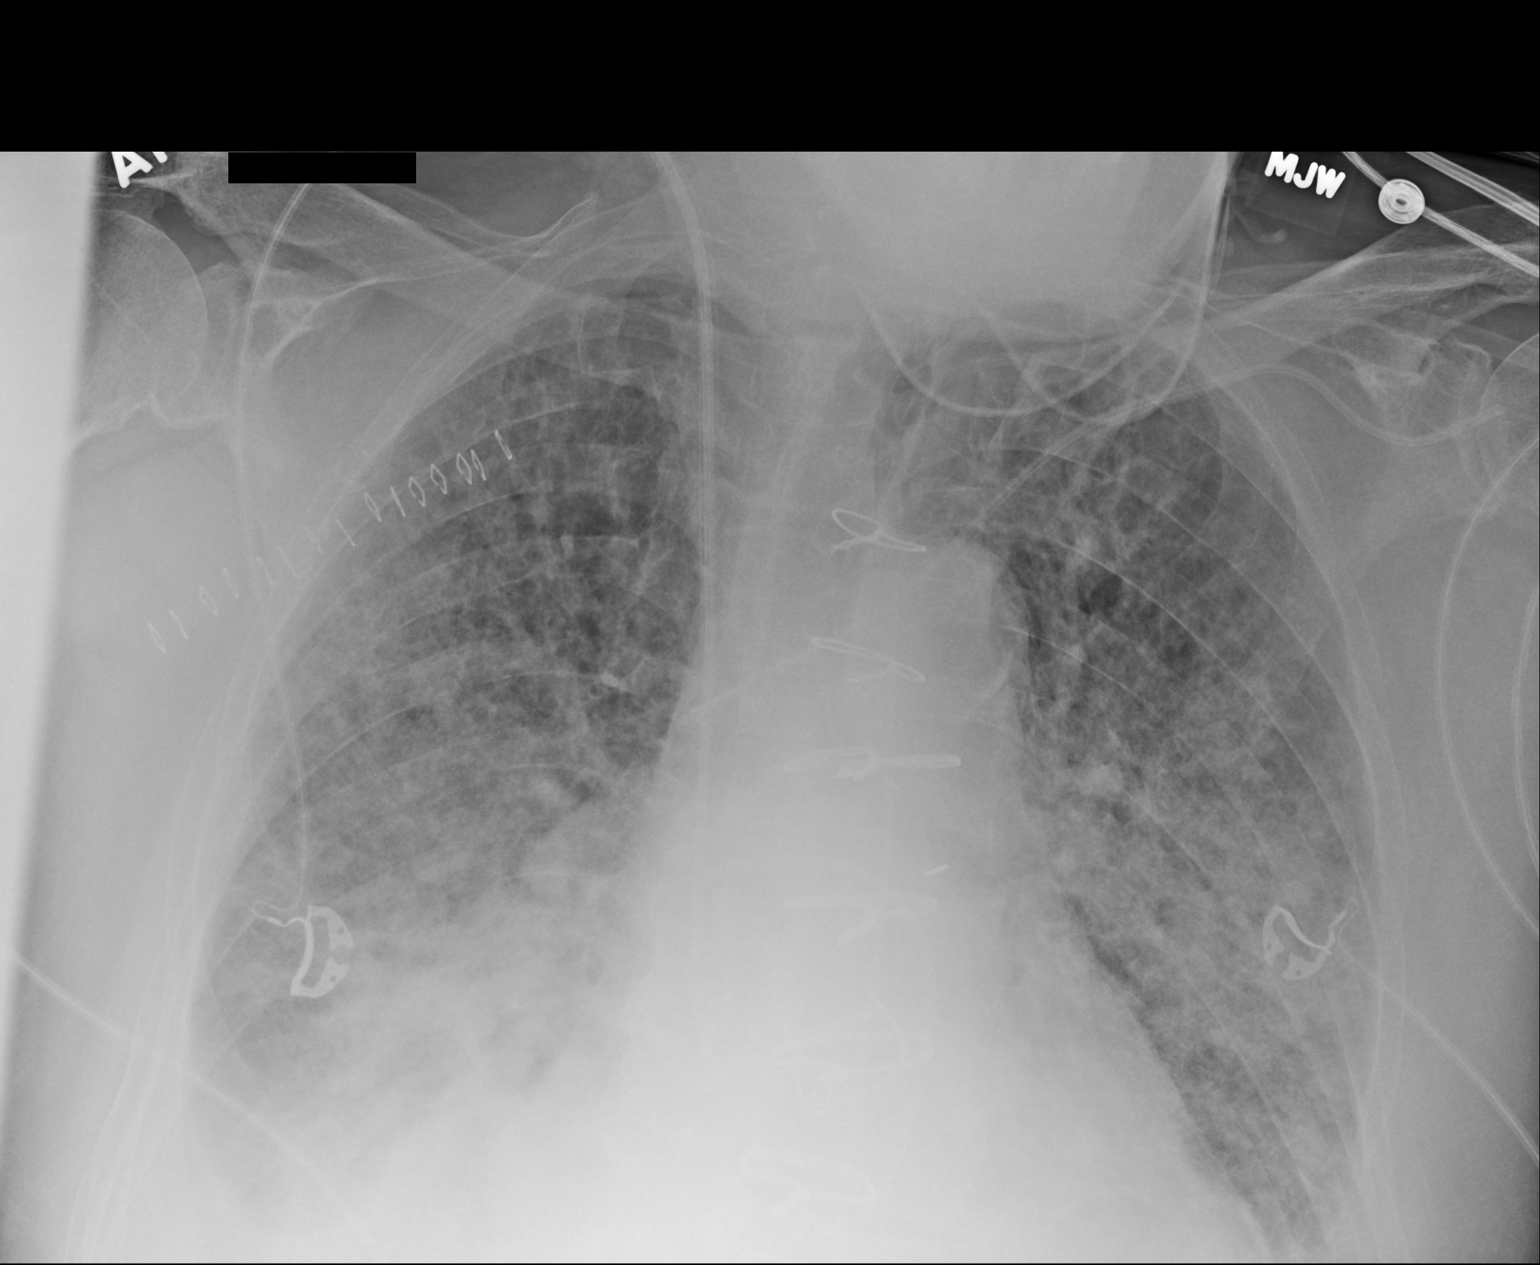

[1 of 1 positions shown; findings below may reference images not displayed]

FINDINGS: Left-sided subclavian line is again noted in the proximal superior
vena cava. A Swan-Ganz catheter is noted in the right pulmonary
artery and stable. A mediastinal drain appears to been slightly
withdrawn and now lies over the level of the aortic knob. This may
be positional in nature. Diffuse bilateral airspace disease is again
seen with superimposed infiltrates in the left mid and right lower
lung. The overall appearance is stable given some imaging variation.
No new focal abnormality is seen.
IMPRESSION: Stable appearance of the chest.

## 2014-07-06 IMAGING — CR DG CHEST 1V PORT
1 series · 1 of 1 positions shown · non-contrast
Comparison: the previous day's study and earlier studies

CLINICAL DATA: ARDS, hypertension

EXAM:
PORTABLE CHEST - 1 VIEW

[AP]
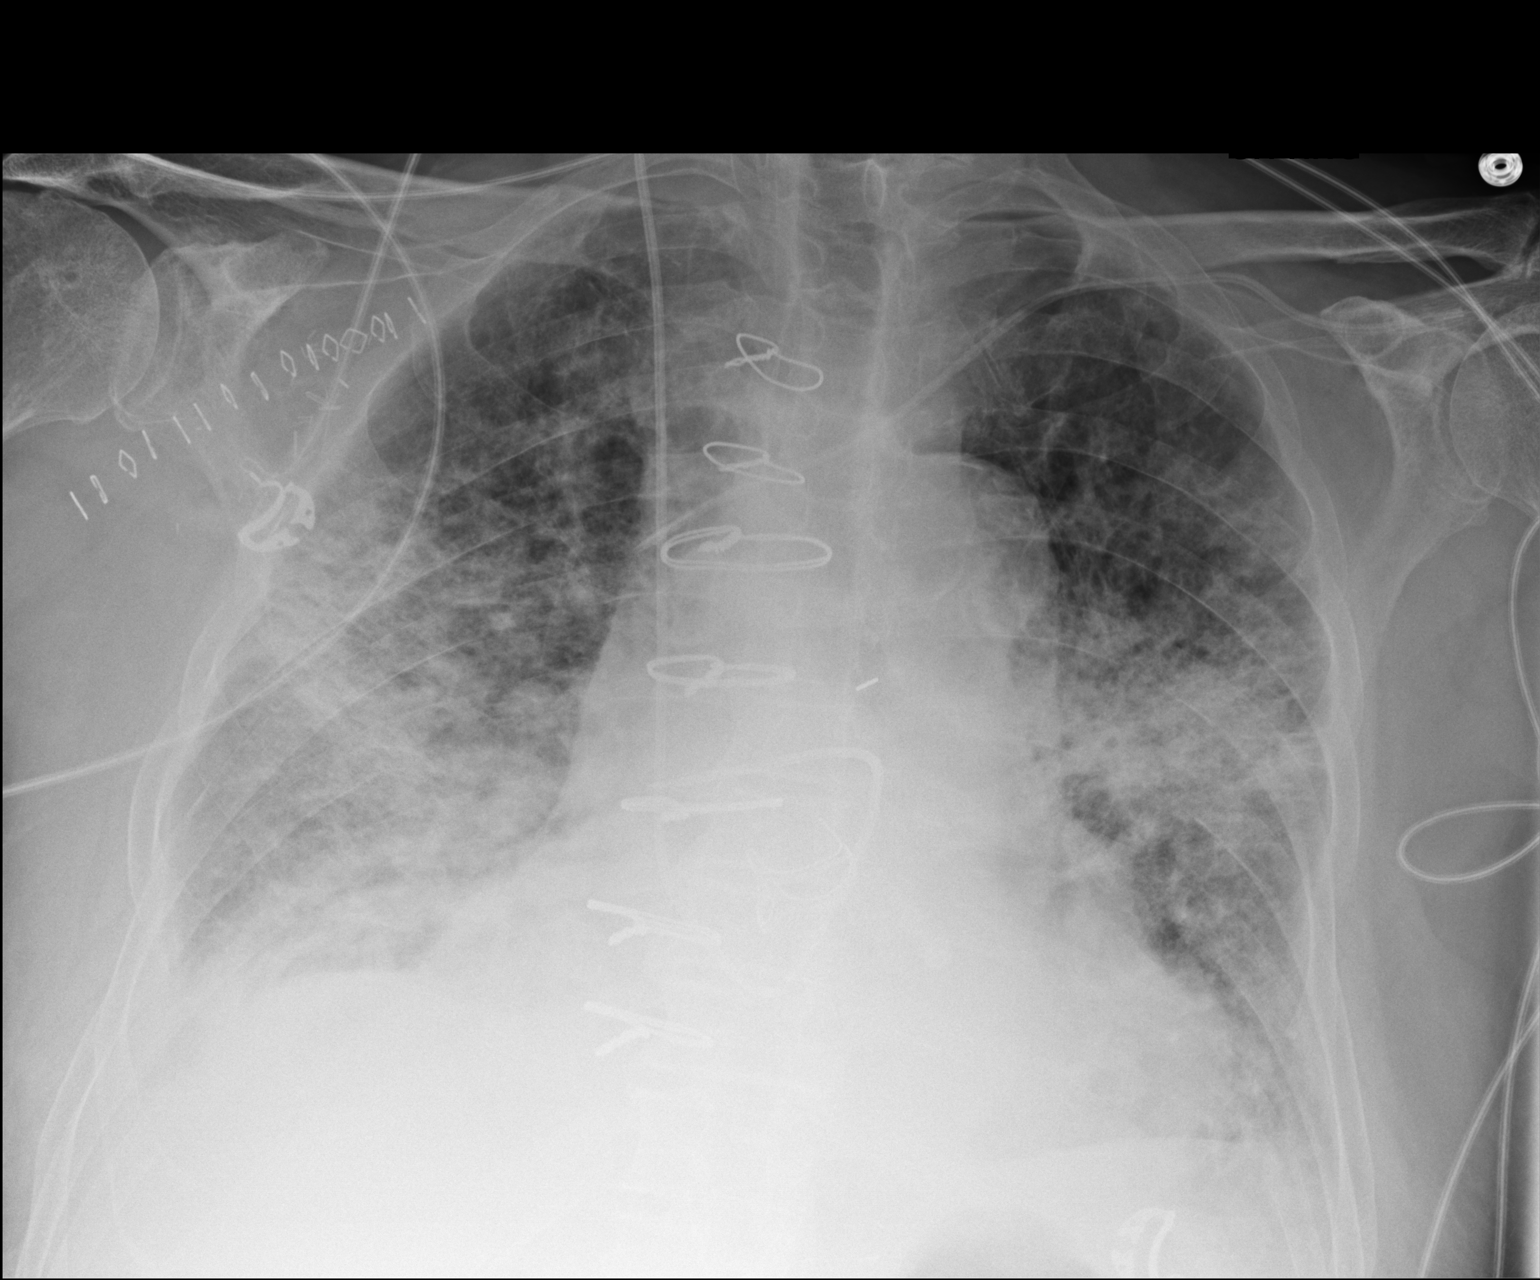

[1 of 1 positions shown; findings below may reference images not displayed]

FINDINGS: Stable left subclavian central line and right IJ fractured Mathaeus
fragment. Previous median sternotomy and AVR. Surgical clips and
skin staples project over the right scapula. Coarse airspace
opacities bilaterally with areas of confluence in the mid lung zones
and at the right base, perhaps marginally improved since previous
exam. Probable small pleural effusions.
IMPRESSION: 1. Slight improvement in moderate bilateral asymmetric airspace
disease.

## 2014-07-07 IMAGING — CR DG CHEST 1V PORT
2 series · 2 of 2 positions shown · non-contrast
Comparison: 12/16/2012; 12/15/2012; 12/12/2012; 11/08/2012

CLINICAL DATA: ARDS

EXAM:
PORTABLE CHEST - 1 VIEW

[AP (1 of 2)]
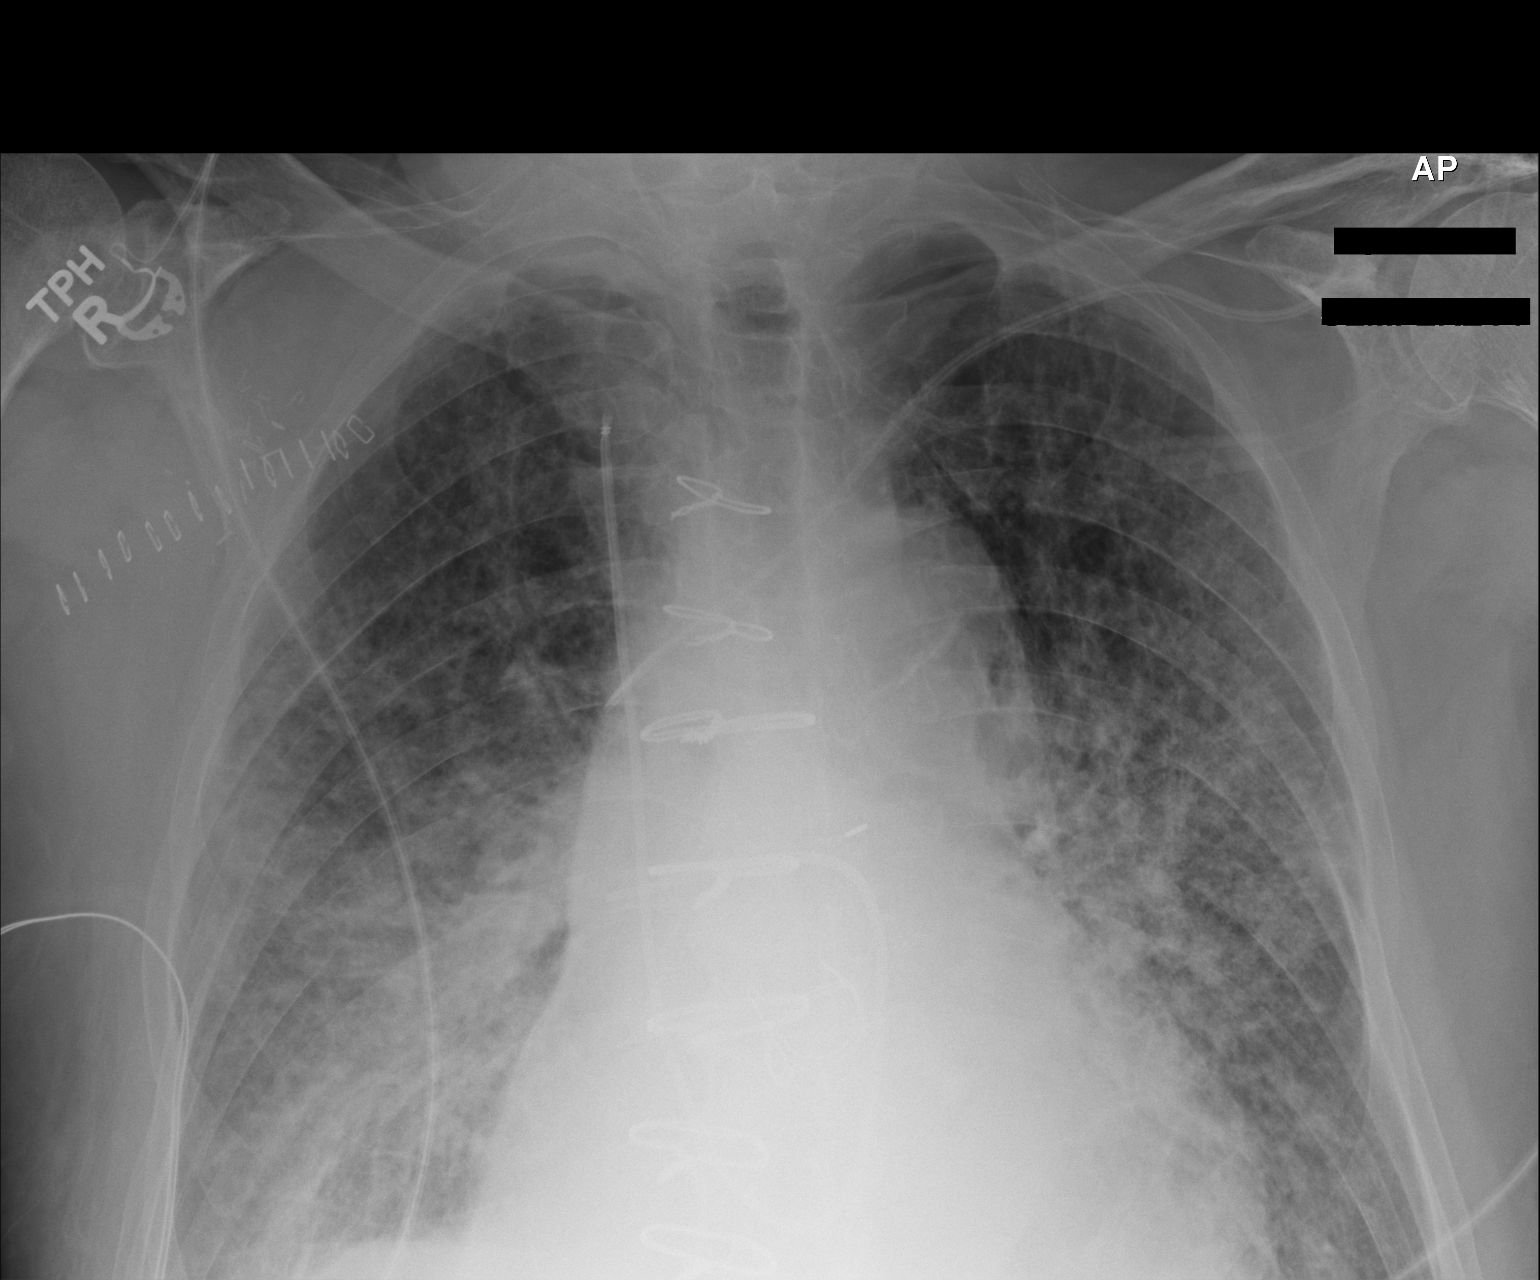

[AP (2 of 2)]
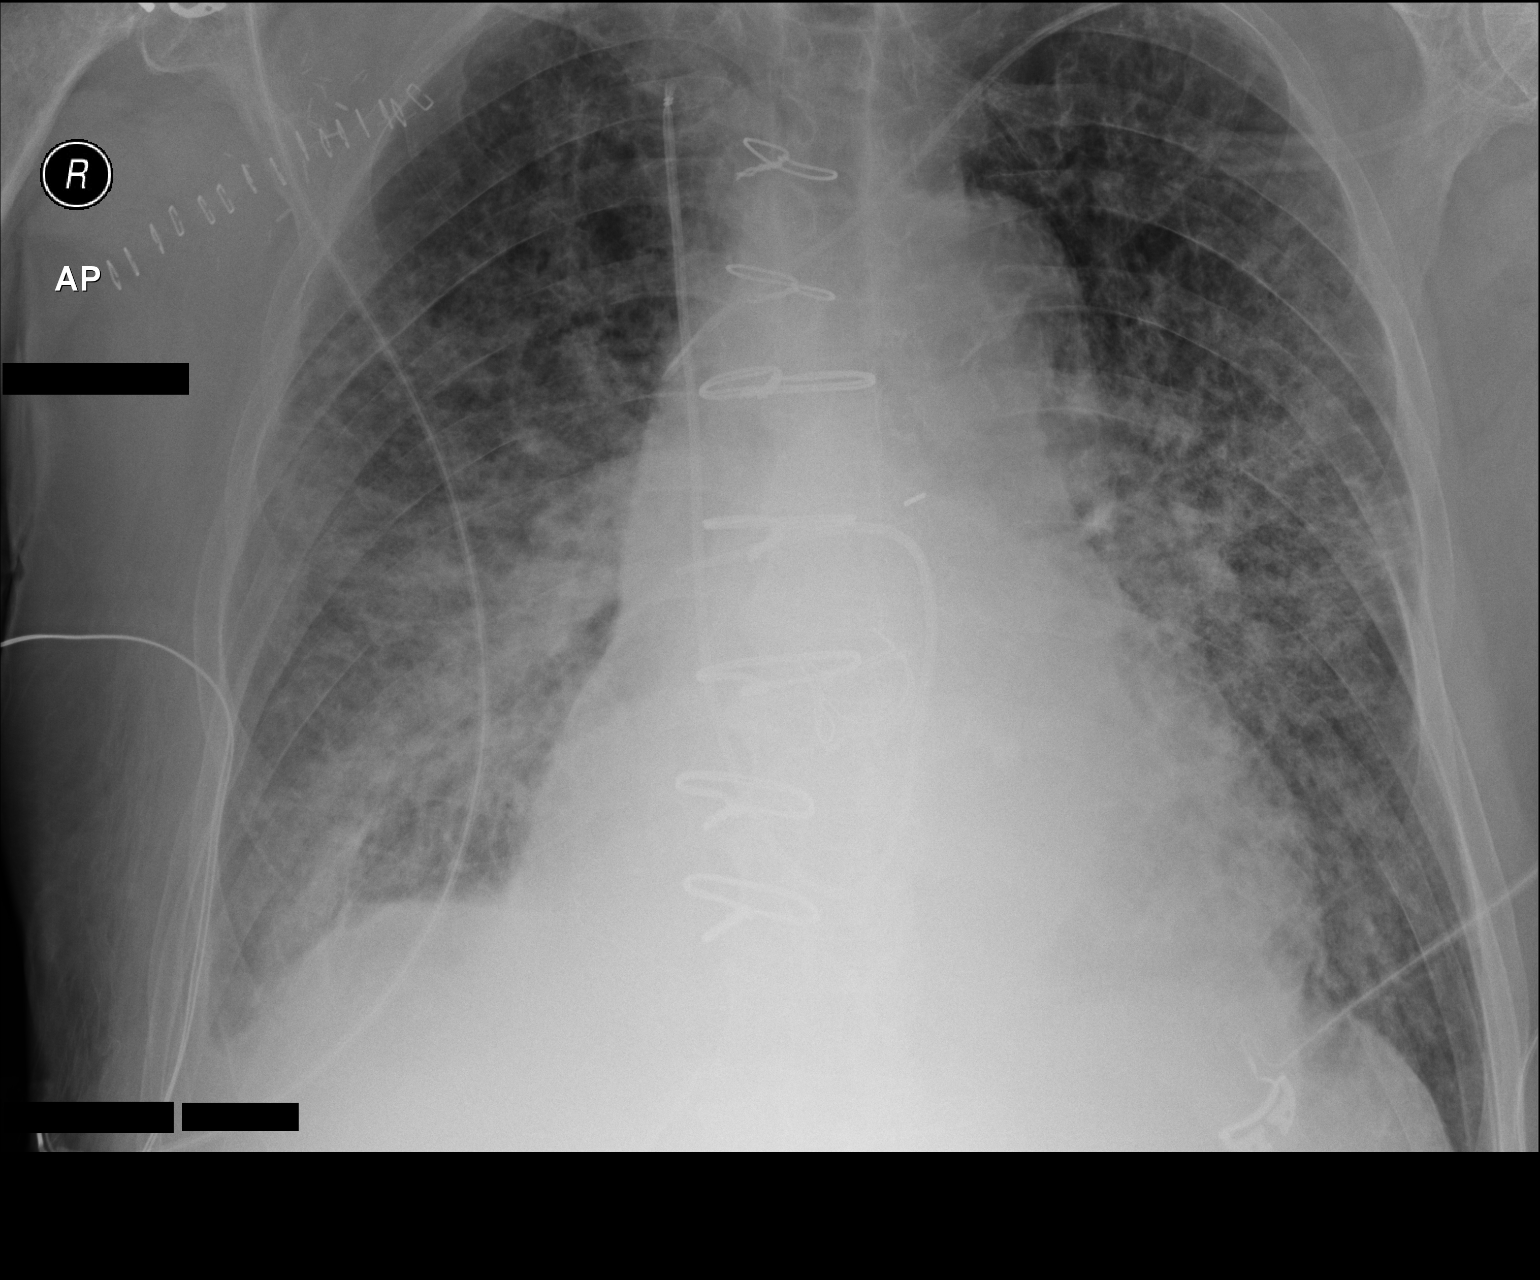

[2 of 2 positions shown; findings below may reference images not displayed]

FINDINGS: Grossly unchanged enlarged cardiac silhouette and mediastinal
contours with atherosclerotic plaque within a tortuous and possibly
ectatic thoracic aorta. Post median sternotomy and aortic valve
replacement.

Stable positioning of support apparatus including a fractured right
jugular approach Swan-Ganz catheter with tip again overlying the
expected location of the right main pulmonary artery outflow tract.

Minimally improved aeration of the lungs with persistent rather
extensive coarse heterogeneous opacities with relative areas of
consolidation within the bilateral mid and lower lungs, right
greater than left. Trace right-sided pleural effusion is suspected.
No pneumothorax. Unchanged bones. Surgical clips overlie the right
axilla.
IMPRESSION: 1. Stable position of support apparatus including a fractured right
jugular approach PA catheter with tip again overlying the expected
location of the right pulmonary artery outflow tract.
2. Minimally improved aeration of the lungs with persistent rather
extensive mid and lower lung predominant opacities with differential
considerations again including asymmetric pulmonary edema,
multifocal infection and ARDS.
3. Trace right-sided pleural effusion.
Critical Value/emergent results were again called by telephone at
the time of interpretation on 12/17/2012 at [DATE] to the patient's
nurse who verbally acknowledged these results.

## 2014-07-08 IMAGING — DX DG CHEST 1V PORT
1 series · 1 of 1 positions shown · non-contrast
Comparison: [DATE] [DATE], [DATE] [DATE] a.m.

CLINICAL DATA: New PICC line insertion

EXAM:
PORTABLE CHEST - 1 VIEW

[portable]
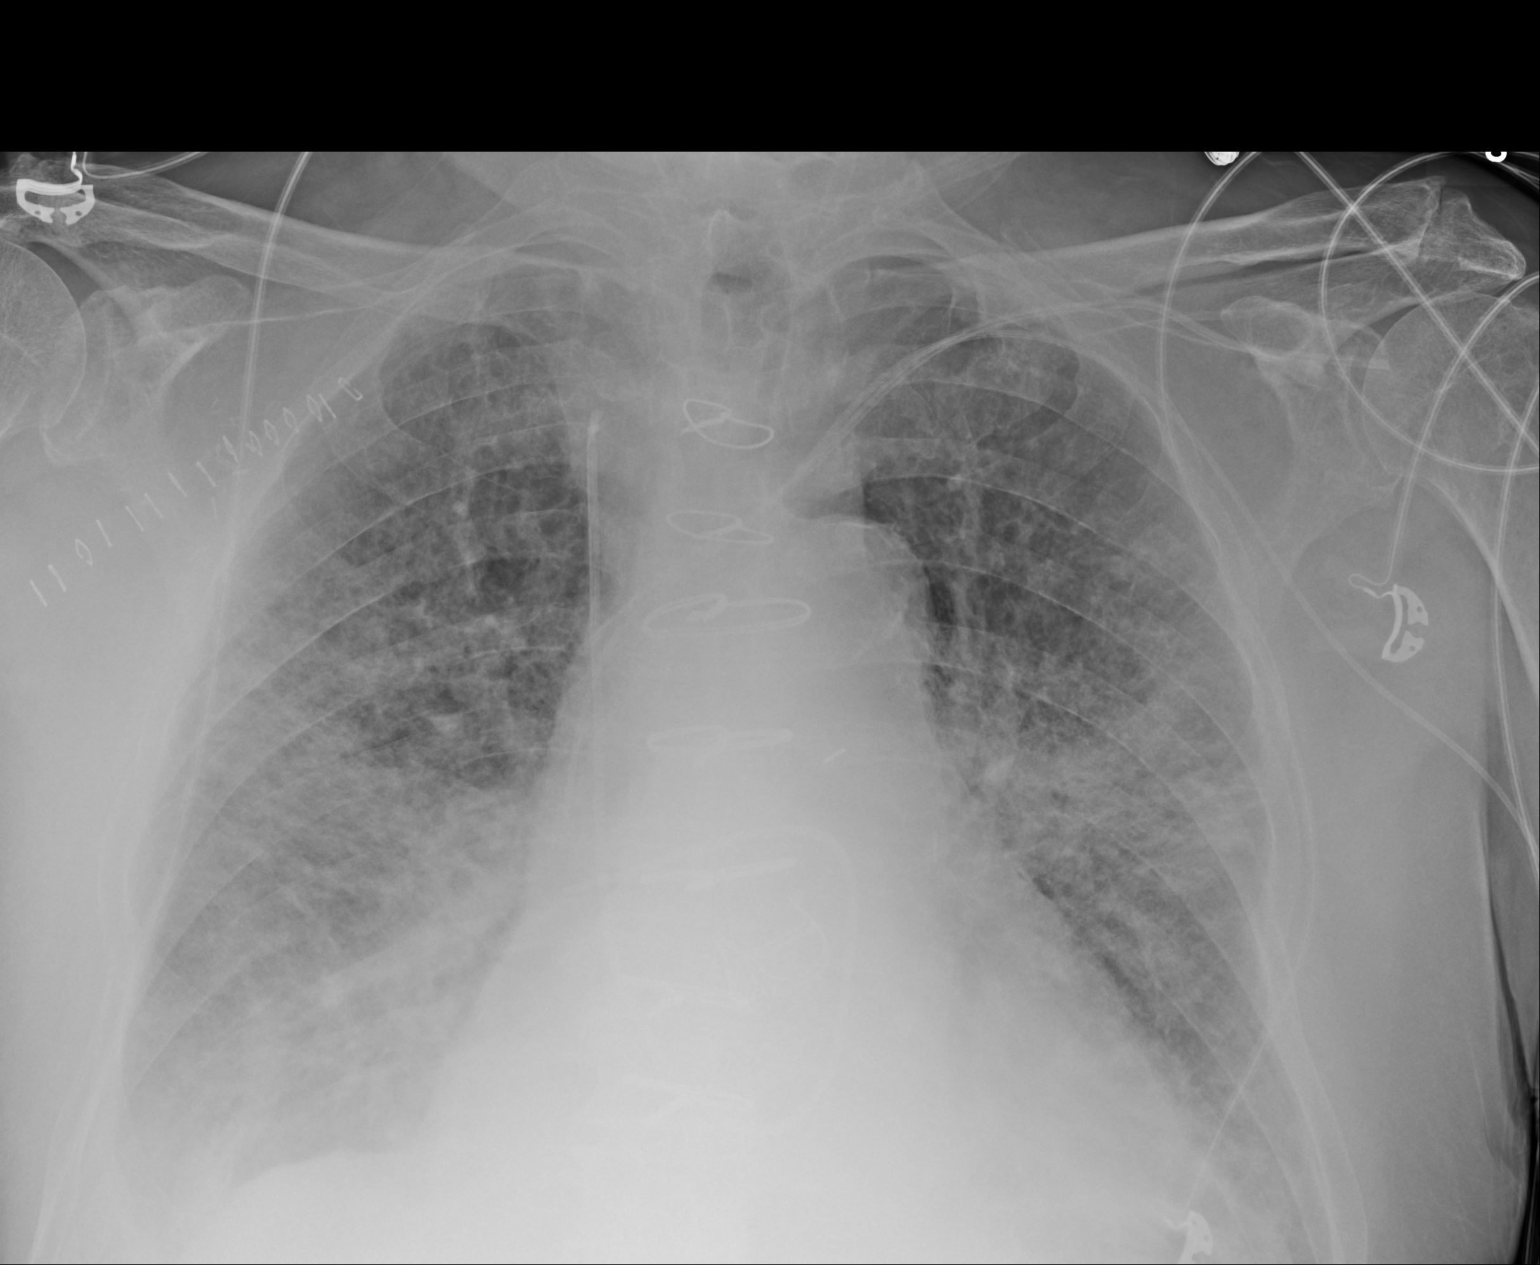

[1 of 1 positions shown; findings below may reference images not displayed]

FINDINGS: There is diffuse airspace opacity in both lungs not significantly
changed. The heart size is enlarged. Patient is status post prior
median sternotomy. Cardiac valvular replacement ring is identified.
The previously noted left subclavian catheter line is unchanged.
There is a 2nd left subclavian catheter line with distal tip in the
superior vena cava. Retained fragment of a right central catheter is
identified unchanged. There is no pneumothorax.
IMPRESSION: A 2nd new subclavian catheter line is identified with distal tip in
the superior vena cava. There is no pneumothorax. Diffuse airspace
opacity in both lungs, not changed.

## 2014-07-08 IMAGING — CR DG CHEST 1V PORT
1 series · 1 of 1 positions shown · non-contrast
Comparison: December 17, 2012.

CLINICAL DATA: Retained catheter.

EXAM:
PORTABLE CHEST - 1 VIEW

[AP]
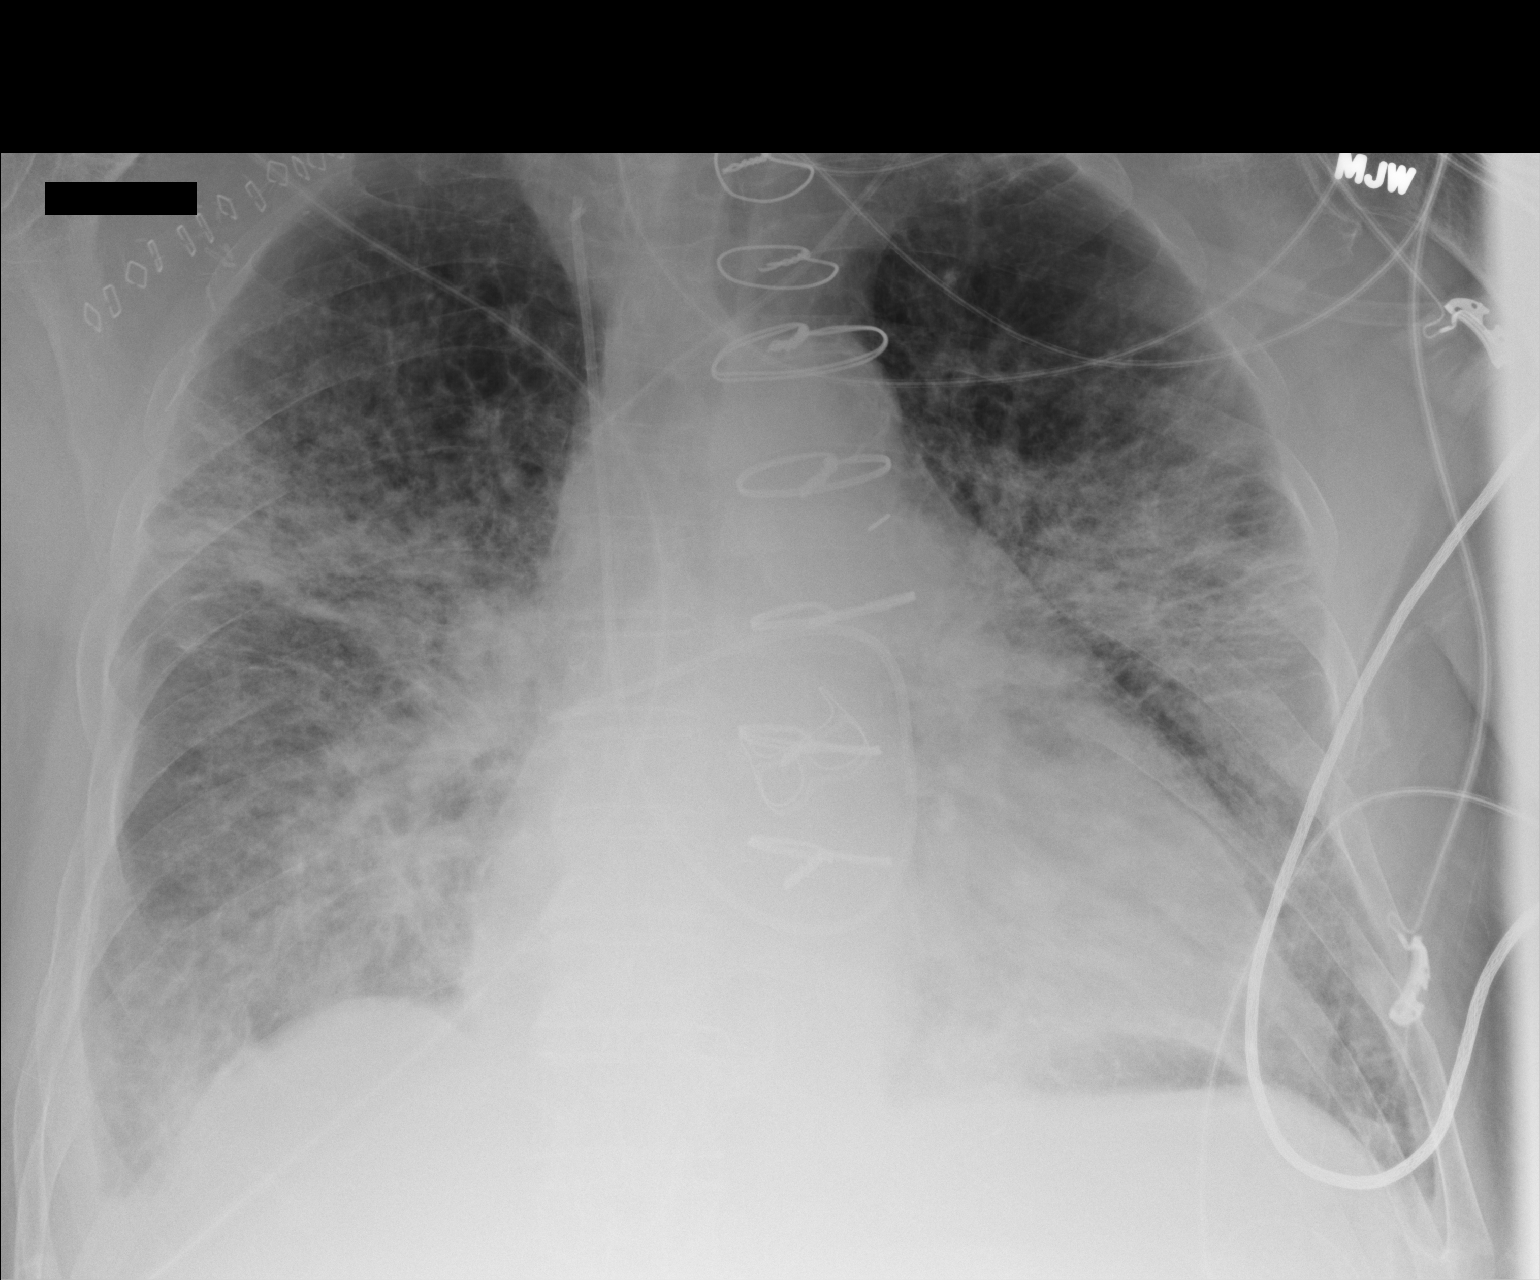

[1 of 1 positions shown; findings below may reference images not displayed]

FINDINGS: Stable cardiomegaly. Sternotomy wires are noted. Stable left
subclavian catheter line with tip in expected position of the SVC.
Status post cardiac valve replacement. Retained fragment of right
central venous catheter is noted with tip in the expected position
of the right pulmonary artery; this is unchanged in position
compared to prior exam. No pneumothorax is noted. Bilateral airspace
opacities are noted which are slightly more prominent in the upper
lobes concerning for pneumonia or edema.
IMPRESSION: No change seen involving retained catheter fragment extending from
right internal jugular vein with distal tip in the expected position
the right pulmonary artery. Bilateral airspace opacity is again
noted with increased upper lobe opacities concerning for pneumonia
or edema.

## 2014-07-09 IMAGING — DX DG CHEST 1V PORT
1 series · 1 of 1 positions shown · non-contrast
Comparison: 12/18/2012.  12/17/2012.

CLINICAL DATA: ARDS.

EXAM:
PORTABLE CHEST - 1 VIEW

[portable]
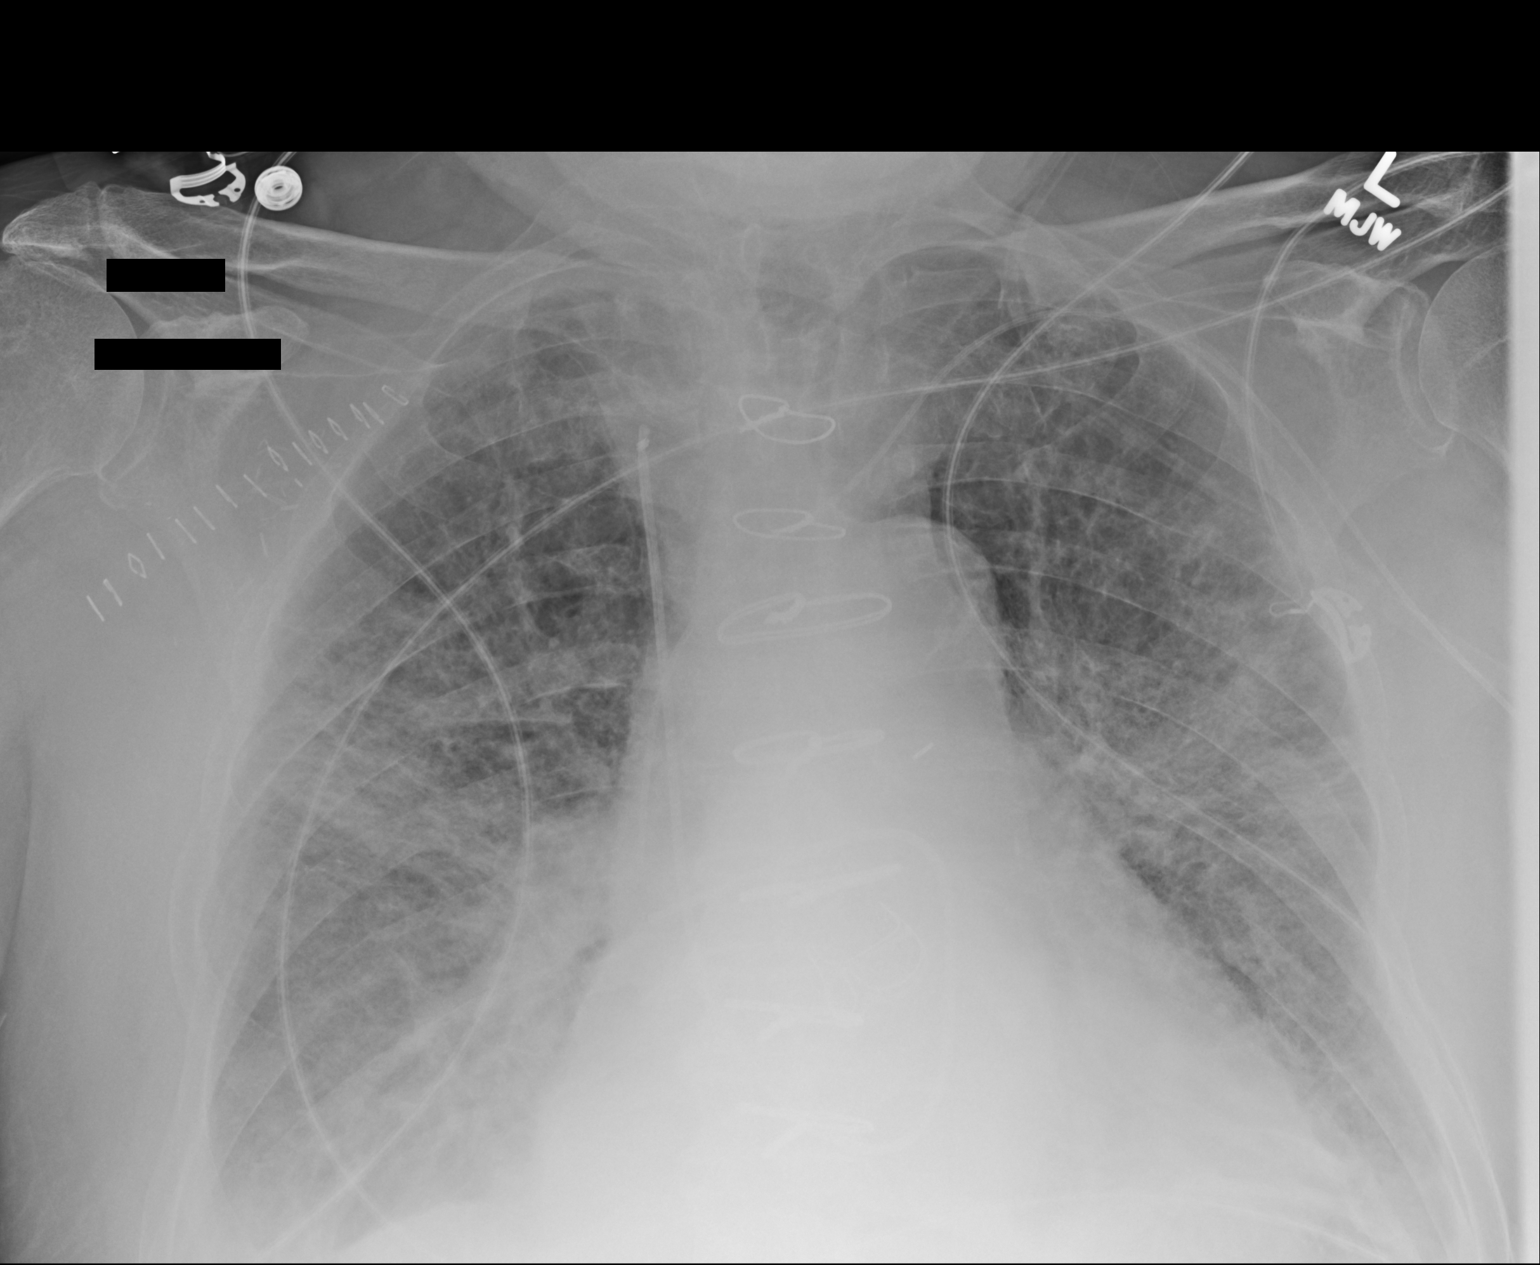

[1 of 1 positions shown; findings below may reference images not displayed]

FINDINGS: Again noted is a fractured Swan-Ganz catheter from right jugular
approach. Swan-Ganz catheter tip is in the right pulmonary outflow
tract. The catheter has not migrated. PICC line is noted. Left
central line has been removed. Surgical staples are noted over the
right chest. Dense bilateral pulmonary infiltrates are present and
unchanged consistent with the patient's clinical diagnosis of ARDS.
Cardiogenic pulmonary edema and pneumonia could also present this
fashion. There is no pneumothorax. Stable cardiomegaly. Prior CABG.
Prior cardiac valve replacement.
IMPRESSION: 1. Stable position of fractured Swan-Ganz catheter. Interval removal
of left subclavian central line. PICC line in stable position.
2. Persistent dense bilateral pulmonary infiltrates consistent with
patient's clinical diagnosis of ARDS.

## 2014-07-11 IMAGING — CR DG CHEST 1V PORT
1 series · 1 of 1 positions shown · non-contrast
Comparison: 12/19/2012

CLINICAL DATA: Status post redo Bentall procedure.

EXAM:
PORTABLE CHEST - 1 VIEW

[AP]
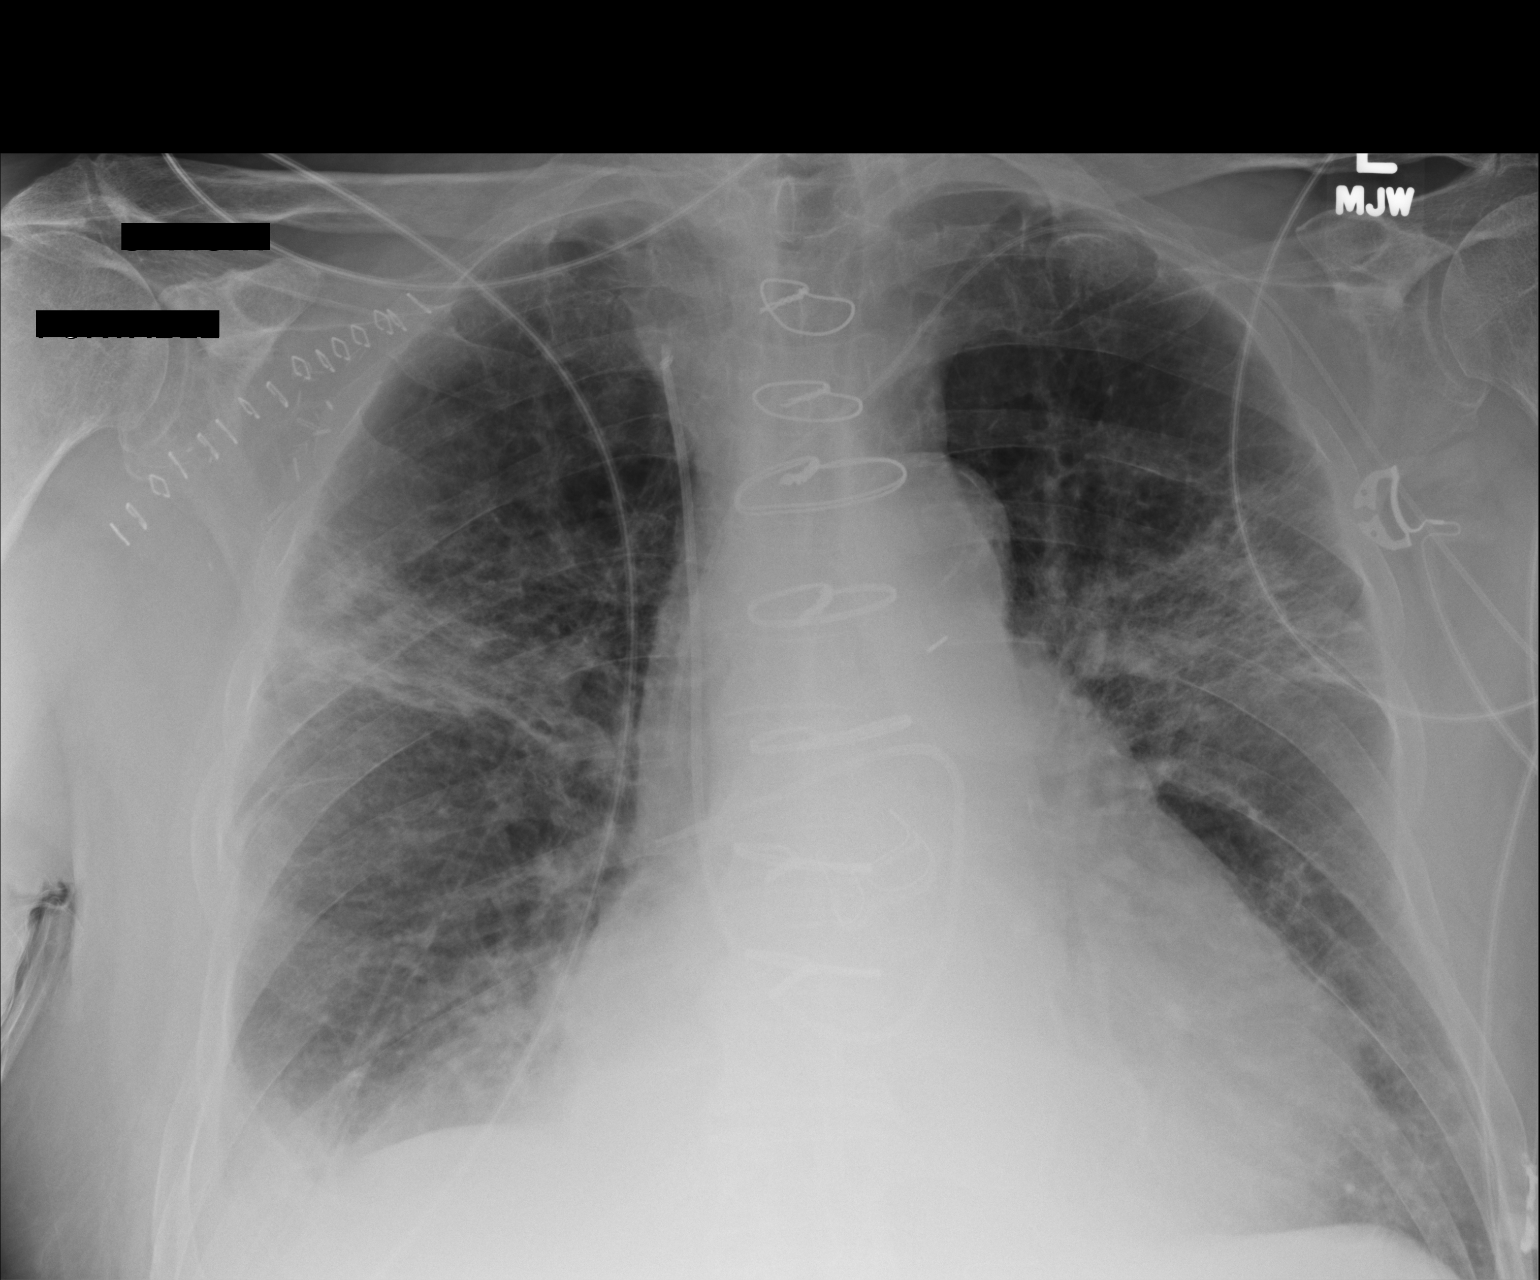

[1 of 1 positions shown; findings below may reference images not displayed]

FINDINGS: The retained Swan-Ganz catheter fragment shows stable position.
There is interval placement of a left upper extremity PICC line with
the tip in the lower SVC. The subclavian central catheter has been
removed. No pneumothorax or significant pulmonary edema is
identified. There is increase in bilateral perihilar atelectasis.
The heart size and mediastinal contours are stable.
IMPRESSION: Interval increase in bilateral perihilar atelectasis. Removal of
left subclavian central catheter and placement of interval left PICC
line with the tip in the lower SVC. The retained Swan-Ganz catheter
fragment shows stable positioning.

## 2014-07-12 IMAGING — CR DG CHEST 2V
2 series · 2 of 2 positions shown · non-contrast
Comparison: 12/21/2012

CLINICAL DATA: Cough, weakness, shortness of breath, post Bentall
procedure

EXAM:
CHEST  2 VIEW

[w chest pa]
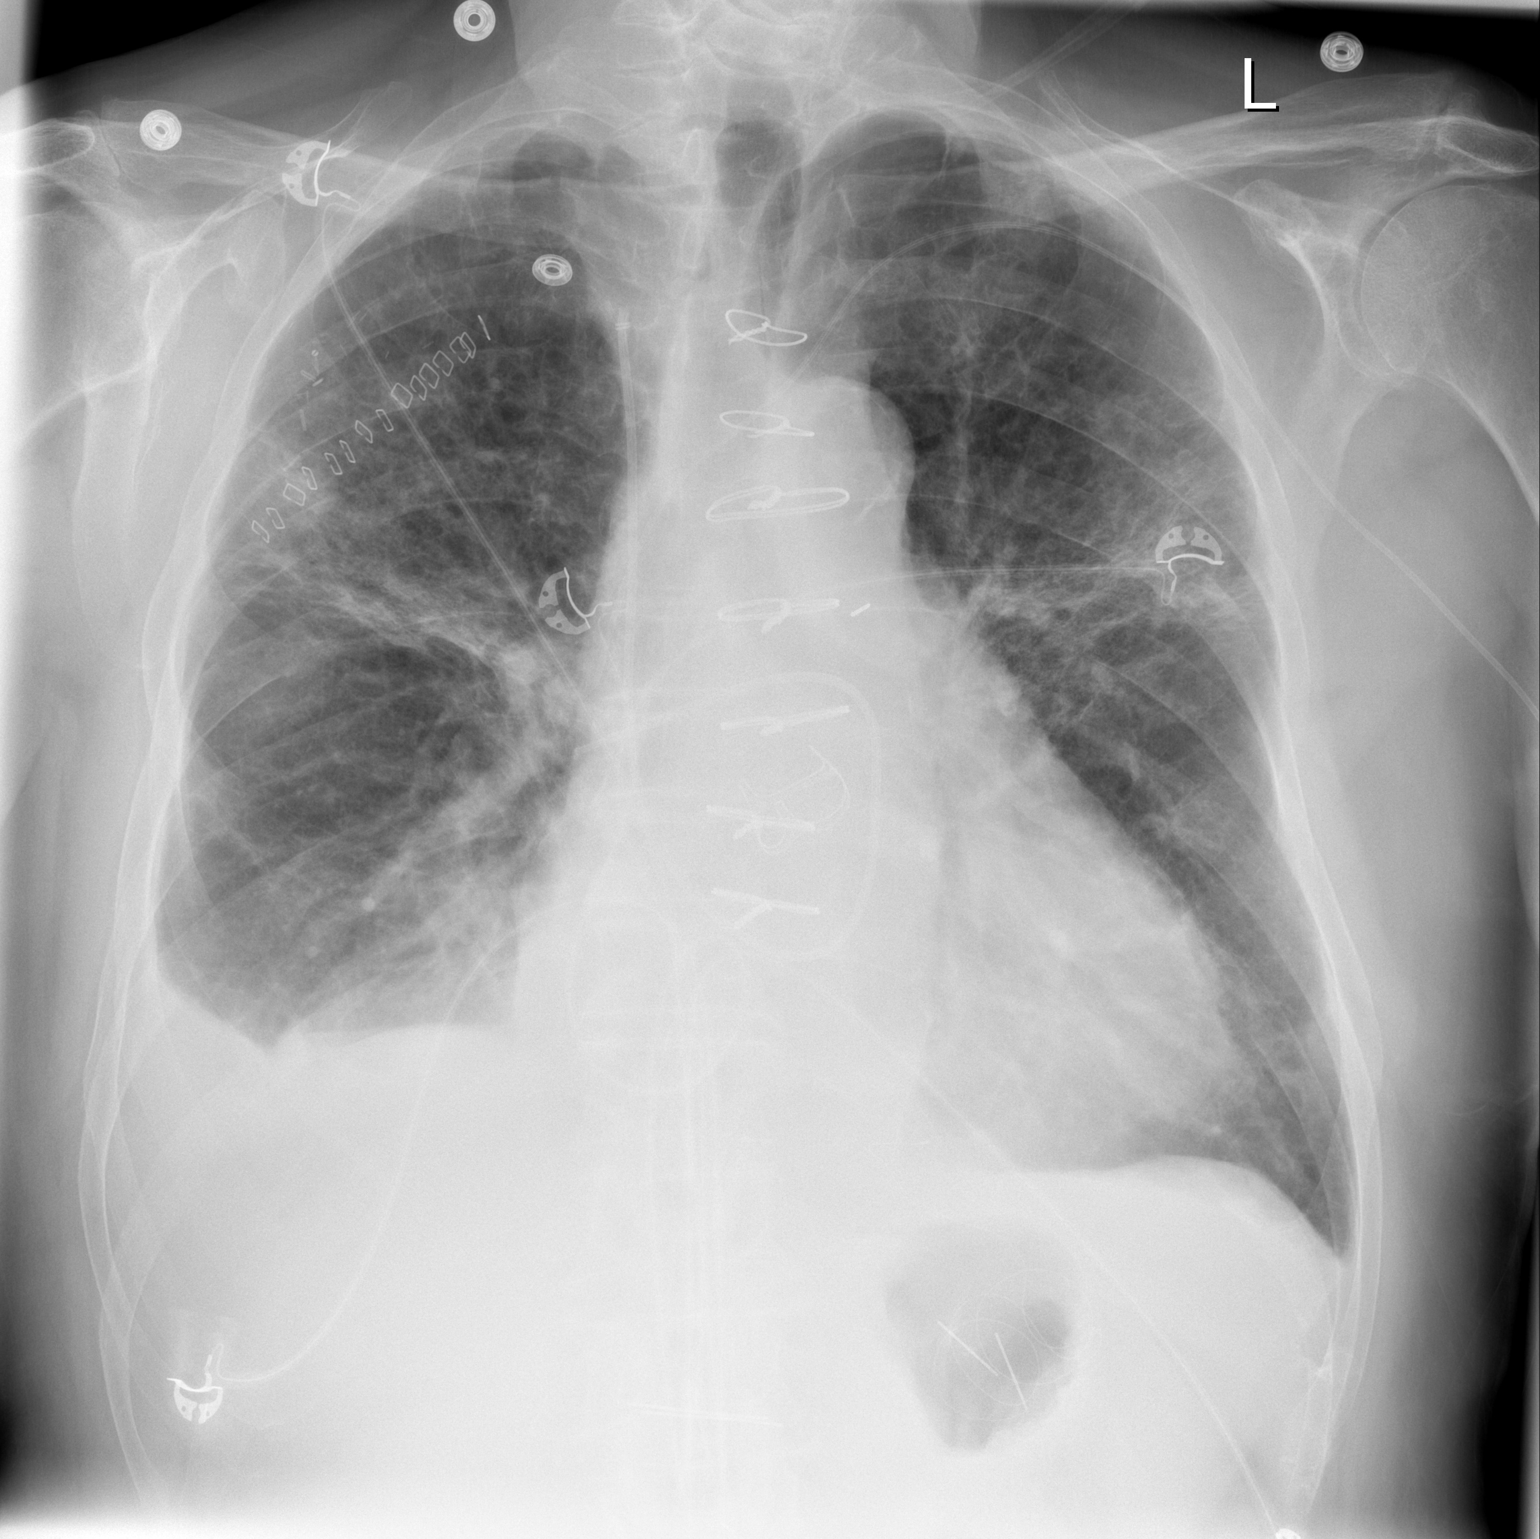

[w chest lat]
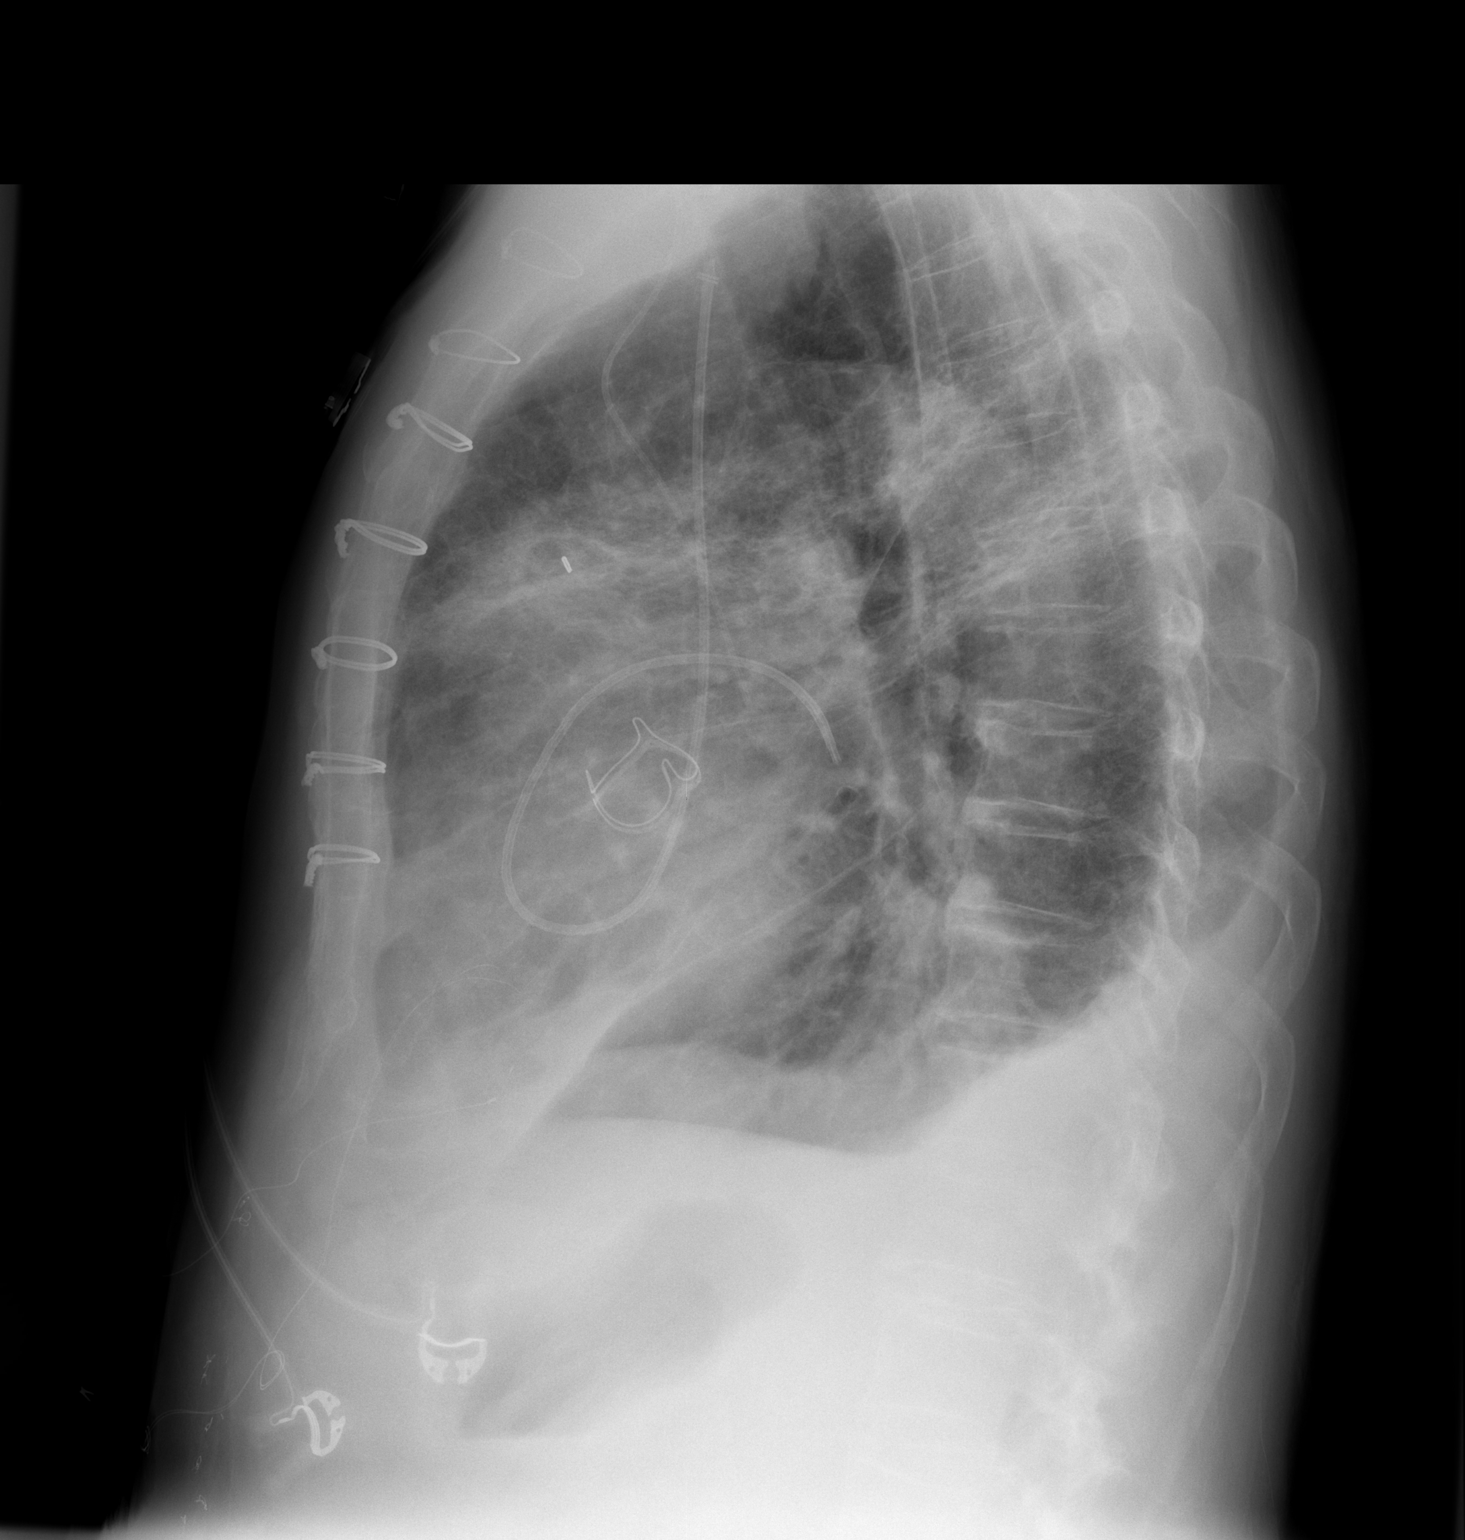

[2 of 2 positions shown; findings below may reference images not displayed]

FINDINGS: Enlargement of cardiac silhouette post median sternotomy and AVR.

With left arm PICC line tip projects over SVC.

Swan-Ganz catheter fragment again identified with tip projecting
over right pulmonary artery at hilum.

Enlargement of cardiac silhouette post median sternotomy and AVR.

Pulmonary vascular congestion.

Atherosclerotic calcification aorta.

Underlying COPD changes with superimposed bilateral per lobe
infiltrate and minimal atelectasis.

Persistent right basilar pleural effusion atelectasis, slightly
increased.

No pneumothorax.
IMPRESSION: Slightly increased right pleural effusion and basilar atelectasis.

Persistent upper lobe infiltrates and minimal atelectasis.

## 2014-07-13 IMAGING — CR DG CHEST 1V PORT SAME DAY
1 series · 1 of 1 positions shown · non-contrast
Comparison: PA and lateral chest x-ray December 22, 2012.

CLINICAL DATA: Known right pleural effusion.

EXAM:
PORTABLE CHEST - 1 VIEW SAME DAY

[AP]
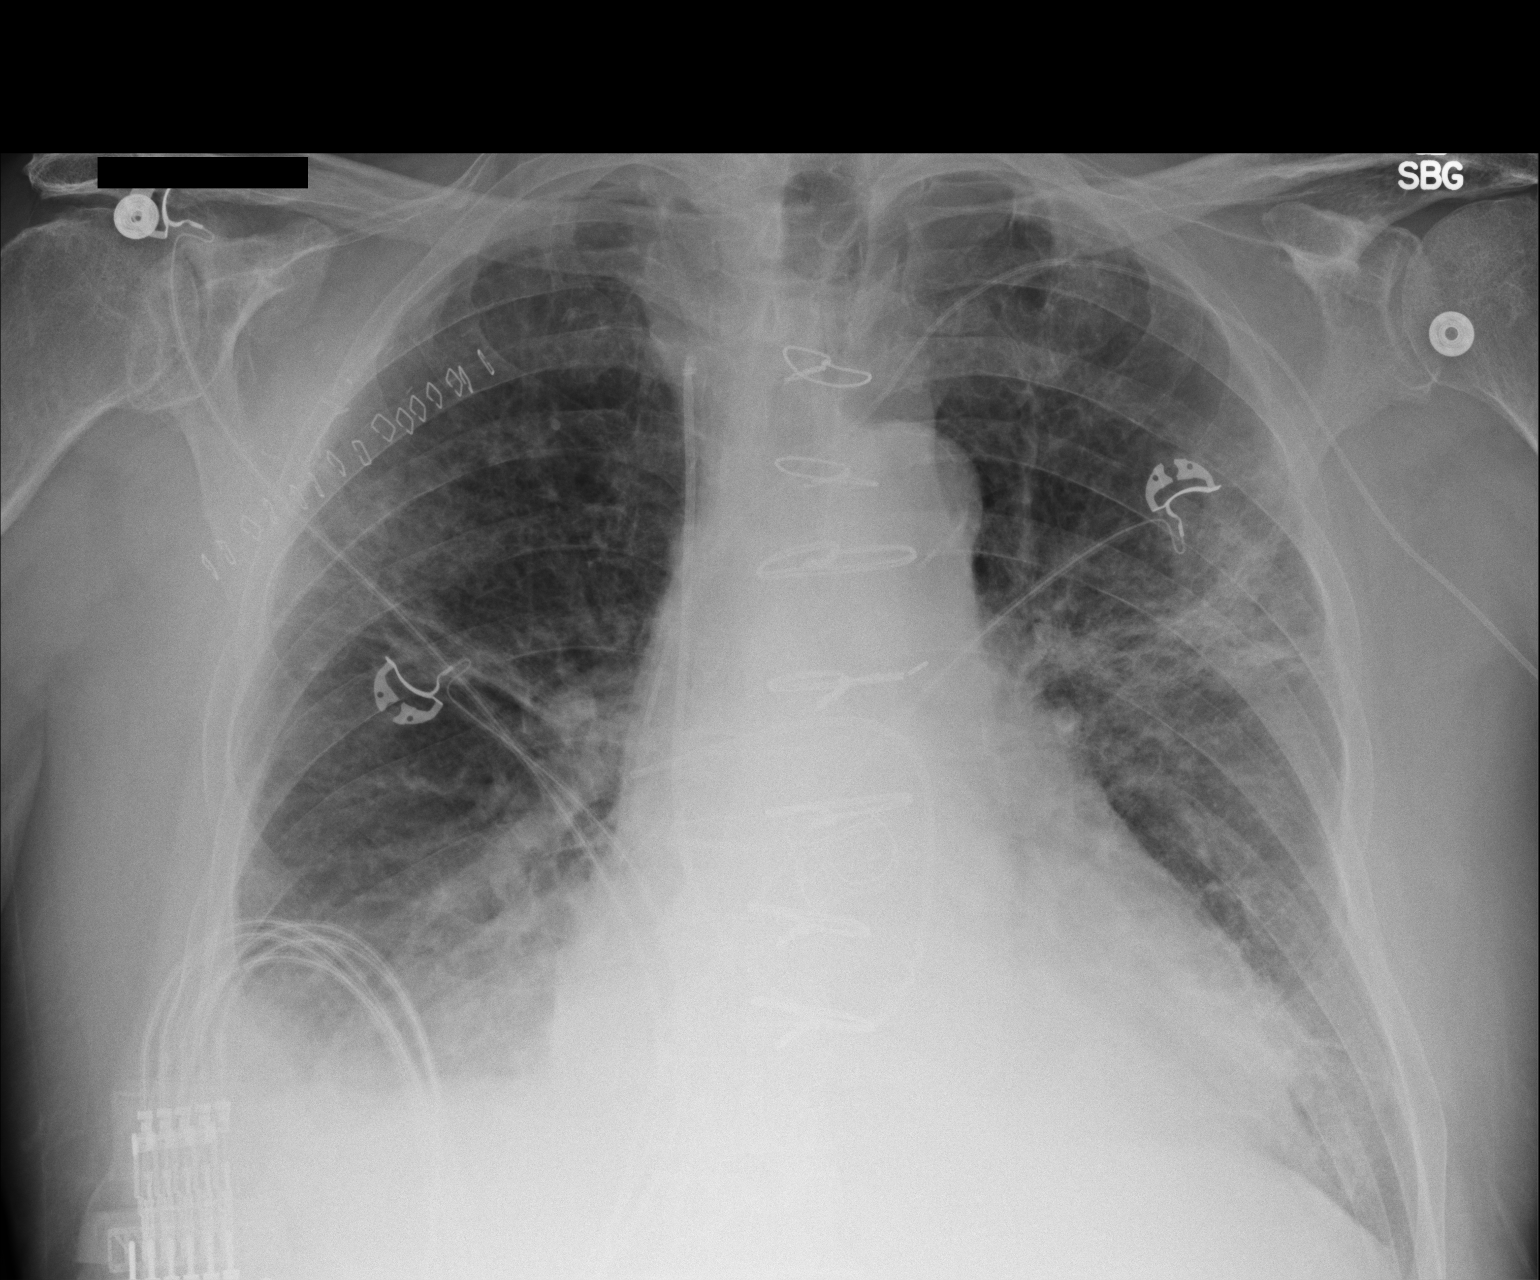

[1 of 1 positions shown; findings below may reference images not displayed]

FINDINGS: The lungs are well-expanded. There is persistent increased density
at the right lung base which has not increased in size. The
pulmonary interstitial markings remain increased bilaterally.
Confluent density in the right mid lung has improved. On the left
there remains patchy confluent density in the mid lung peripherally.
The cardiopericardial silhouette remains mildly enlarged. The
Mavua type catheter tip lies in the region of the distal right
main pulmonary artery. The left sided PICC line tip lies in the
region of the midportion of the SVC. There appears to been interval
removal of the mediastinal drain.
IMPRESSION: 1. There is a stable appearance of the small right-sided pleural
effusion.
2. The pulmonary interstitial markings remain mildly increased
bilaterally but have improved somewhat. There is persistent
increased density in the left mid lung peripherally and there is
decreased density on the right in the mid lung adjacent to the minor
fissure consistent with rib resolving atelectasis or pneumonia.
3. The cardiopericardial silhouette remains mildly enlarged.
Positioning of the Swan-Ganz catheter tip appears unchanged.

## 2014-08-07 IMAGING — CR DG CHEST 2V
2 series · 2 of 2 positions shown · non-contrast
Comparison: 12/23/2012

CLINICAL DATA: Status post cardiac surgery.

EXAM:
CHEST  2 VIEW

[w chest pa]
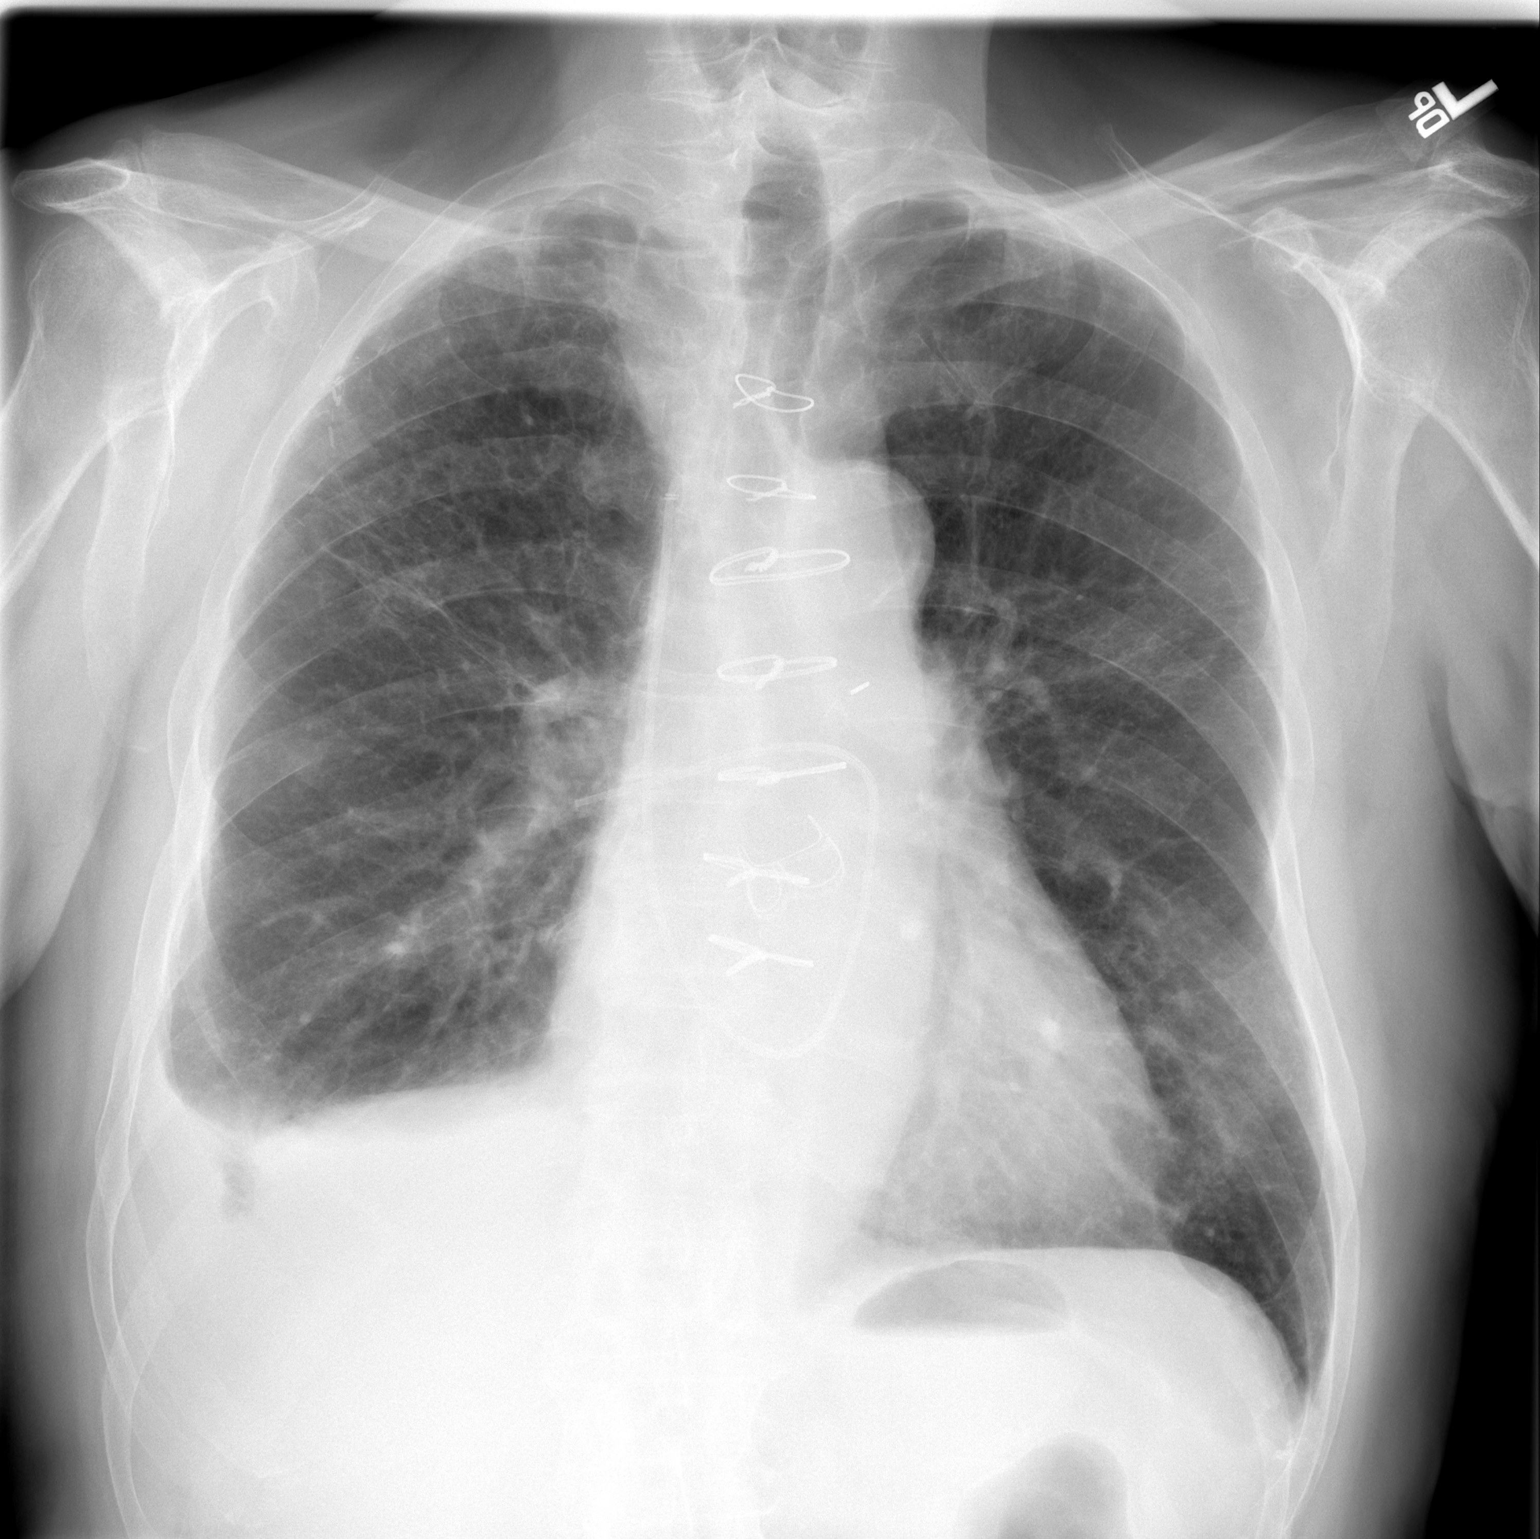

[w chest lat]
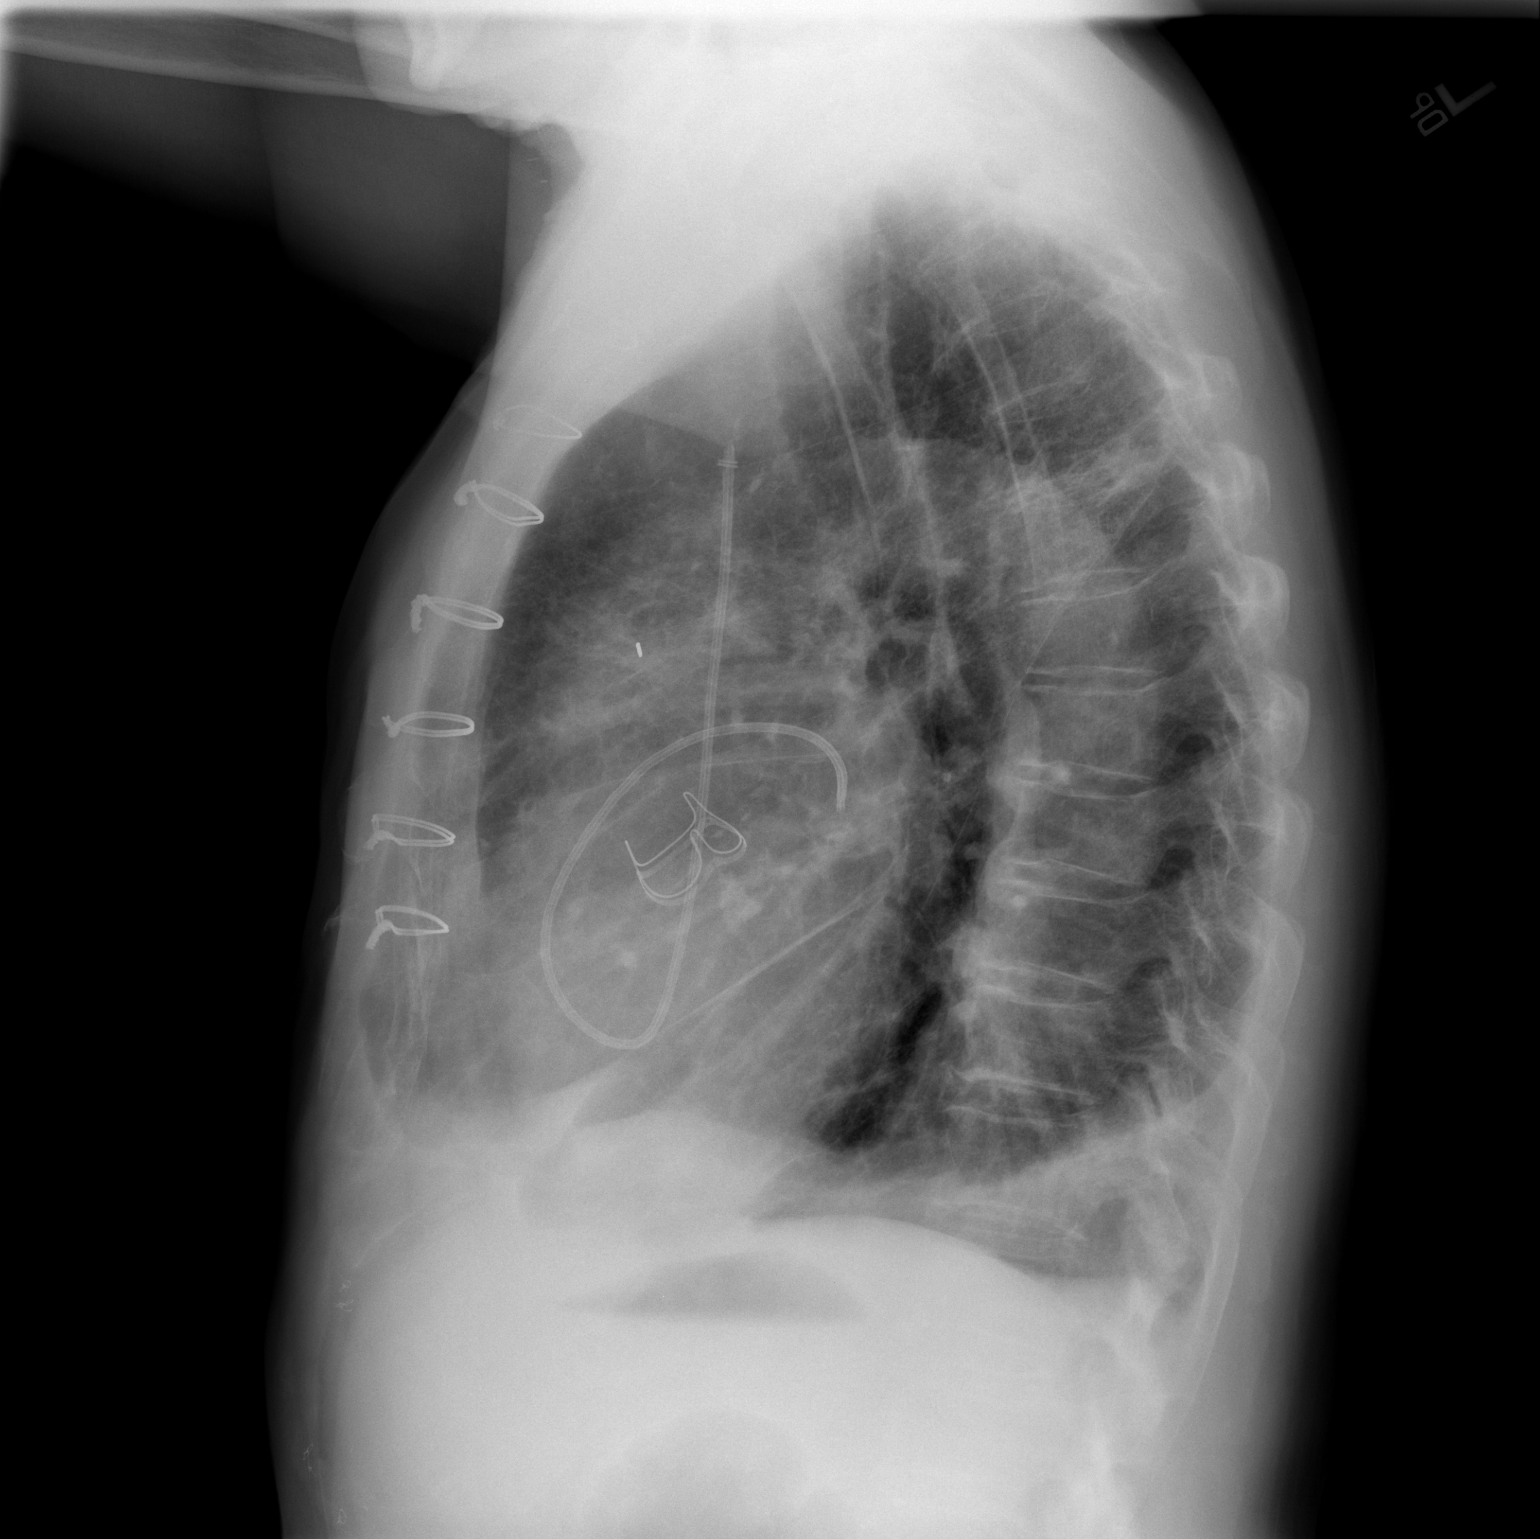

[2 of 2 positions shown; findings below may reference images not displayed]

FINDINGS: The cardiac silhouette is enlarged. Patient is status post median
sternotomy and aortic valve replacement. A Swan-Ganz catheter tip
projects within the distal aspect of the right pulmonary artery
unchanged when compared to previous study. There is been interval
removal of the patient's left-sided central venous catheter.
Decreased conspicuity of the interstitial markings. A small right
pleural effusion is appreciated. No new focal regions of
consolidation or new focal infiltrates. The osseous structures are
unremarkable. Atherosclerotic calcifications identified within the
aorta. The patient's skin staples have been removed from the right
hemi thorax.
IMPRESSION: Small right pleural effusion, slightly more prominent when compared
to previous study. Decrease in the patient's interstitial findings
likely reflecting improved pulmonary edema. Otherwise no new focal
regions of consolidation or infiltrates.

## 2014-08-14 ENCOUNTER — Ambulatory Visit: Payer: Medicare PPO | Admitting: Family

## 2014-08-14 ENCOUNTER — Other Ambulatory Visit (HOSPITAL_COMMUNITY): Payer: Medicare PPO

## 2014-09-04 IMAGING — CR DG CHEST 2V
2 series · 2 of 2 positions shown · non-contrast
Comparison: 08/17/2012 and earlier.

CLINICAL DATA: 72-year-old male status post heart surgery. Cough
without shortness of Breath. Initial encounter.

EXAM:
CHEST  2 VIEW

[view not recorded (1 of 2)]
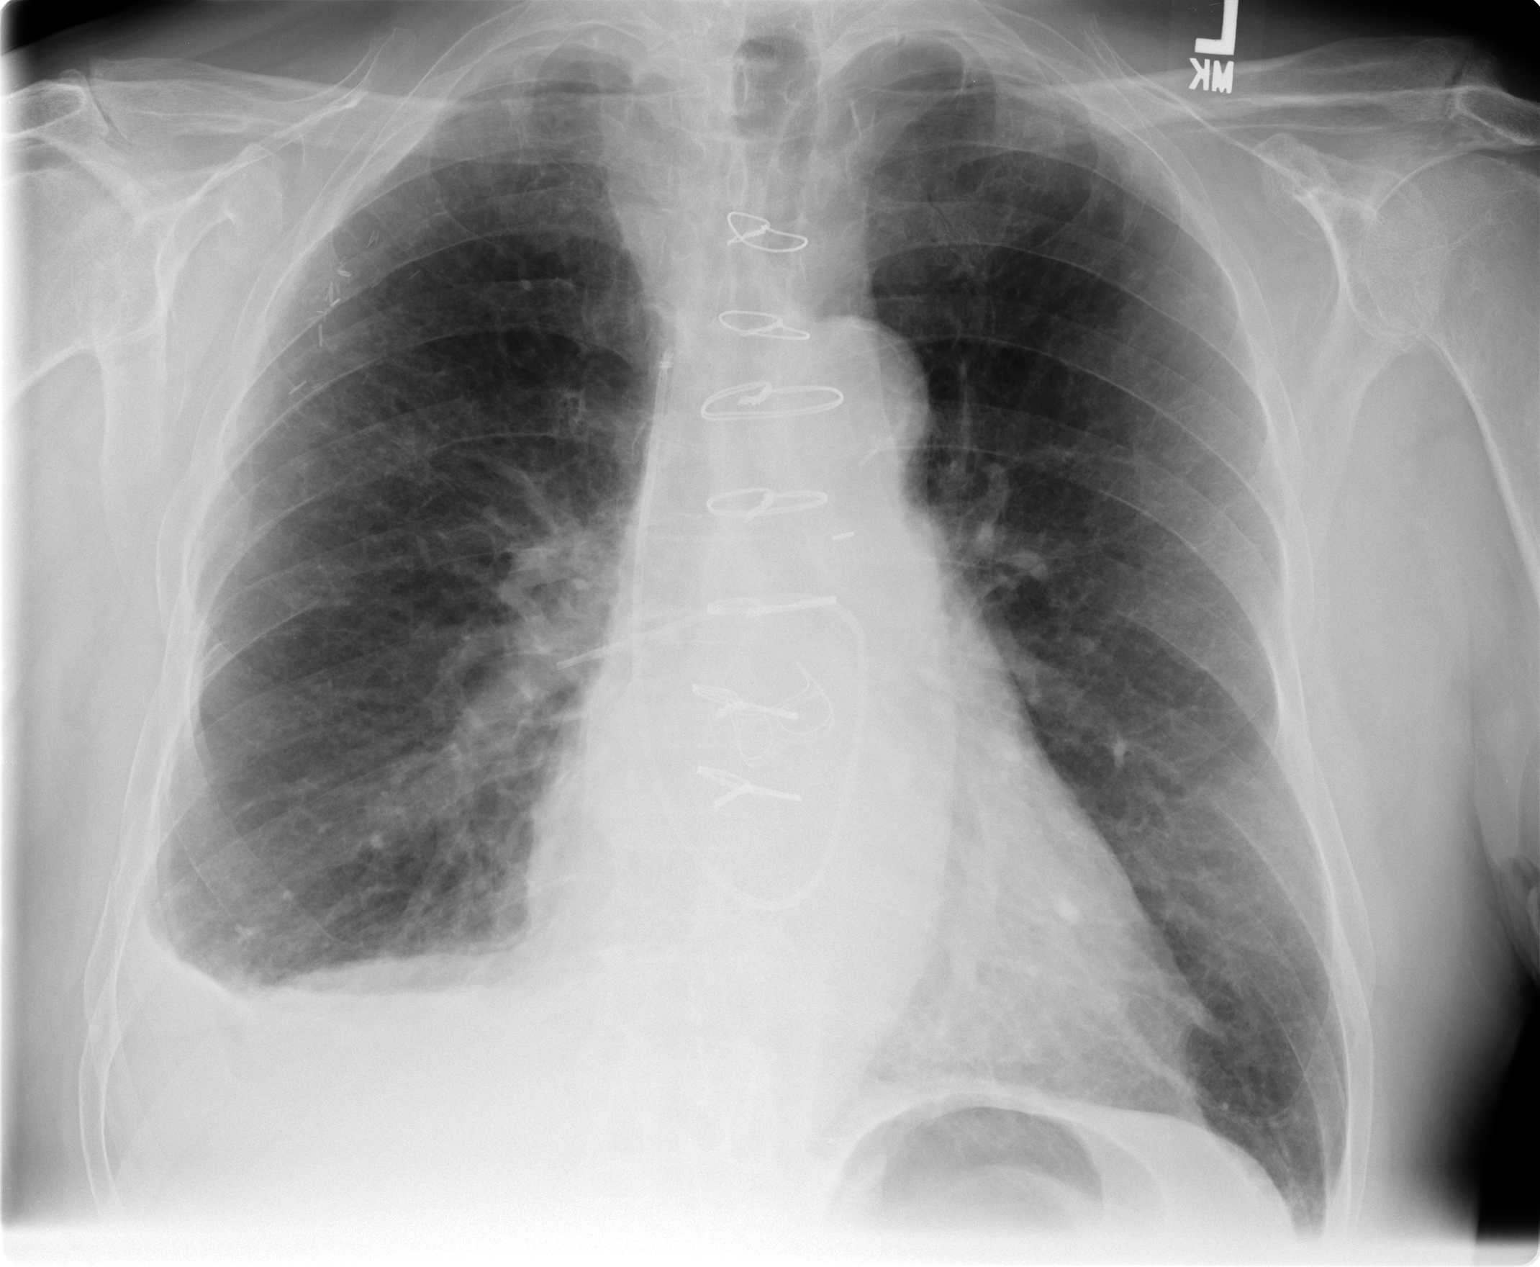

[view not recorded (2 of 2)]
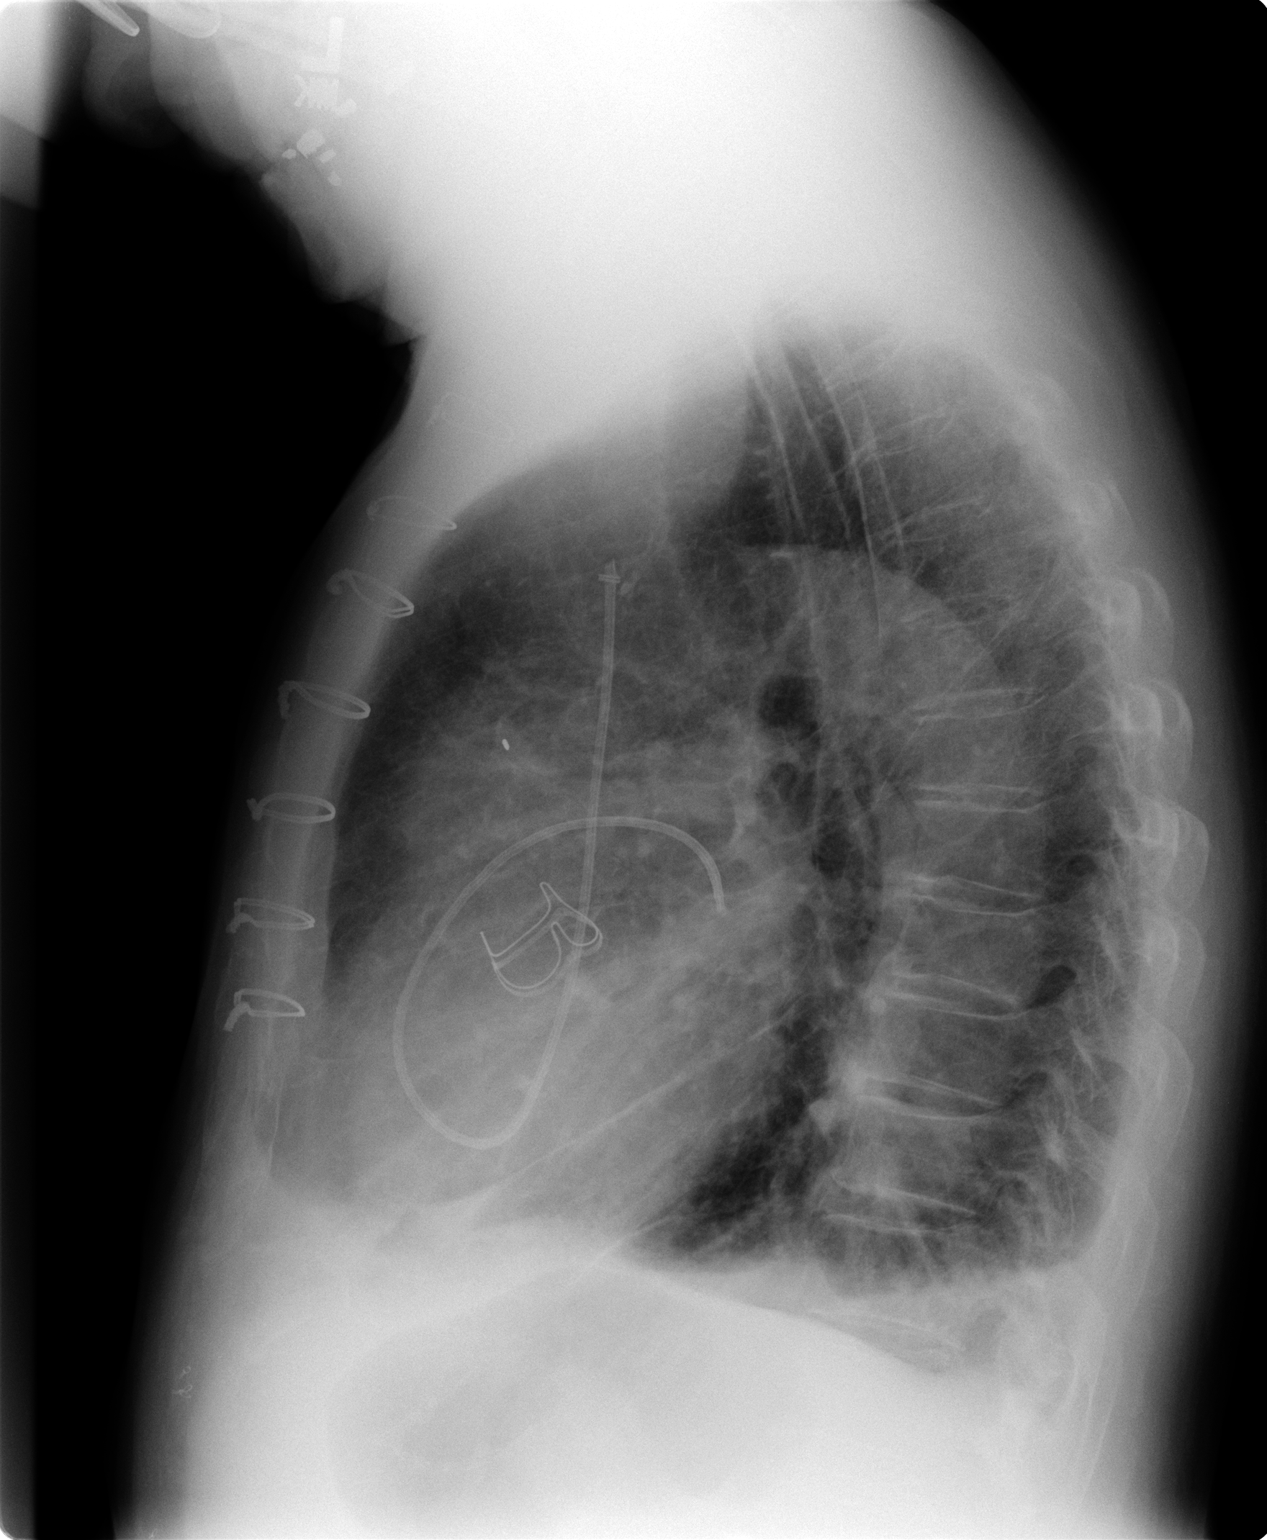

[2 of 2 positions shown; findings below may reference images not displayed]

FINDINGS: Persistent right pleural effusion, small. Stable hypo ventilation at
the right lung base.

Stable postoperative changes to the mediastinum including a
prosthetic cardiac valve and persistent catheter like device.

Stable cardiac size and mediastinal contours. No pneumothorax or
pulmonary edema. Stable postoperative changes to the right
axilla/chest wall. No acute osseous abnormality identified.
IMPRESSION: Stable postoperative appearance of the chest with persistent right
pleural effusion.

## 2014-10-16 IMAGING — CT CT CHEST W/O CM
2 of 3 series · 15 of 36 positions shown, 18 images · non-contrast
Comparison: none

[Series 4: thorax 5.0 i31f 1 · axial · 0.73mm/px · z∈[-353,-58]mm · 12 of 71 slices shown, 15 images]
[im 6/71  mediastinal]
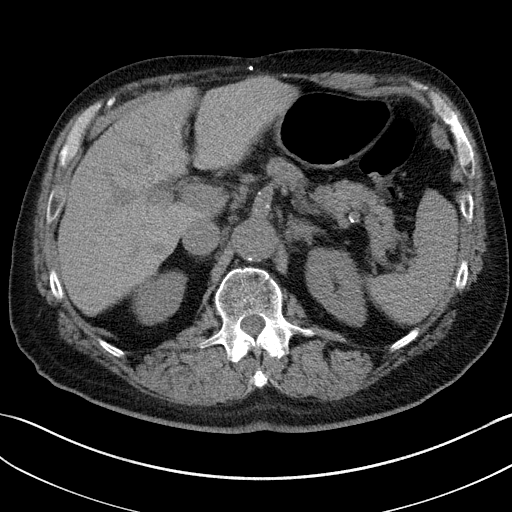
[im 6/71  lung]
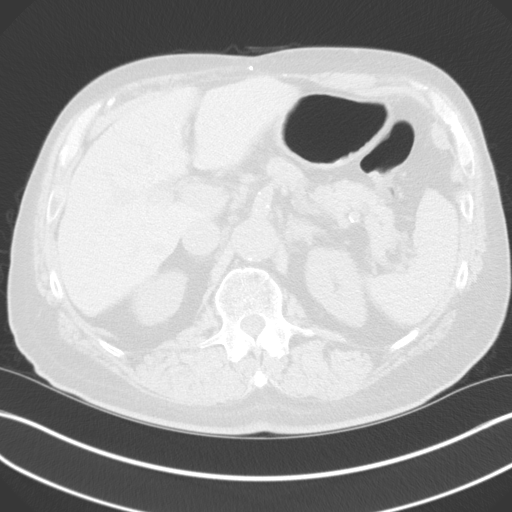
[im 11/71  lung]
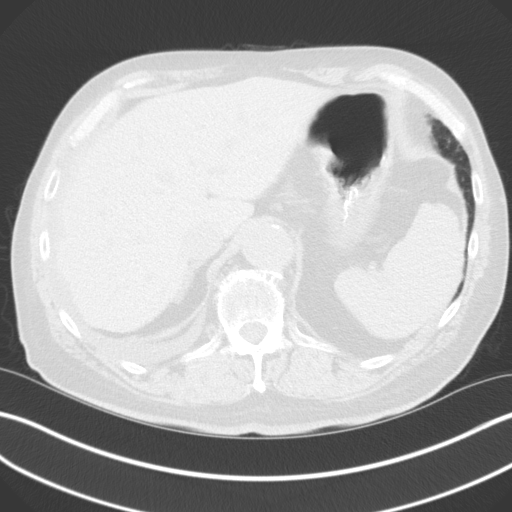
[im 16/71  lung]
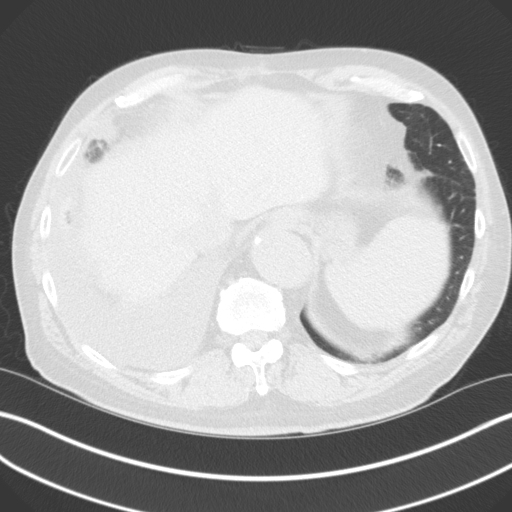
[im 21/71  lung]
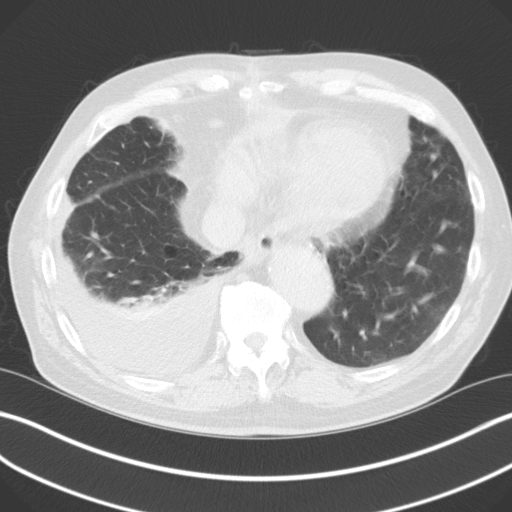
[im 26/71  mediastinal]
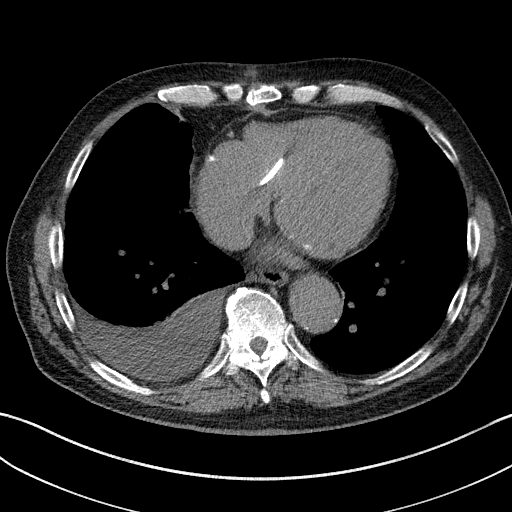
[im 26/71  lung]
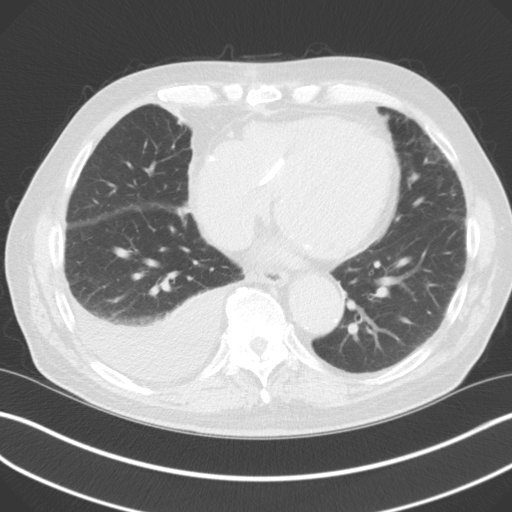
[im 32/71  lung]
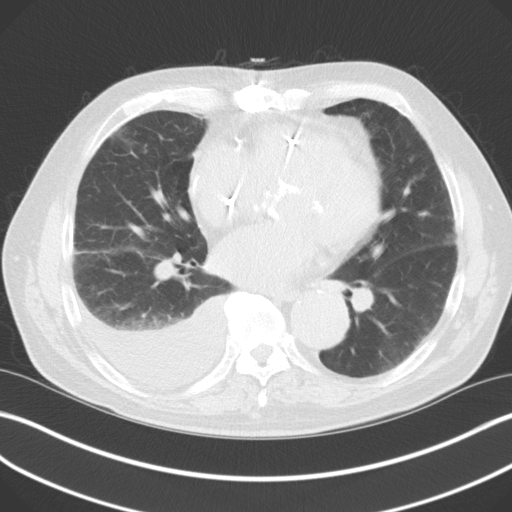
[im 39/71  lung]
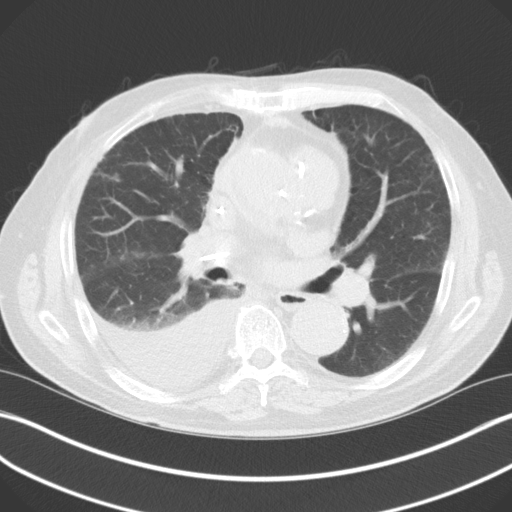
[im 45/71  lung]
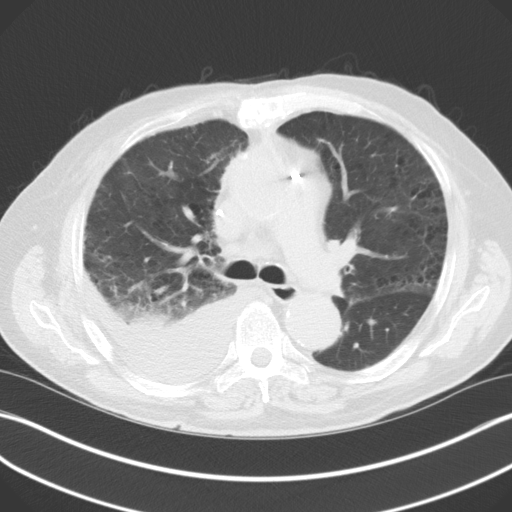
[im 50/71  mediastinal]
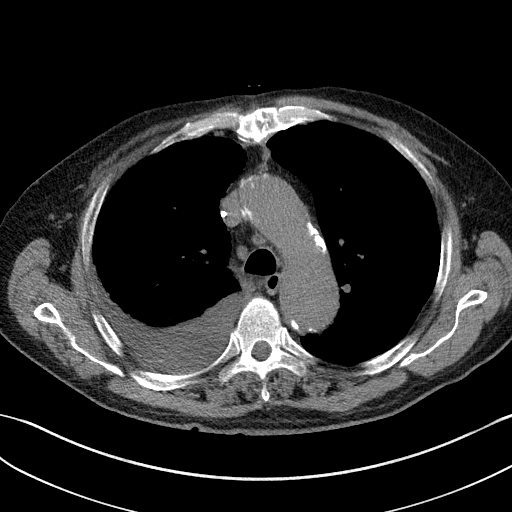
[im 50/71  lung]
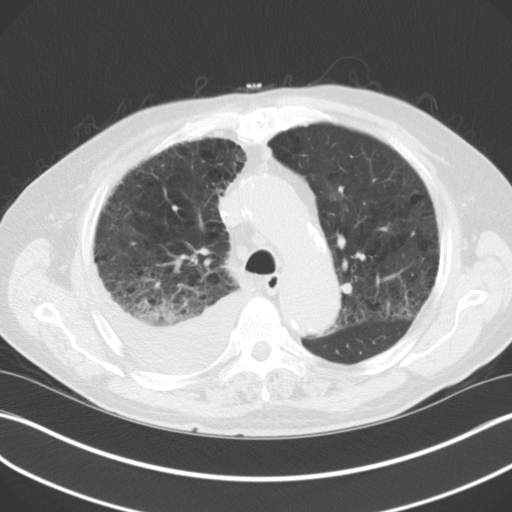
[im 55/71  lung]
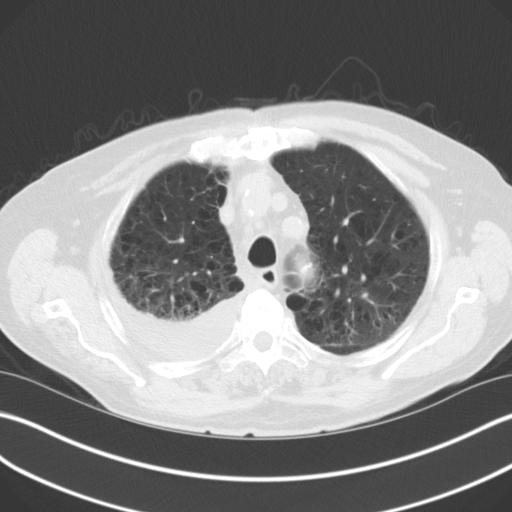
[im 60/71  lung]
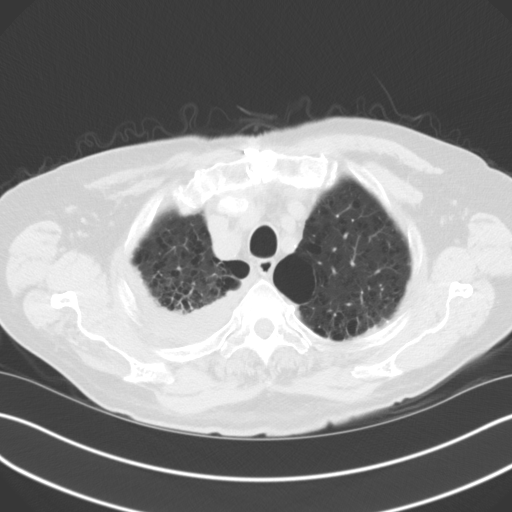
[im 65/71  lung]
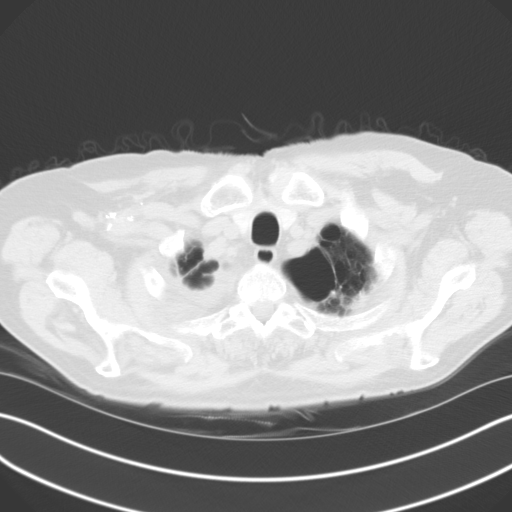

[Series 6: coronal · coronal · 0.69mm/px · 3 of 96 slices shown]
[im 20/96  lung]
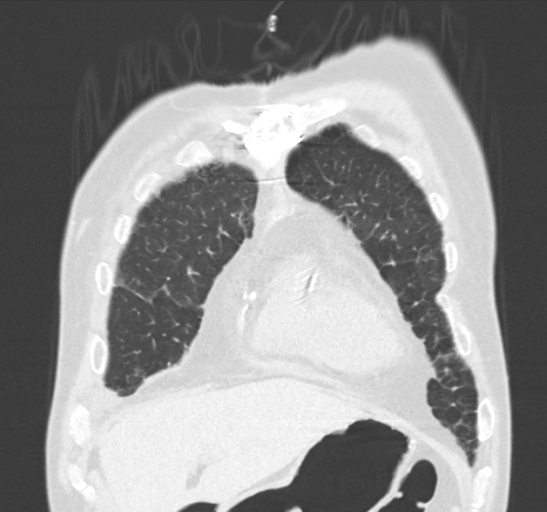
[im 39/96  lung]
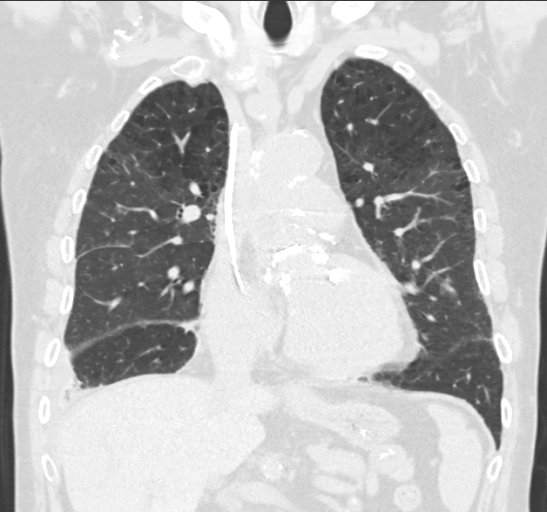
[im 58/96  lung]
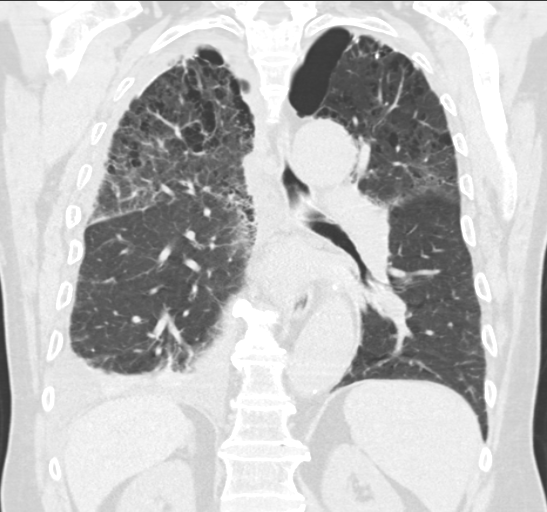

[15 of 36 positions shown; findings below may reference images not displayed]

CLINICAL DATA
Pleural effusion.  Retained Swan-Ganz catheter fragment.

EXAM
CT CHEST WITHOUT CONTRAST

TECHNIQUE
Multidetector CT imaging of the chest was performed following the
standard protocol without IV contrast.

COMPARISON
DG CHEST 2 VIEW dated 03/14/2013; DG CHEST 2 VIEW dated 02/14/2013; DG
CHEST 2 VIEW dated 01/17/2013; CT ANGIO CHEST AORTA W/CM &/OR
WO/CM dated 08/02/2012. Progress note 03/14/2013 by Dr. Ramayan Sttar is
reviewed in [REDACTED].

FINDINGS
Mediastinal lymph nodes are generally subcentimeter in short axis
size. Lower right paratracheal lymph node measures 2.0 cm and does
appear to have internal fat (image 24). Hilar regions are difficult
to definitively evaluate without IV contrast but appear grossly
unremarkable. No axillary adenopathy. Surgical clips in the right
axillary region.

A retained catheter fragment is seen extending from the right
brachiocephalic vein junction into the distal right pulmonary
artery. Postoperative changes of ascending aortic aneurysm repair
and valve replacement. Atherosclerotic calcification of the arterial
vasculature. Heart is mildly enlarged. No pericardial effusion.

Small to moderate right pleural effusion, simple in appearance. Mild
compressive atelectasis in the right lower lobe. Spiculated nodule
in the right upper lobe measures 6 mm (5 x 7 mm), image 15, stable
from 08/02/2012. Centrilobular emphysema with bullous changes in the
apices, left greater than right. Suspect mild superimposed upper
lung zone predominant subpleural reticulation and ground-glass, new
or progressive from 08/02/2012. There is debris in the bronchus
intermedius.

Incidental imaging of the upper abdomen shows the visualized
portions of the liver and right adrenal gland to be grossly
unremarkable. Left adrenal thickening is unchanged. Stone is seen in
the upper pole right kidney. Visualized portions of the kidneys,
spleen, pancreas, stomach and bowel are otherwise grossly
unremarkable. No upper abdominal adenopathy. No worrisome lytic or
sclerotic lesions. Degenerative changes are seen in the spine.

IMPRESSION
1. Retained Swan-Ganz catheter fragment, as given in the clinical
history, extends from the right brachiocephalic vein junction to the
distal right main pulmonary artery.
2. 6 mm spiculated right upper lobe nodule, stable from 08/02/2012.
Additional CT evaluation in 1 year is recommended to ensure
stability as adenocarcinoma can have this appearance.
3. Postoperative changes of ascending aortic aneurysm repair and
valve replacement. This exam will serve as a baseline for future
comparison.
4. Small to moderate right pleural effusion, simple in appearance,
with mild compressive atelectasis in the right lower lobe.
5. Bullous emphysema with suspected superimposed subpleural
fibrosis, new or progressive from 08/02/2012. Amiodarone-related
drug toxicity not excluded.
6. Right renal stone.

SIGNATURE

## 2014-11-29 IMAGING — CR DG CHEST 2V
2 series · 2 of 2 positions shown · non-contrast
Comparison: CT CHEST W/O CM dated 03/28/2013; DG CHEST 2 VIEW dated
03/14/2013

CLINICAL DATA: Pre Cardiac Surgery

EXAM:
CHEST  2 VIEW

[w chest pa]
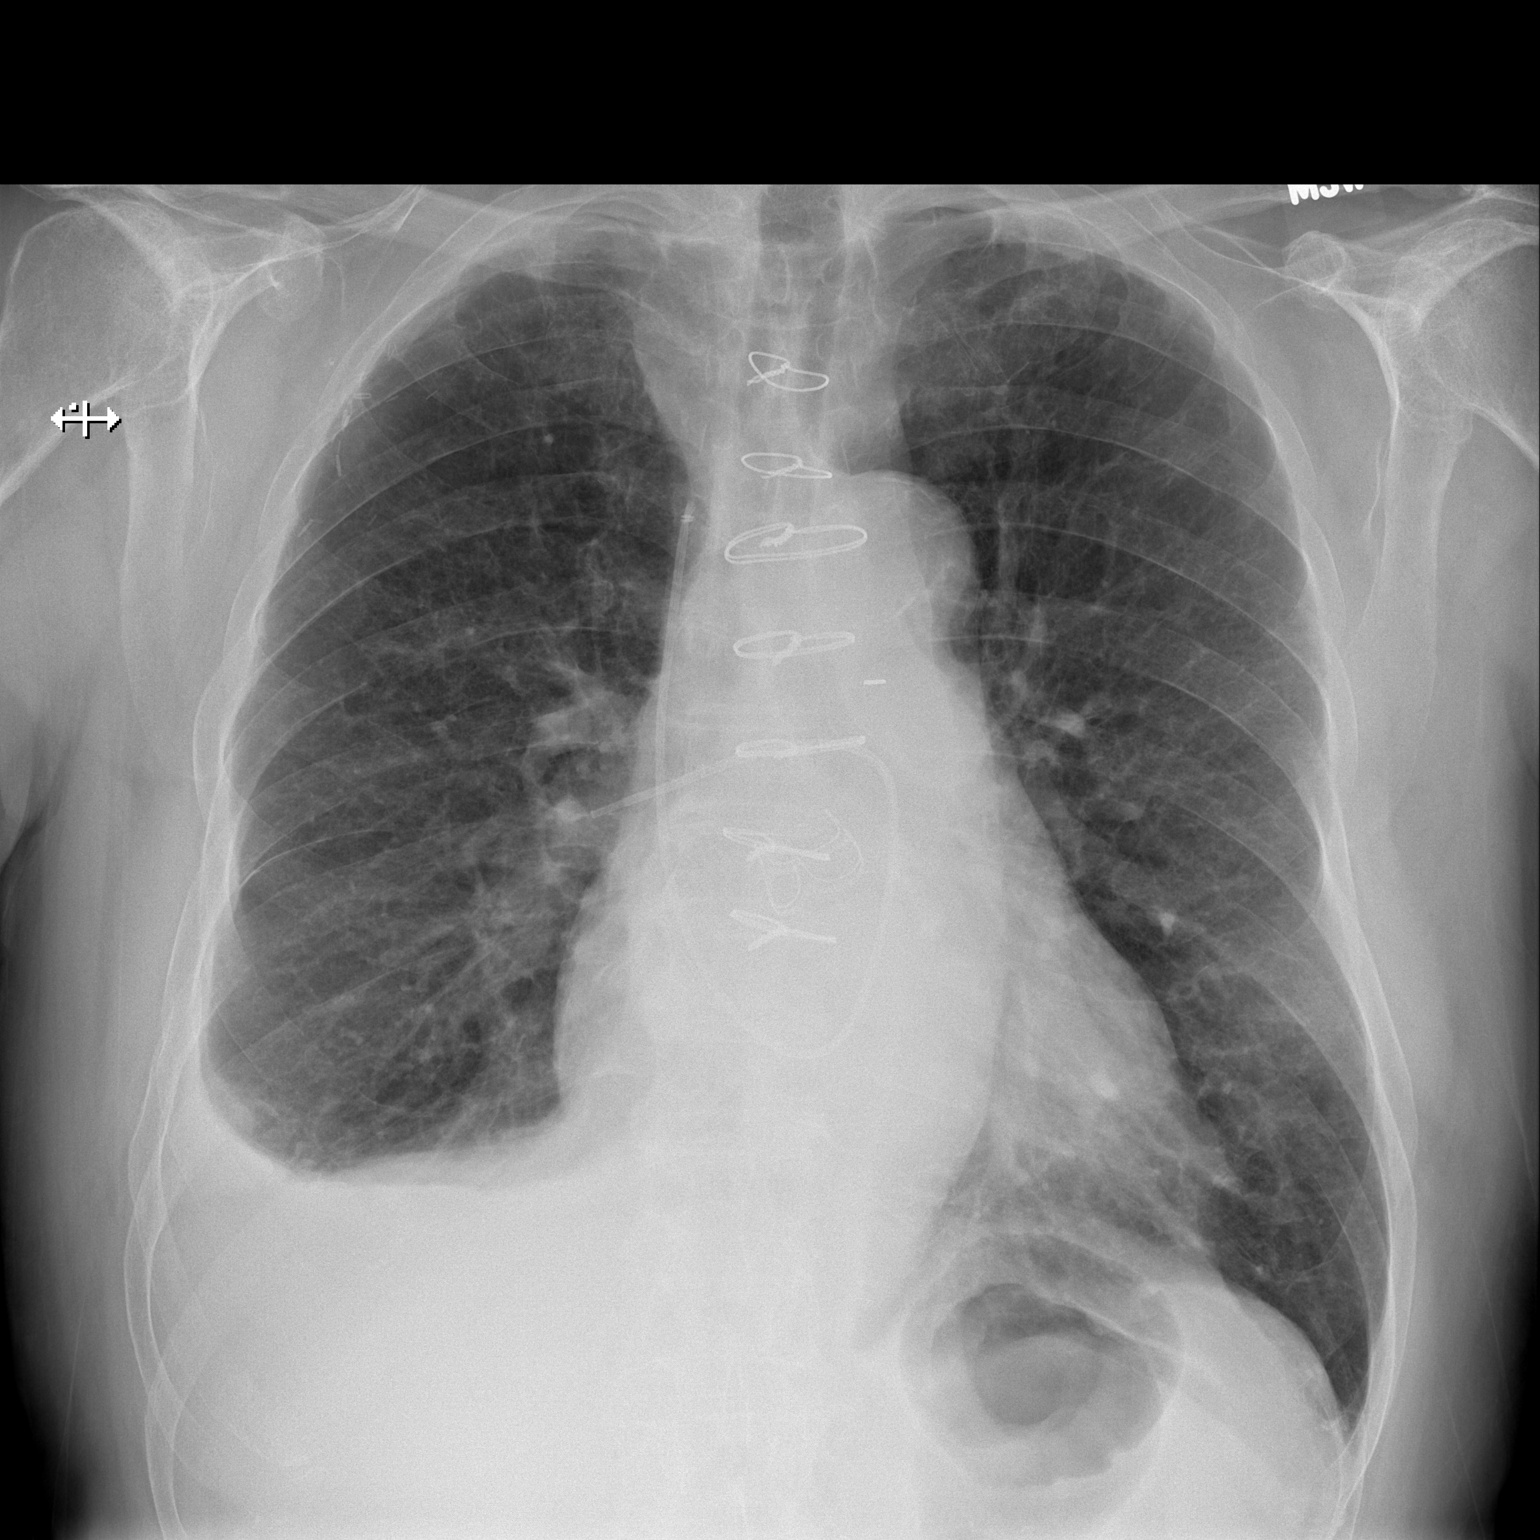

[w chest lat]
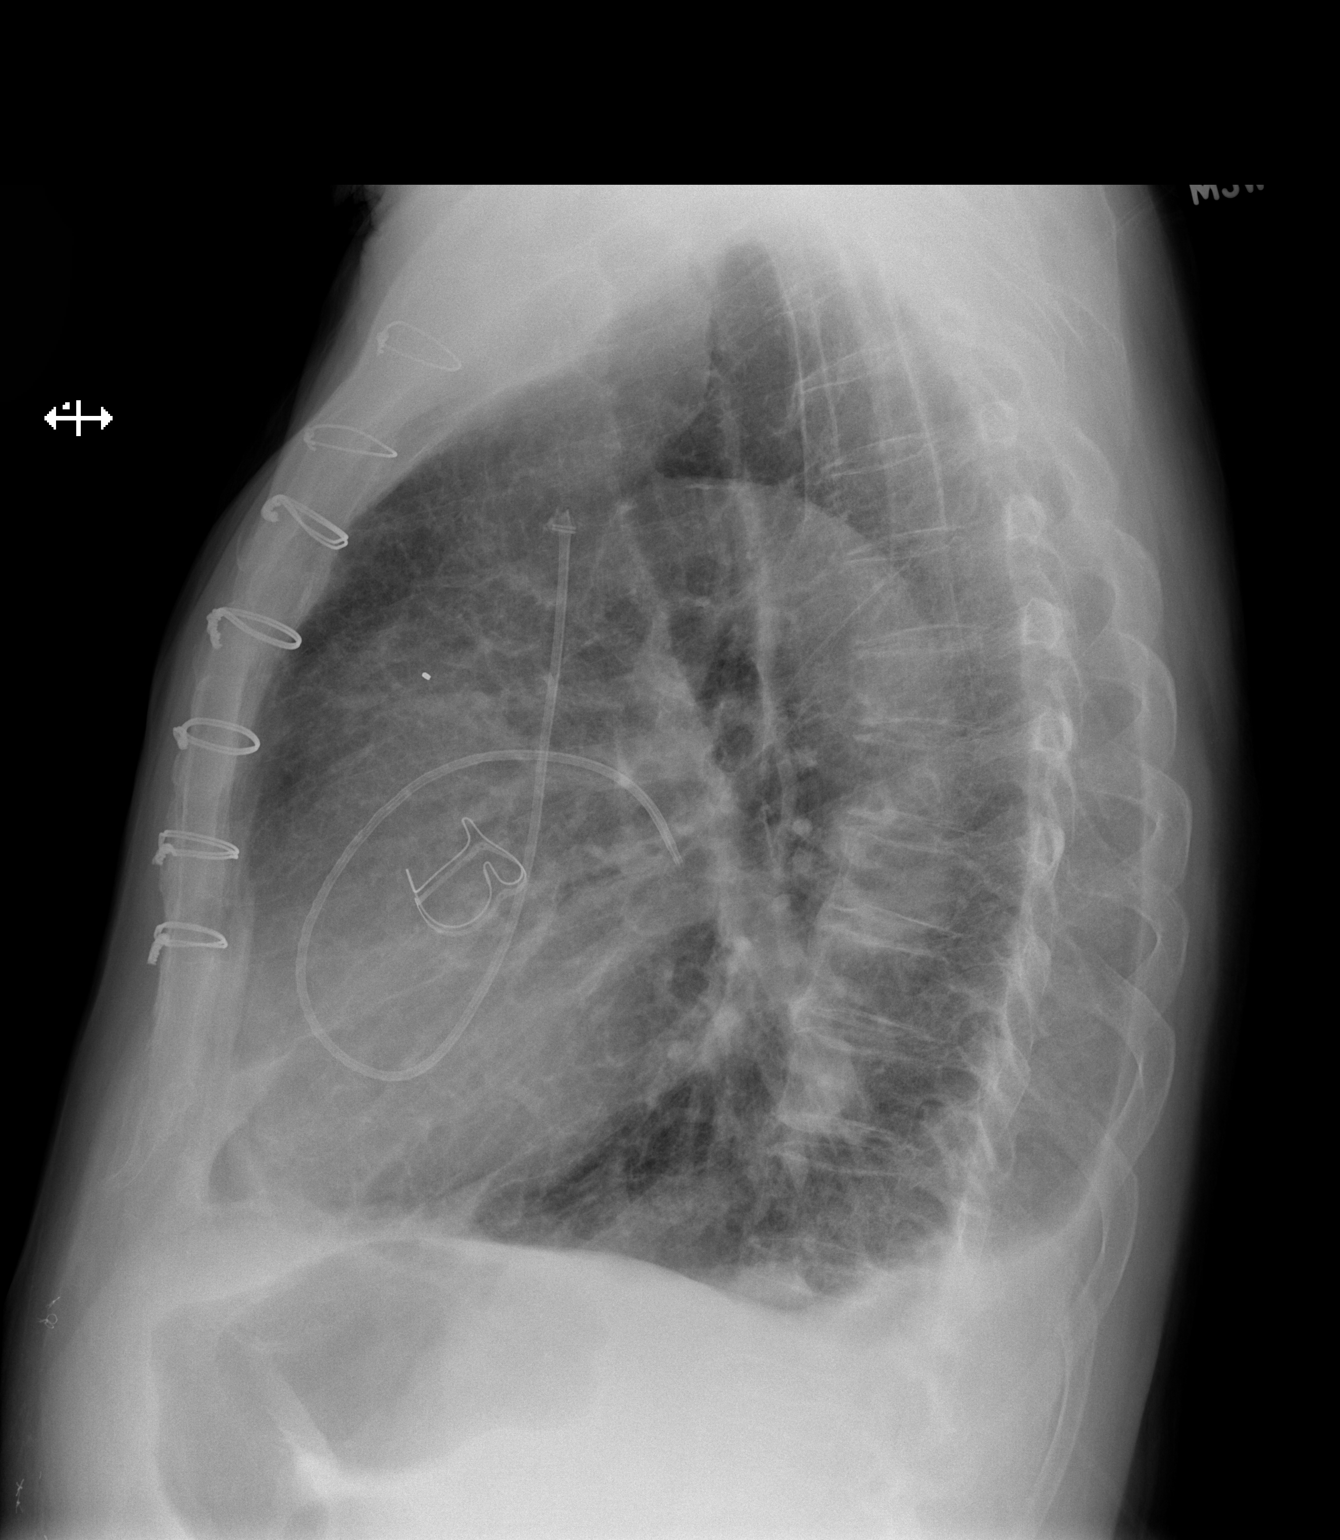

[2 of 2 positions shown; findings below may reference images not displayed]

FINDINGS: Stable small right pleural effusion. Cardiac silhouette is
moderately enlarged. The status post median sternotomy. Stable
prostatic aortic valve. A stable catheter projects in the region of
the mediastinum. No further focal regions of consolidation or
infiltrates. There is stable prominence of the interstitial
markings. Degenerative changes appreciated within the shoulders.
Atherosclerotic calcifications within the aorta.
IMPRESSION: Stable right pleural effusion. Otherwise no significant change in
the chest radiograph.

## 2014-12-03 IMAGING — CR DG CHEST 1V
1 series · 1 of 1 positions shown · non-contrast
Comparison: PA and lateral chest 05/11/2013.

CLINICAL DATA: Status post removal upper retained pulmonary artery
catheter.

EXAM:
CHEST - 1 VIEW

[AP]
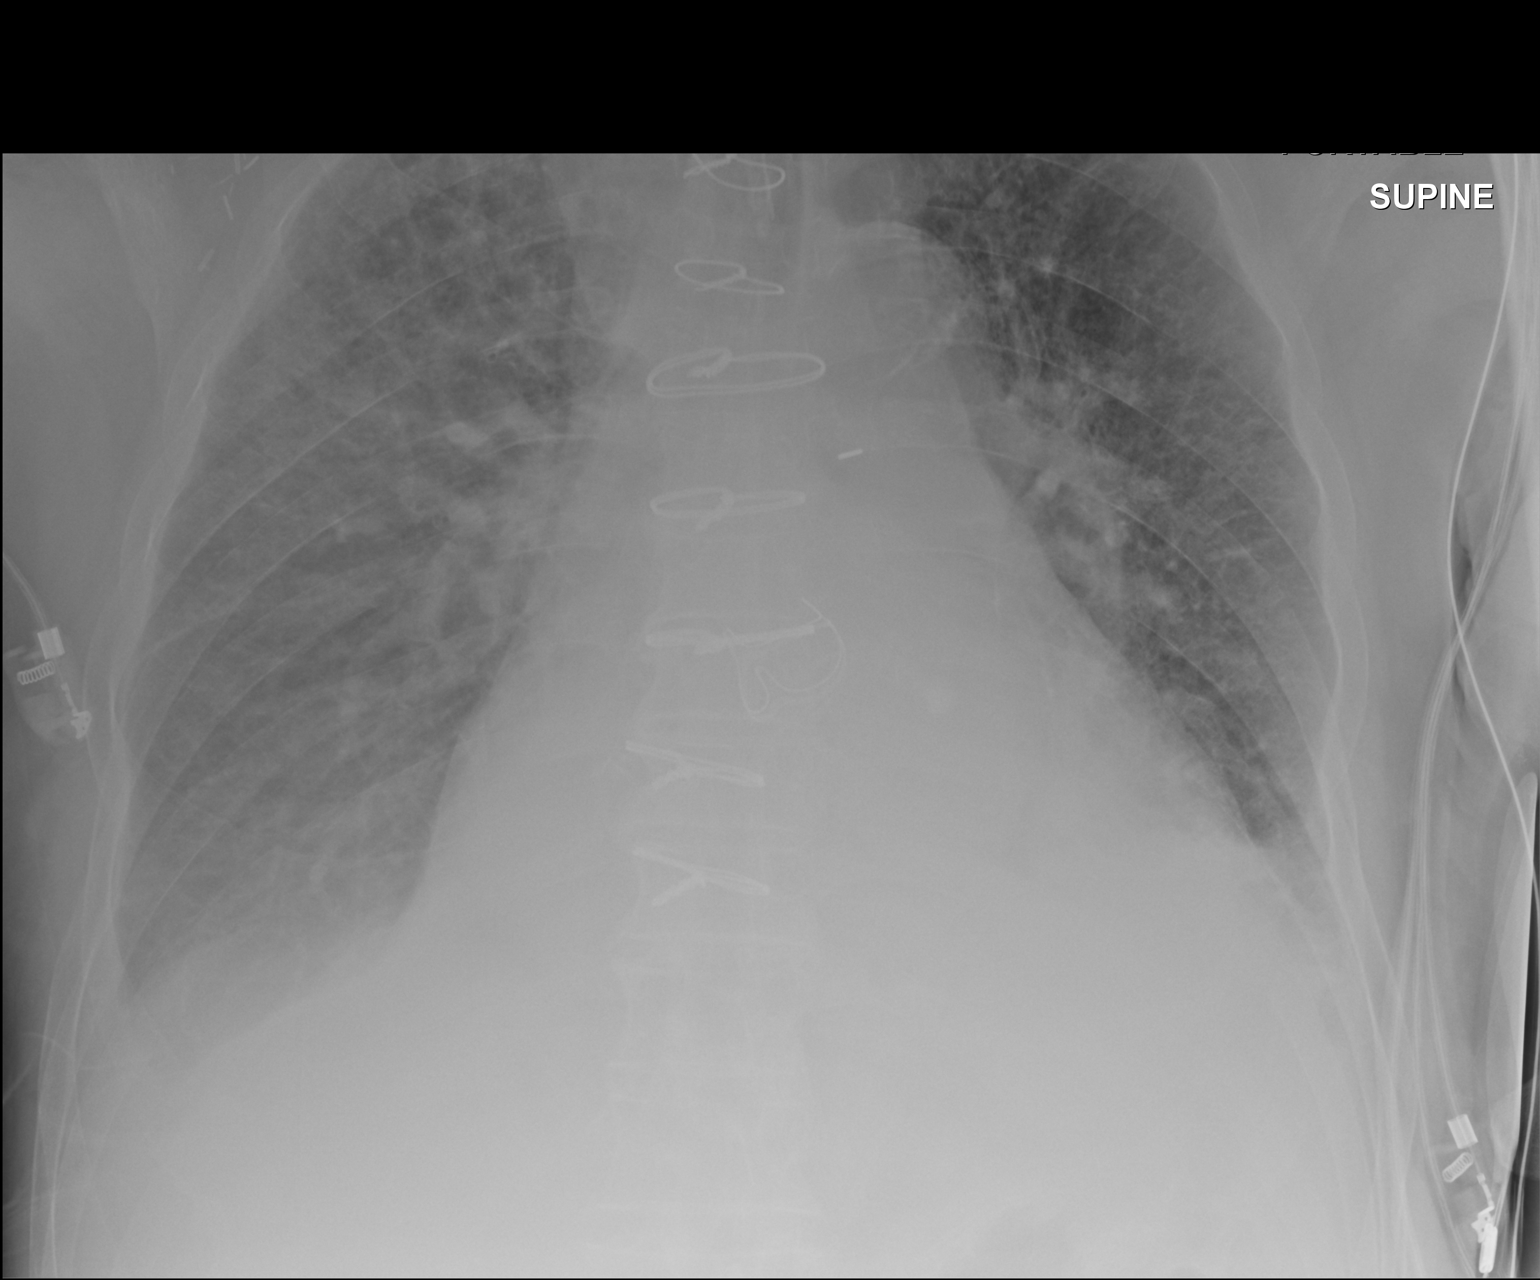

[1 of 1 positions shown; findings below may reference images not displayed]

FINDINGS: Pulmonary artery catheter seen on the prior study has been removed.
No retained catheter fragment is identified. Endotracheal tube is in
place with the tip projecting in good position just below the
clavicular heads.

There has been interval development of extensive bilateral airspace
disease and small effusions, greater on the right. Cardiomegaly is
noted. There is no pneumothorax. The patient is status post aortic
valve replacement.
IMPRESSION: Pulmonary artery catheter has been removed. No retained catheter
fragment is identified.

Bilateral airspace disease and effusions most consistent with
pulmonary edema. Cardiomegaly is noted.

## 2014-12-03 IMAGING — DX DG CHEST 1V PORT
1 series · 2 of 2 positions shown · non-contrast
Comparison: Single view of the chest 05/15/2013 at 8848 hr.

CLINICAL DATA: Postoperative film.

EXAM:
PORTABLE CHEST - 1 VIEW

[Series 1: portable · 0.17mm/px · 2 of 2 slices shown]
[im 1/2]
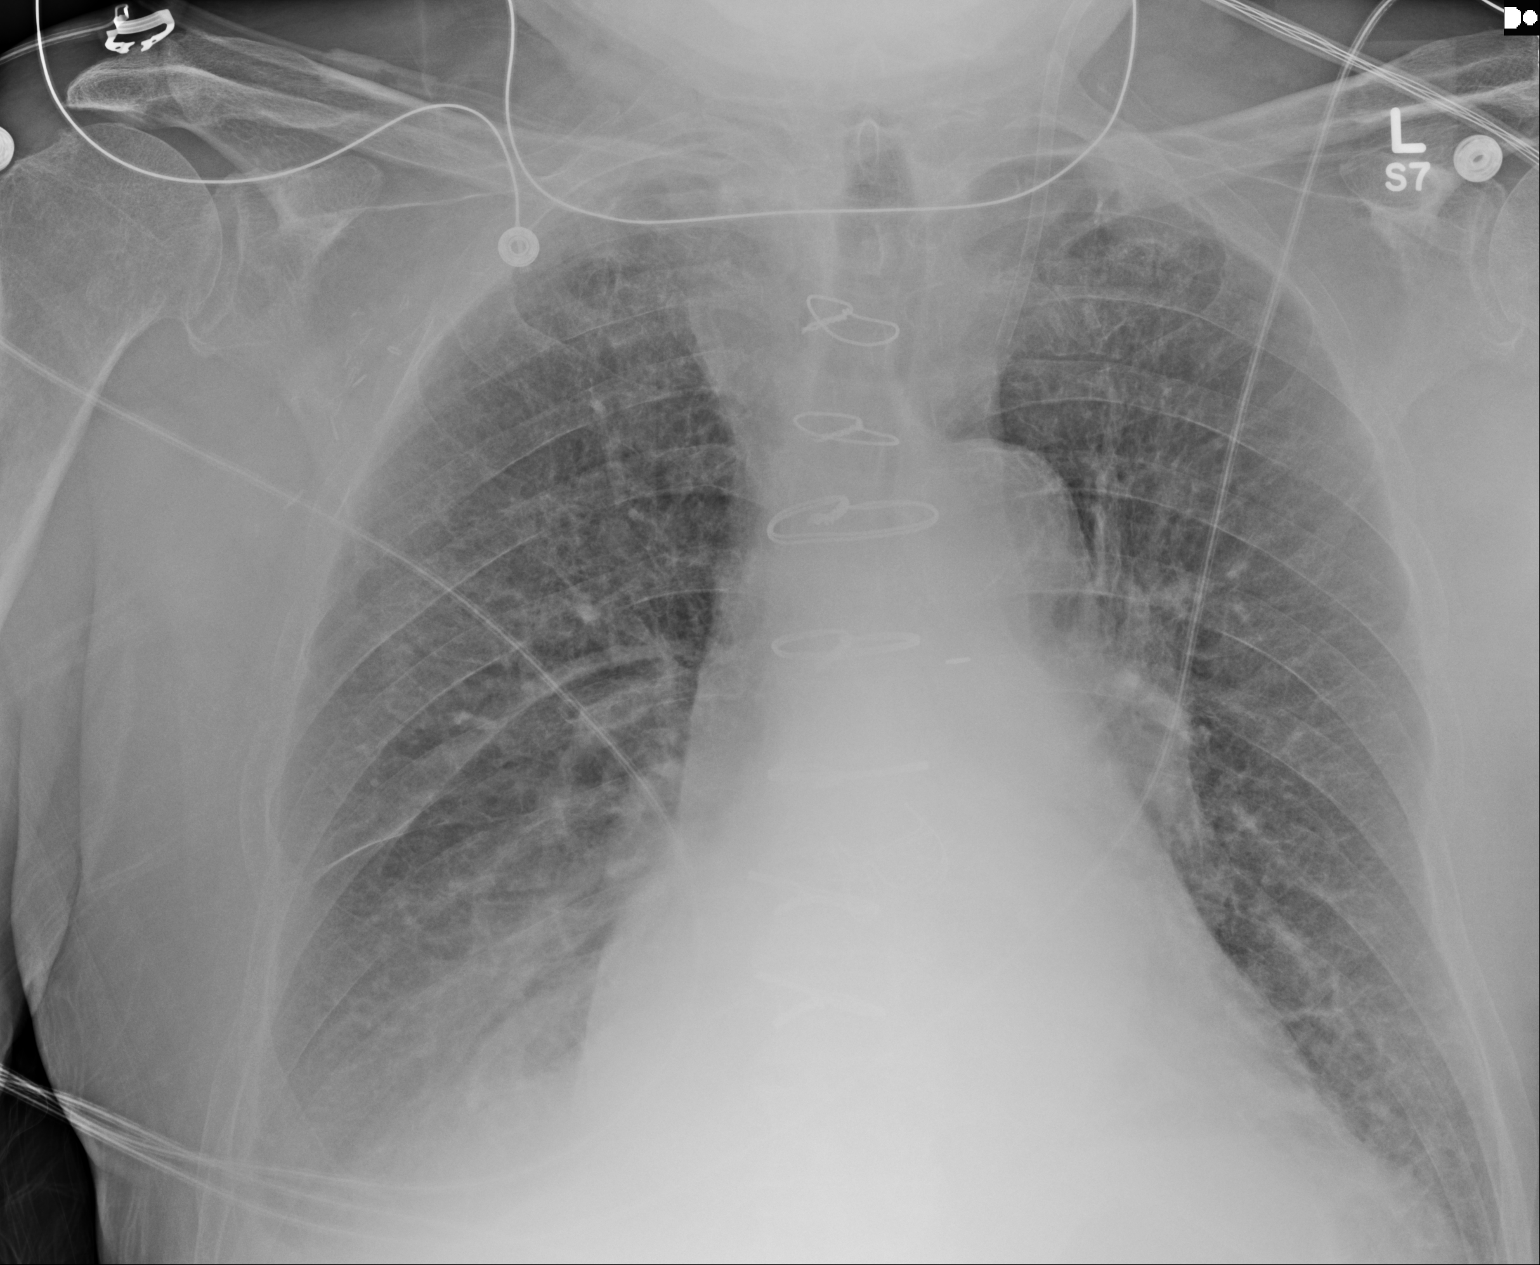
[im 2/2]
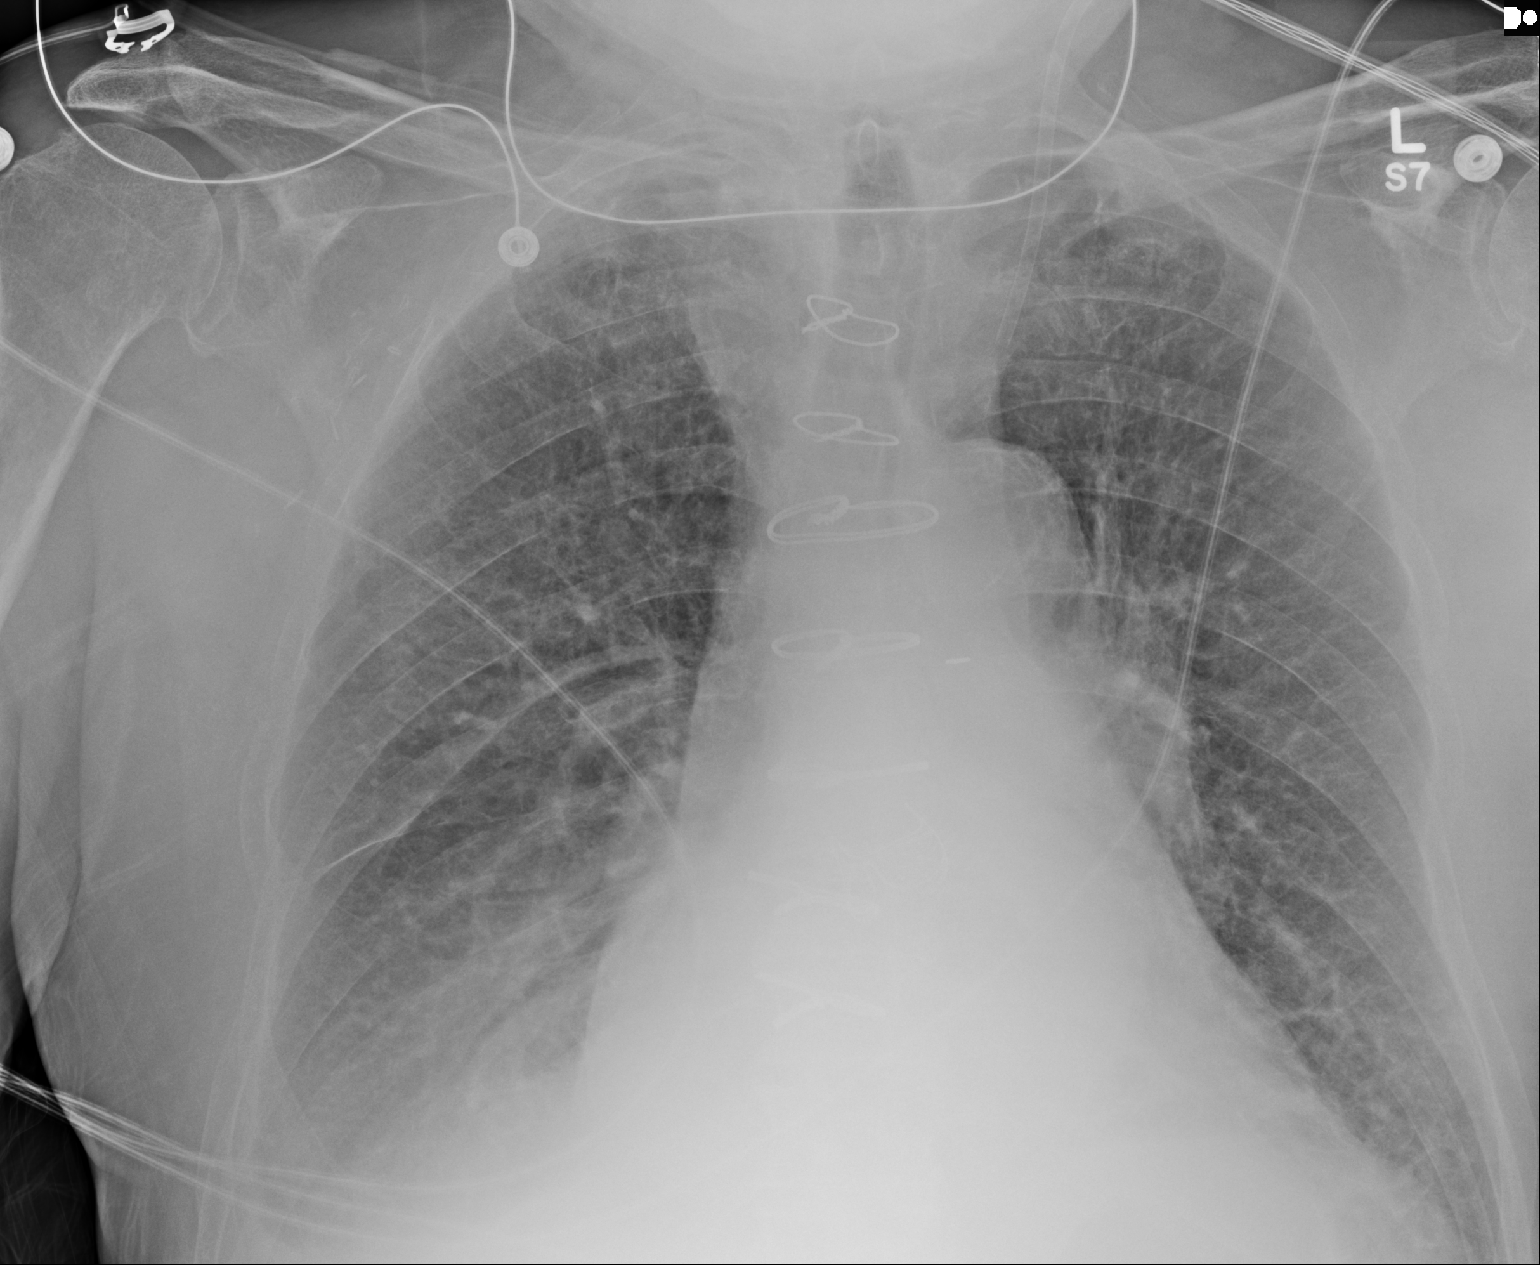

[2 of 2 positions shown; findings below may reference images not displayed]

FINDINGS: Endotracheal tube is been removed. Left IJ sheath remains in place.
Left basilar atelectasis is improved. Atelectatic change in the
right base and likely small right effusion persist. Interstitial
edema is again seen.
IMPRESSION: Status post extubation.

Improved left basilar atelectasis.  No other change.

## 2014-12-04 IMAGING — CR DG CHEST 2V
2 series · 2 of 2 positions shown · non-contrast
Comparison: DG CHEST 1V PORT dated 05/15/2013;

CLINICAL DATA: Prior catheter fragment removal .

EXAM:
CHEST  2 VIEW

[w chest pa]
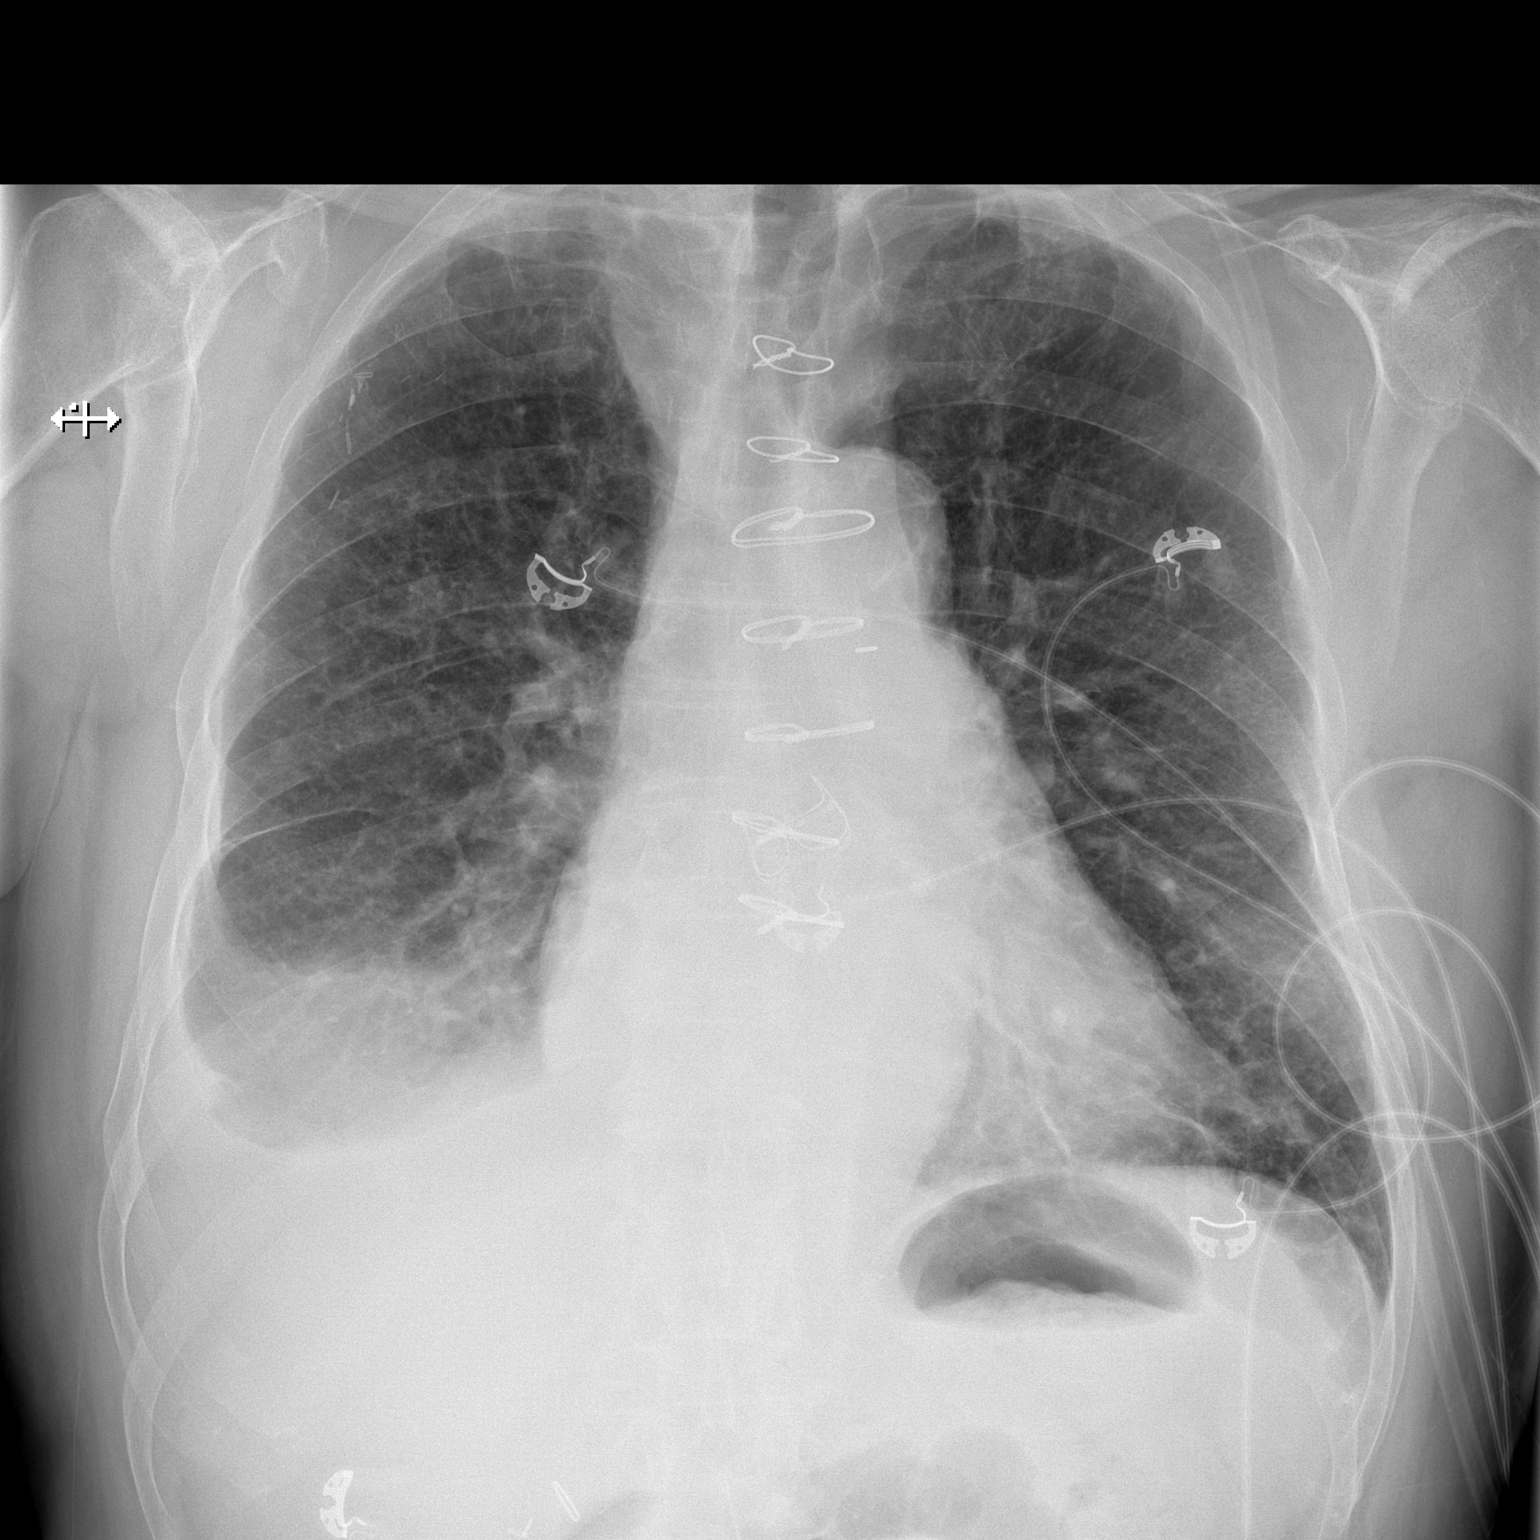

[w chest lat]
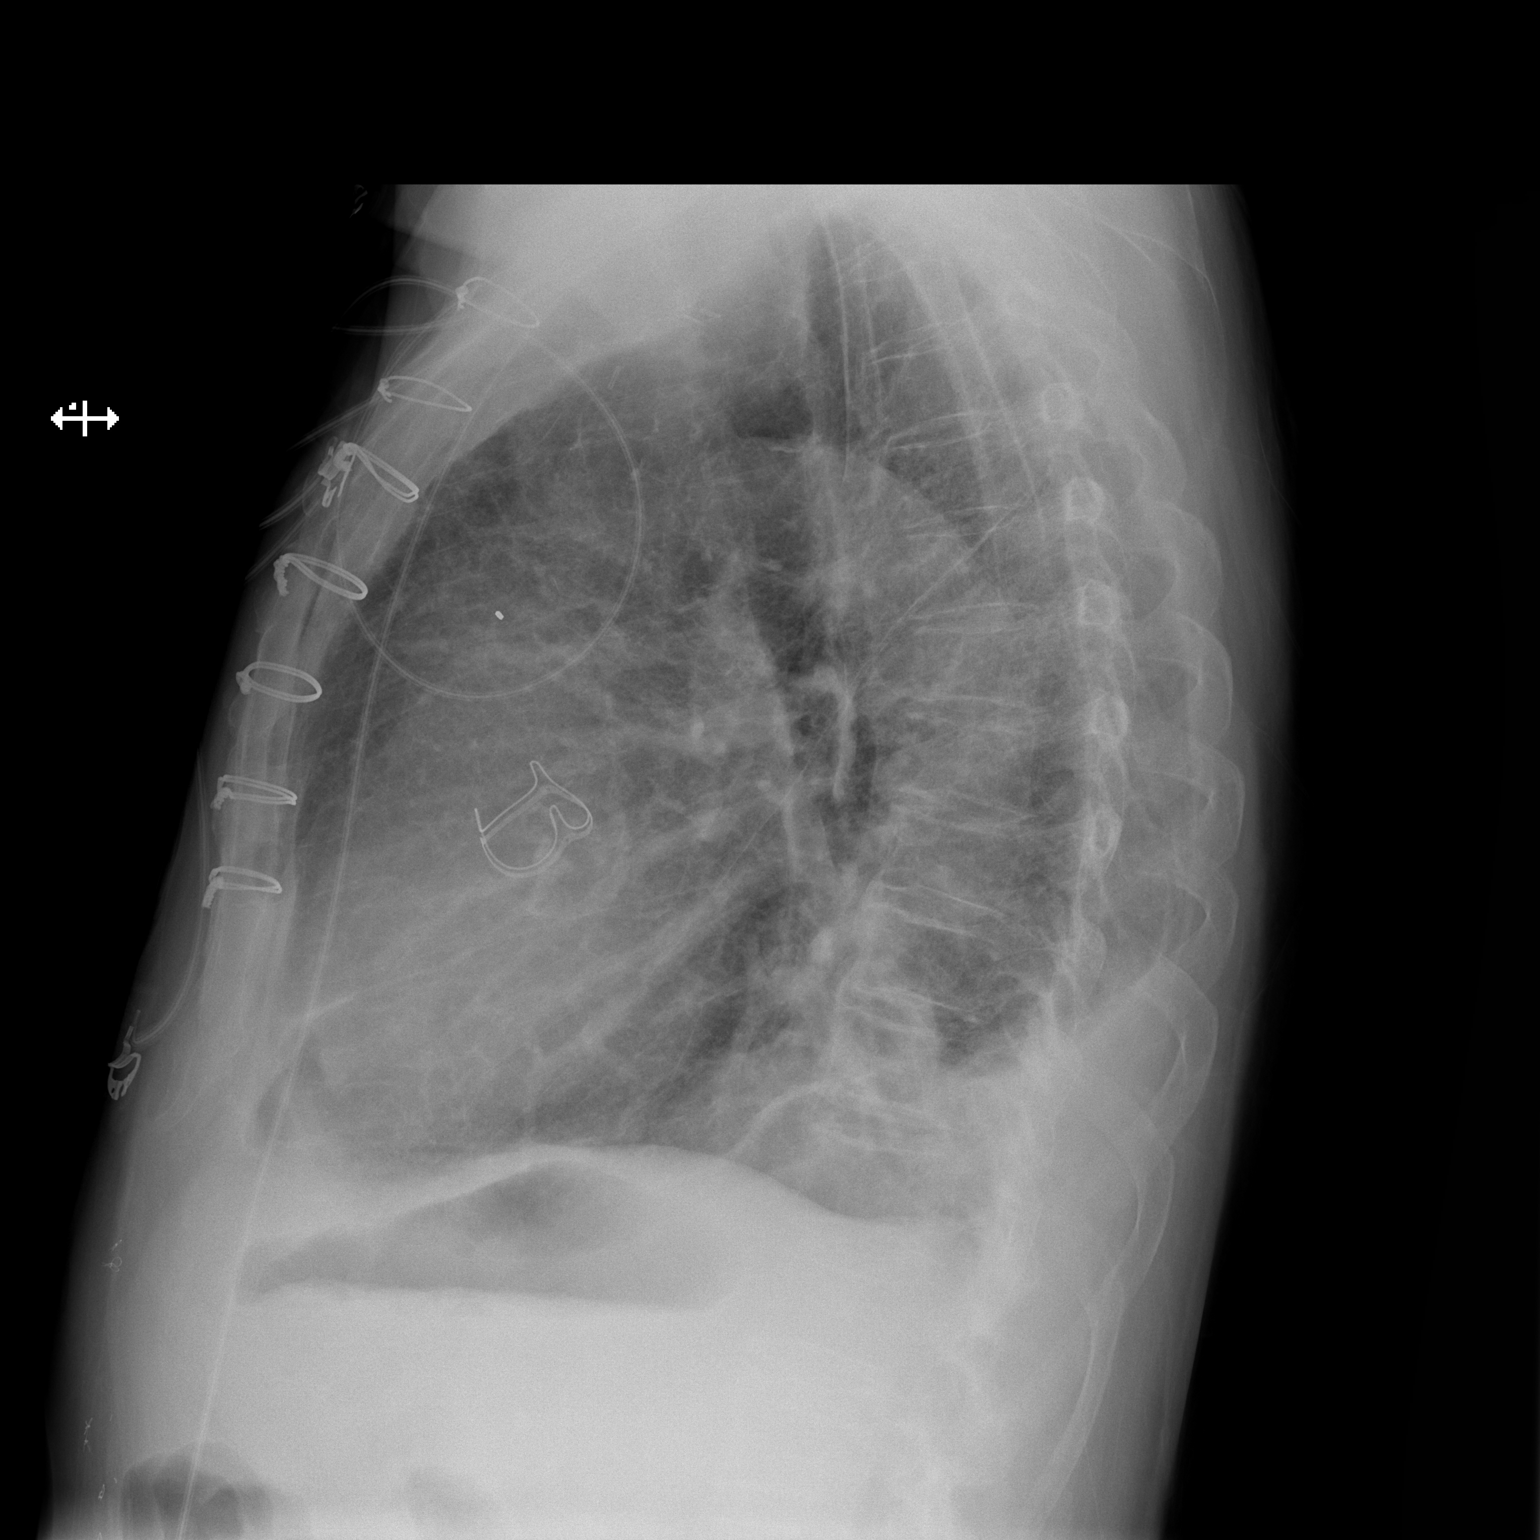

[2 of 2 positions shown; findings below may reference images not displayed]

DG CHEST 2 VIEW dated
03/14/2013; DG CHEST 2 VIEW dated 02/14/2013; DG CHEST 1 VIEW dated
05/15/2013
FINDINGS: Cardiomegaly is mild pulmonary vascular prominence and interstitial
prominence consistent congestive heart failure from interstitial
edema. Small right pleural effusion or pleural scarring noted.
Basilar atelectasis. A prior aortic valve replacement. No evidence
of a catheter fragment noted. No pneumothorax. Surgical clips noted
right chest.
IMPRESSION: 1. No evidence of catheter fragment.
2. Prior aortic valve replacement.
3. Congestive heart failure with mild interstitial edema. Small
right pleural effusion and/or pleural scarring noted.

## 2014-12-18 IMAGING — CR DG CHEST 2V
2 series · 2 of 2 positions shown · non-contrast
Comparison: DG CHEST 2 VIEW dated 05/16/2013

CLINICAL DATA: Shortness of breath history of aortic valve
replacement

EXAM:
CHEST  2 VIEW

[w chest pa]
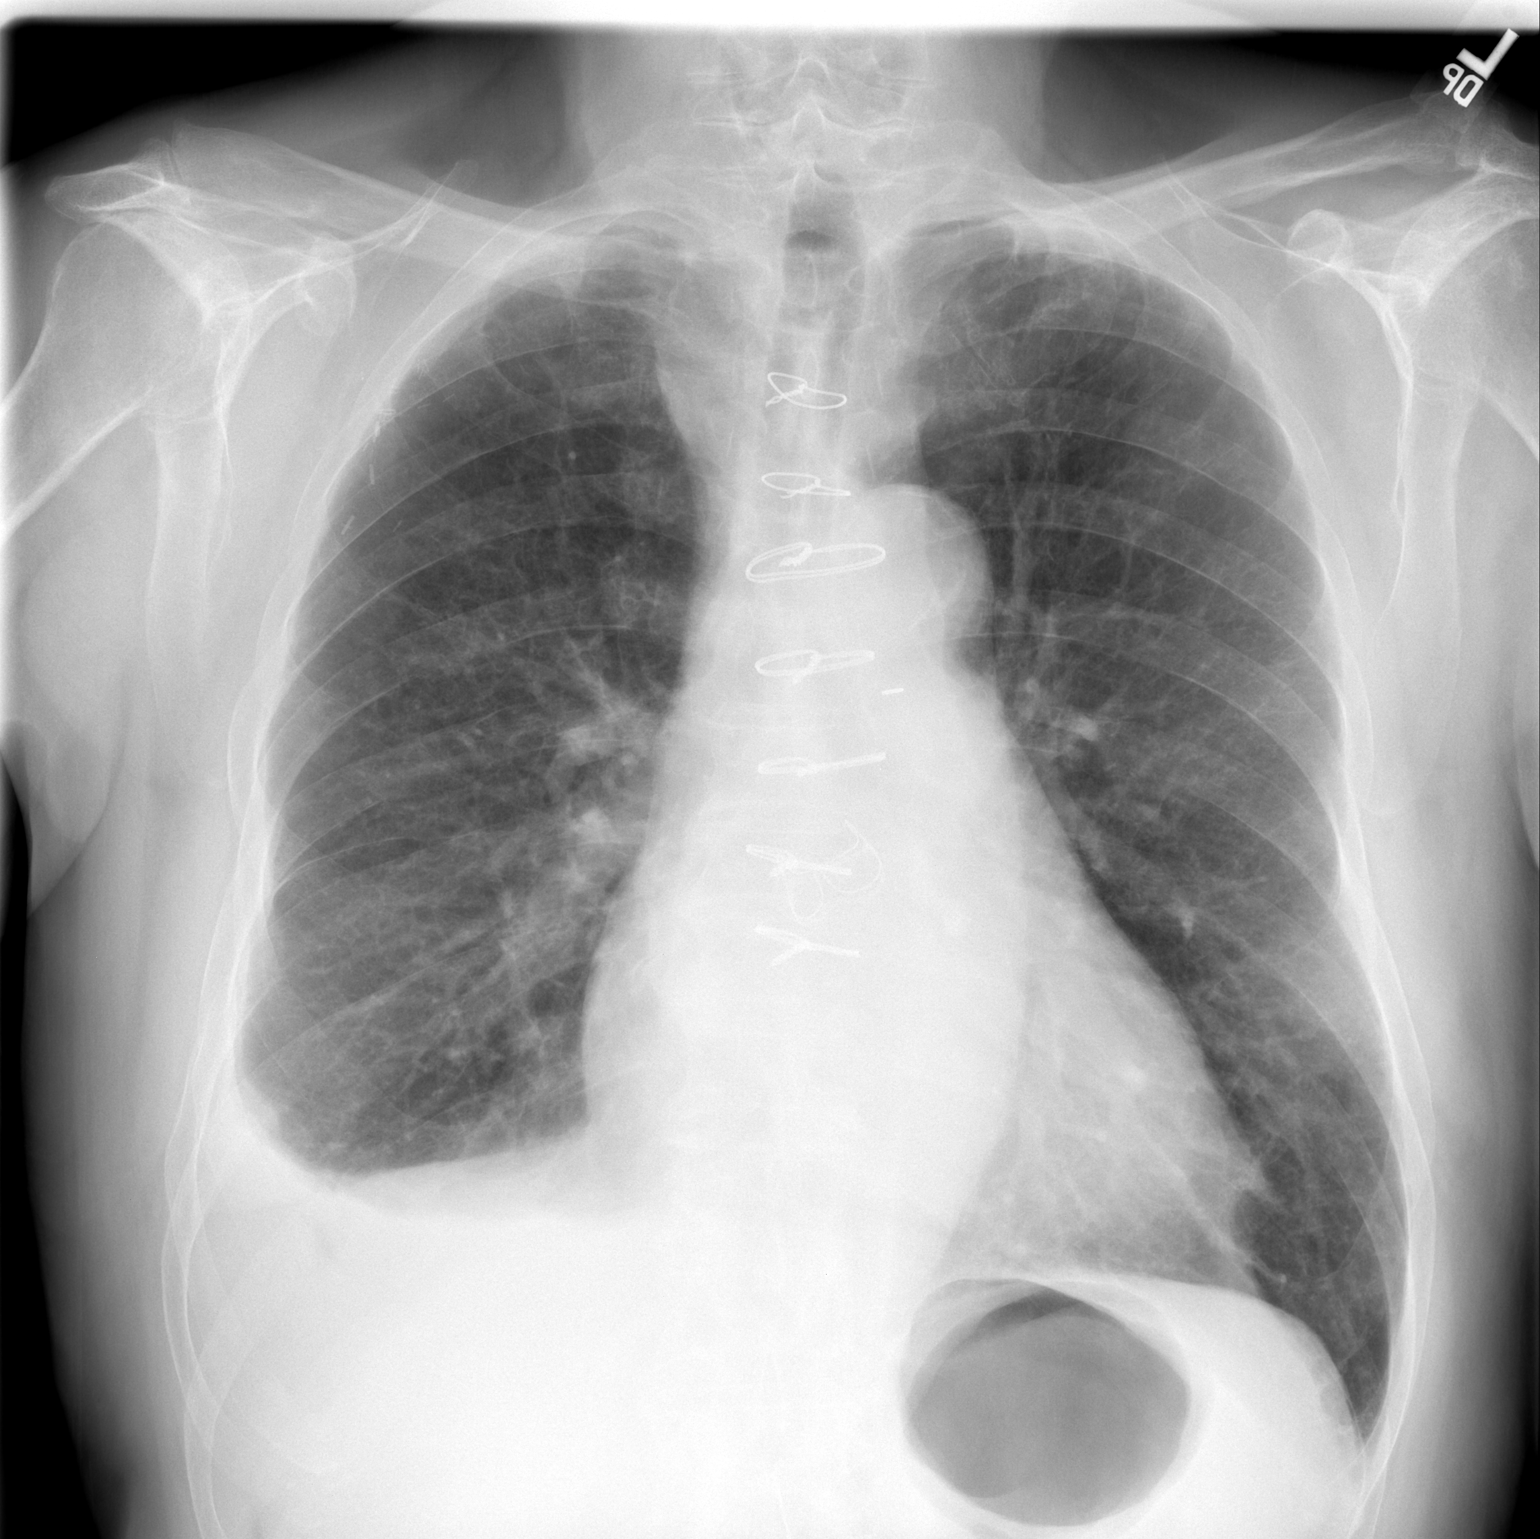

[w chest lat]
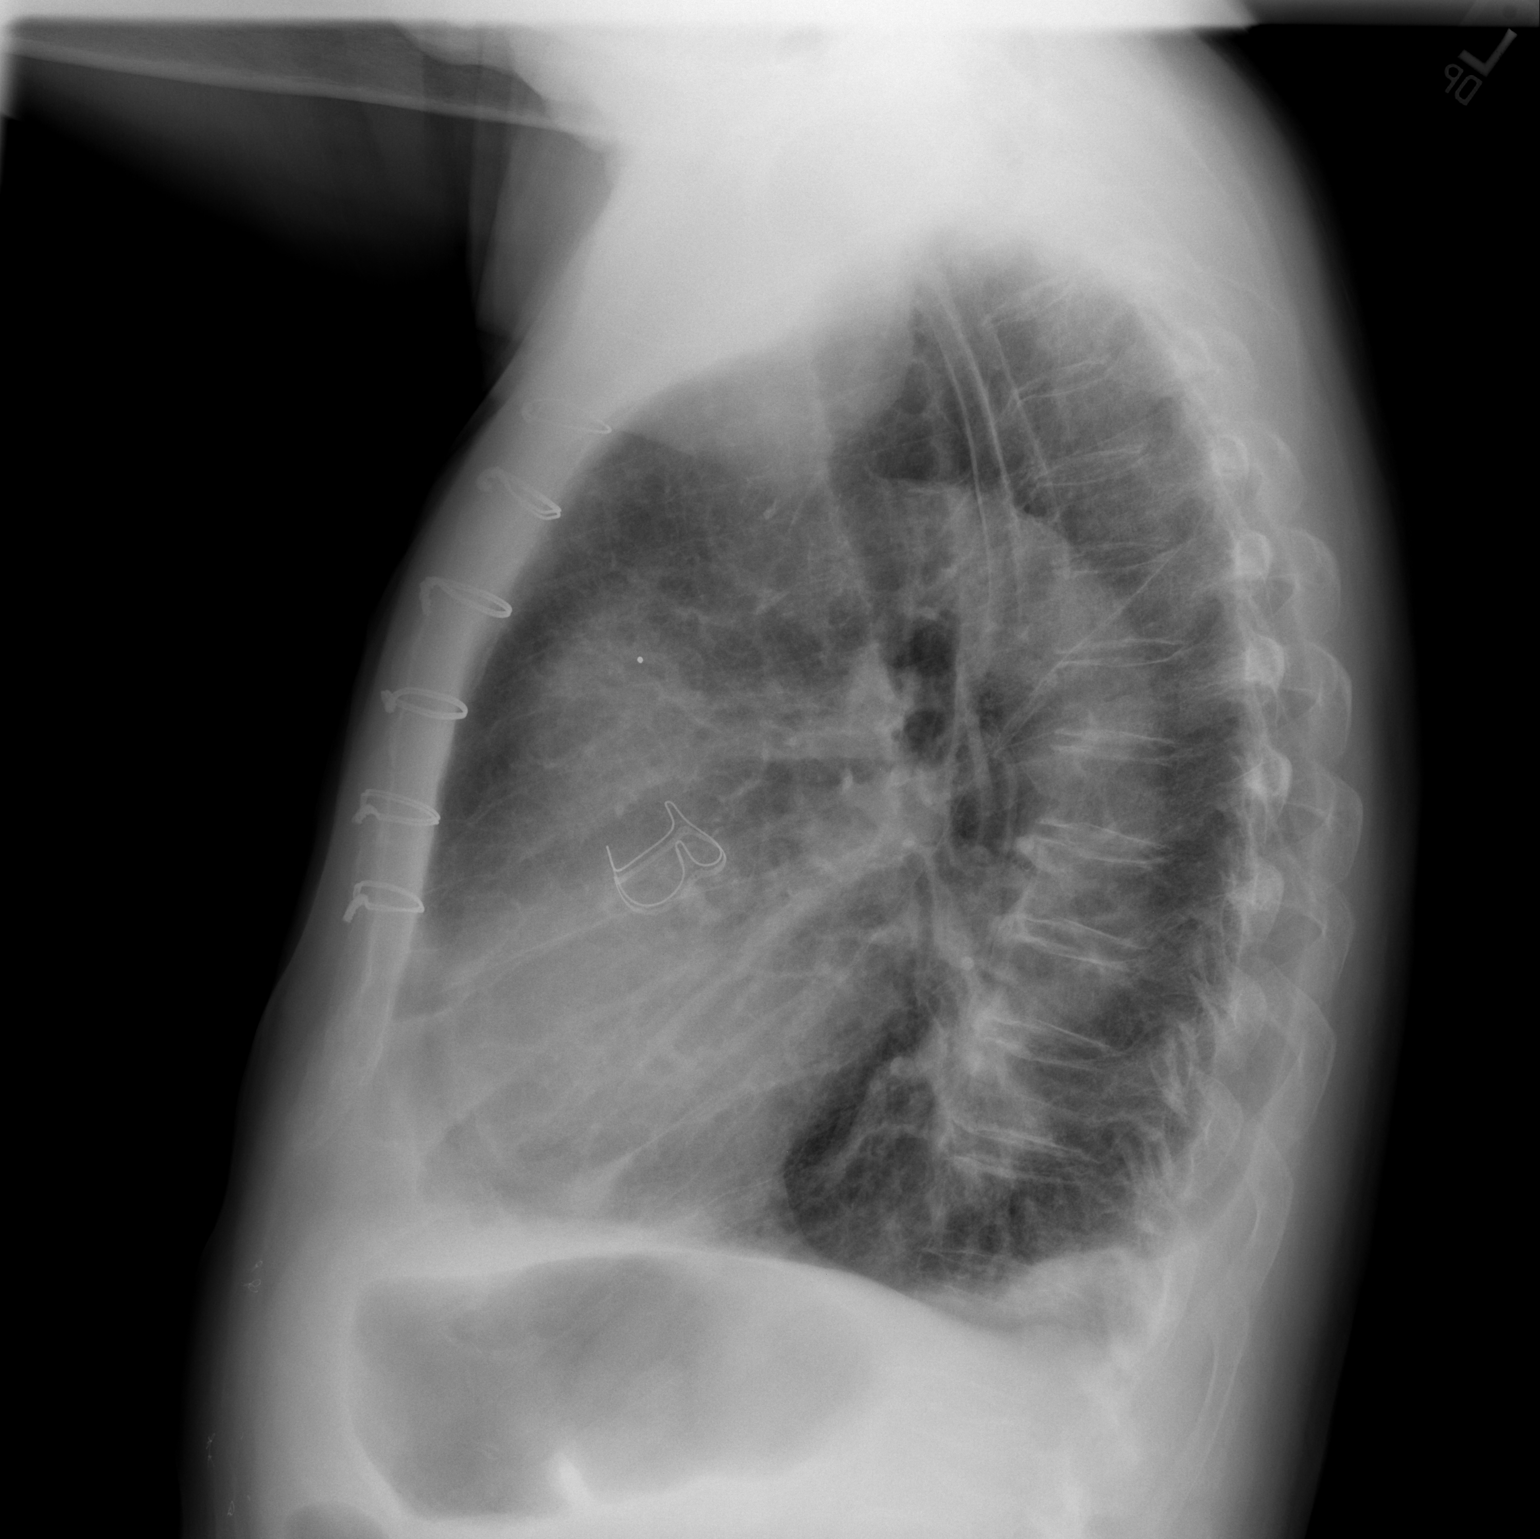

[2 of 2 positions shown; findings below may reference images not displayed]

FINDINGS: The left lung is well-expanded and clear. On the right there is
persistent blunting of the lateral and posterior costophrenic angles
consistent with a pleural effusion. There is no evidence of
interstitial or alveolar pneumonia. The cardiopericardial silhouette
is top-normal in size. The pulmonary vascularity is not engorged.
There are 7 intact sternal wires present. A prosthetic aortic valve
ring is visible. The observed portions of the bony thorax exhibit no
acute abnormalities.
IMPRESSION: The findings are consistent with COPD. A small stable right pleural
effusion versus pleural thickening is present. There is no evidence
of pneumonia nor pulmonary edema.

## 2015-05-23 IMAGING — CR DG CHEST 2V
1 series · 4 of 4 positions shown · non-contrast
Comparison: Single view of the chest 05/04/2012.

CLINICAL DATA: Cough for 1 week. Weakness shortness of breath.
Rhonchi in the left lower lobe. Smoker.

EXAM:
CHEST  2 VIEW

[Series 1: pa · 0.17mm/px · 4 of 4 slices shown]
[im 1/4]
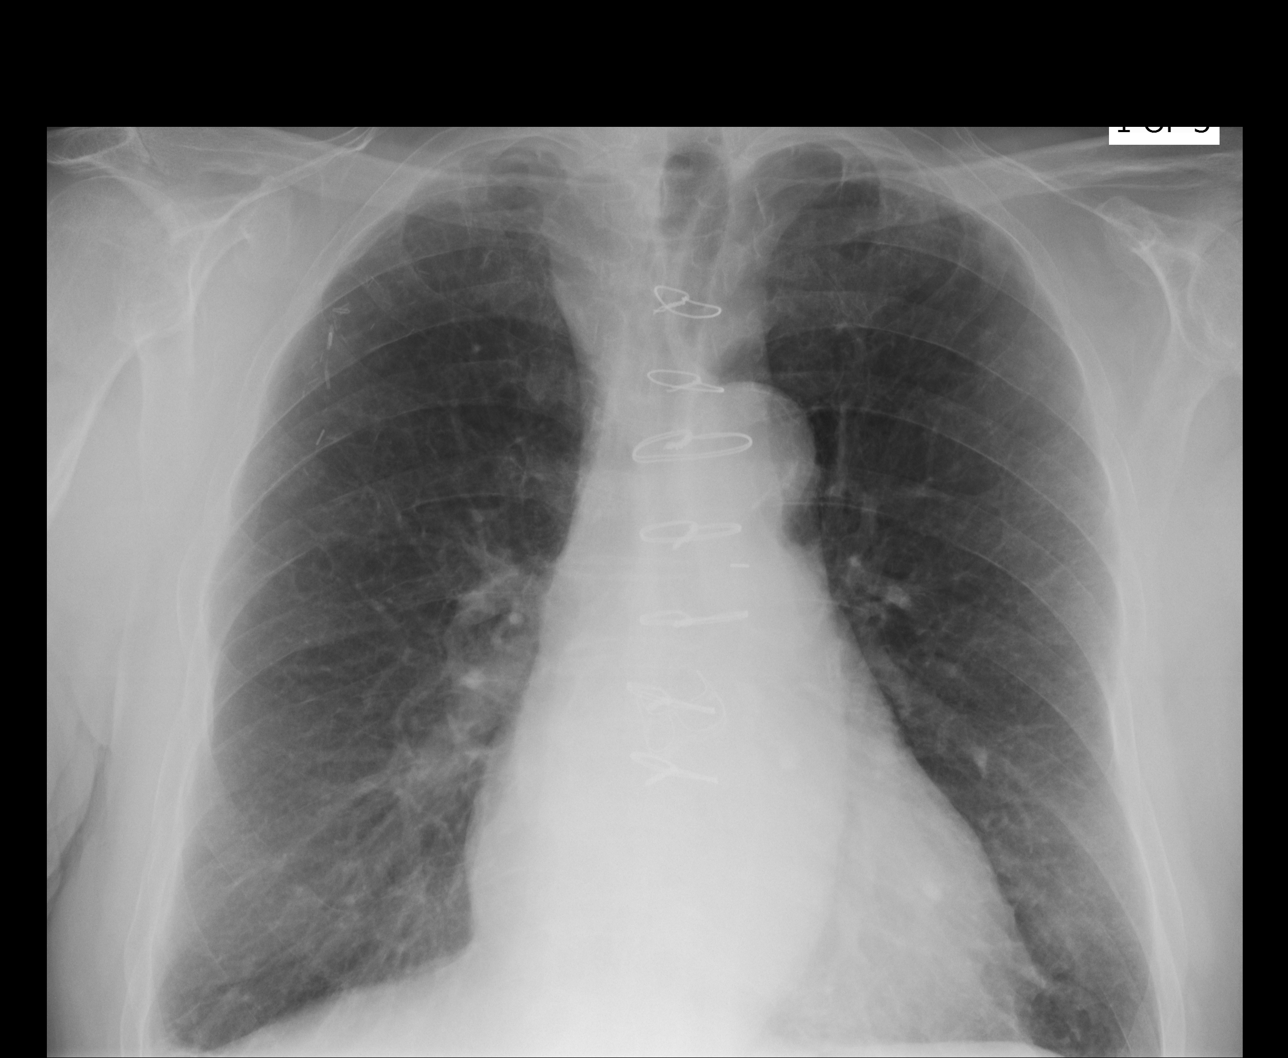
[im 2/4]
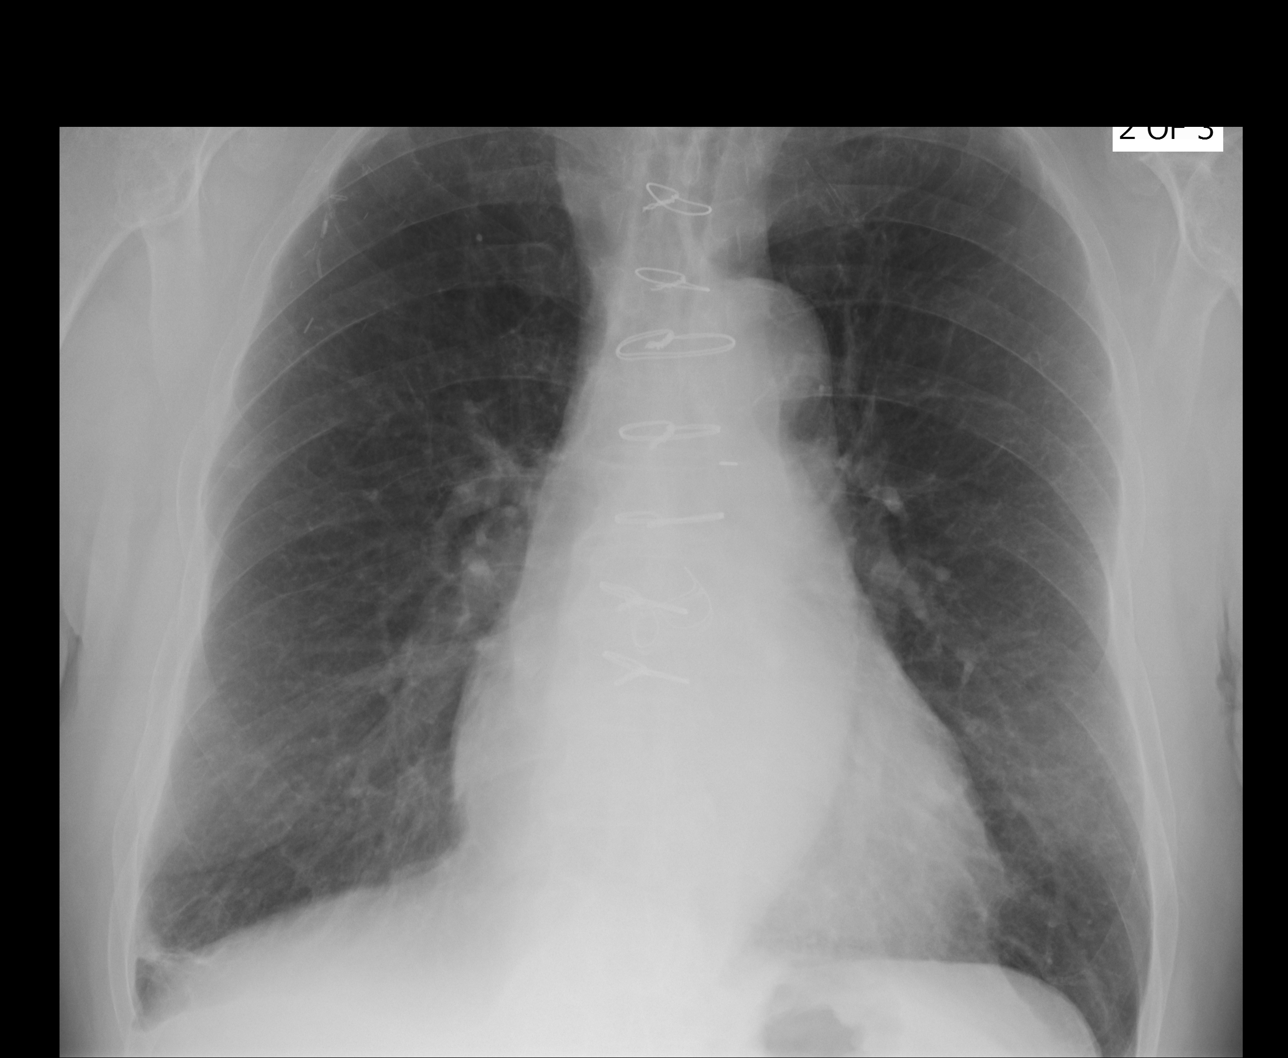
[im 3/4]
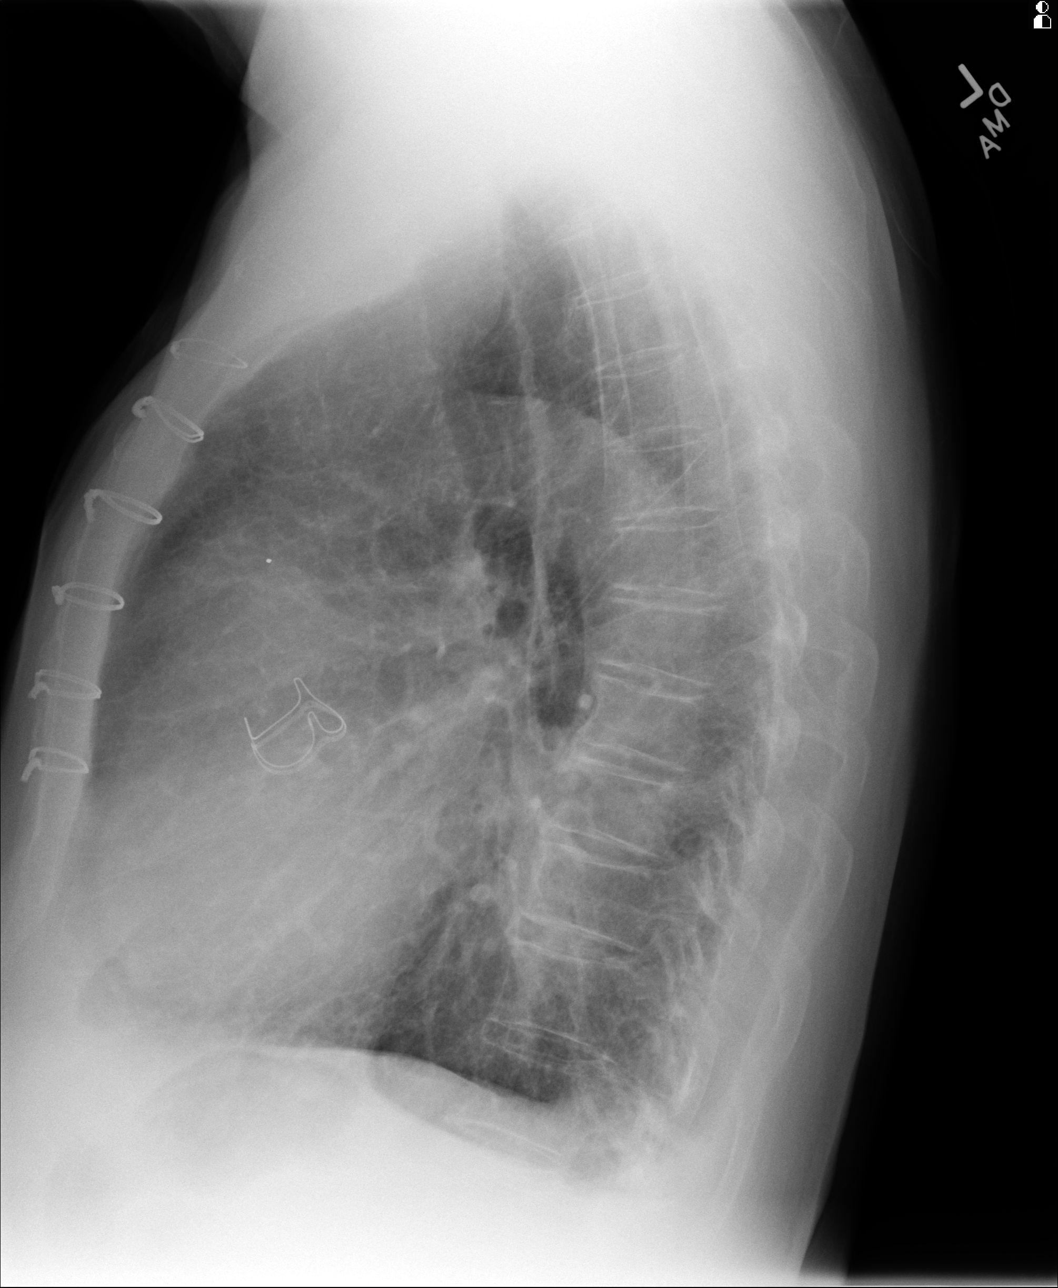
[im 4/4]
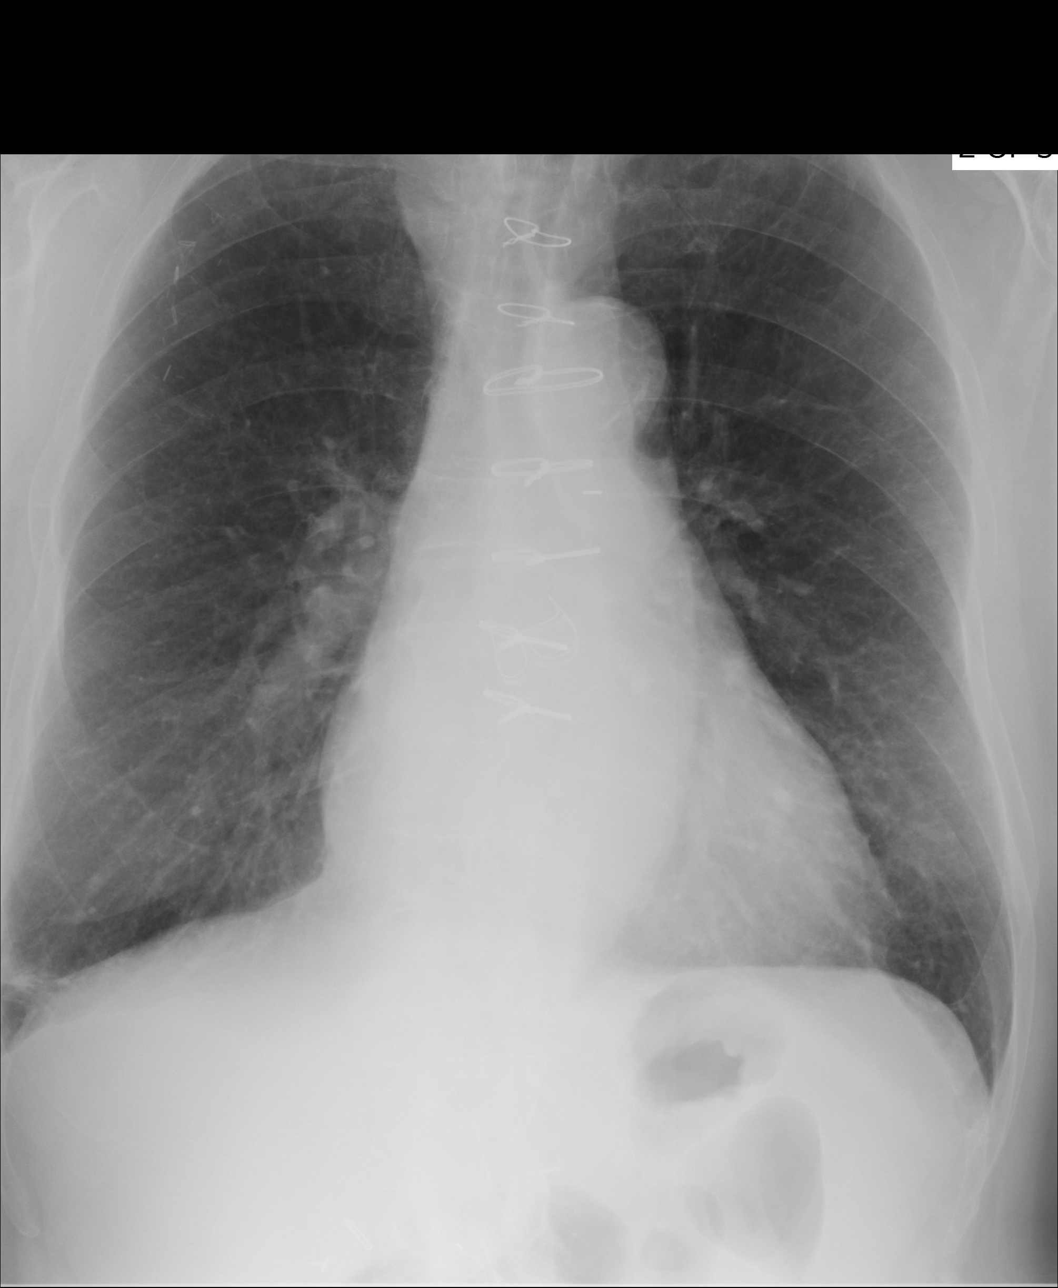

[4 of 4 positions shown; findings below may reference images not displayed]

FINDINGS: No consolidative process, pneumothorax or effusion is identified.
Interstitium appears coarsened. The chest is hyperexpanded. Small
focus of discoid atelectasis in the periphery of the right base is
noted. Heart size is upper normal. The patient is status post aortic
valve replacement and right axillary dissection.
IMPRESSION: Findings compatible with COPD.  No acute abnormality.
